# Patient Record
Sex: Male | Born: 1950 | Race: White | Hispanic: No | Marital: Married | Smoking: Former smoker
Health system: Southern US, Community
[De-identification: ages and names within clinical notes are randomized; demographics above are authoritative.]

## PROBLEM LIST (undated history)

## (undated) DIAGNOSIS — K219 Gastro-esophageal reflux disease without esophagitis: Secondary | ICD-10-CM

## (undated) DIAGNOSIS — M5126 Other intervertebral disc displacement, lumbar region: Secondary | ICD-10-CM

## (undated) DIAGNOSIS — G4730 Sleep apnea, unspecified: Secondary | ICD-10-CM

## (undated) DIAGNOSIS — I10 Essential (primary) hypertension: Secondary | ICD-10-CM

## (undated) DIAGNOSIS — Z8719 Personal history of other diseases of the digestive system: Secondary | ICD-10-CM

## (undated) DIAGNOSIS — M549 Dorsalgia, unspecified: Secondary | ICD-10-CM

## (undated) DIAGNOSIS — M159 Polyosteoarthritis, unspecified: Secondary | ICD-10-CM

## (undated) DIAGNOSIS — E785 Hyperlipidemia, unspecified: Secondary | ICD-10-CM

## (undated) DIAGNOSIS — C801 Malignant (primary) neoplasm, unspecified: Secondary | ICD-10-CM

## (undated) HISTORY — DX: Other intervertebral disc displacement, lumbar region: M51.26

## (undated) HISTORY — PX: PROSTATE BIOPSY: SHX241

## (undated) HISTORY — PX: APPENDECTOMY: SHX54

## (undated) HISTORY — PX: REPLACEMENT TOTAL KNEE: SUR1224

## (undated) HISTORY — DX: Polyosteoarthritis, unspecified: M15.9

## (undated) HISTORY — DX: Hyperlipidemia, unspecified: E78.5

## (undated) HISTORY — DX: Essential (primary) hypertension: I10

---

## 2000-09-25 HISTORY — PX: REPLACEMENT TOTAL KNEE: SUR1224

## 2002-06-12 ENCOUNTER — Encounter: Payer: Self-pay | Admitting: Orthopedic Surgery

## 2002-06-16 ENCOUNTER — Inpatient Hospital Stay (HOSPITAL_COMMUNITY): Admission: RE | Admit: 2002-06-16 | Discharge: 2002-06-18 | Payer: Self-pay | Admitting: Orthopedic Surgery

## 2003-06-29 ENCOUNTER — Inpatient Hospital Stay (HOSPITAL_COMMUNITY): Admission: EM | Admit: 2003-06-29 | Discharge: 2003-06-30 | Payer: Self-pay | Admitting: Emergency Medicine

## 2003-06-29 ENCOUNTER — Encounter: Payer: Self-pay | Admitting: *Deleted

## 2003-06-30 ENCOUNTER — Encounter: Payer: Self-pay | Admitting: *Deleted

## 2003-08-17 ENCOUNTER — Encounter (INDEPENDENT_AMBULATORY_CARE_PROVIDER_SITE_OTHER): Payer: Self-pay | Admitting: *Deleted

## 2003-08-17 ENCOUNTER — Ambulatory Visit (HOSPITAL_COMMUNITY): Admission: RE | Admit: 2003-08-17 | Discharge: 2003-08-17 | Payer: Self-pay | Admitting: *Deleted

## 2008-09-23 ENCOUNTER — Inpatient Hospital Stay (HOSPITAL_COMMUNITY): Admission: RE | Admit: 2008-09-23 | Discharge: 2008-09-25 | Payer: Self-pay | Admitting: Orthopedic Surgery

## 2009-08-09 ENCOUNTER — Ambulatory Visit (HOSPITAL_COMMUNITY): Admission: RE | Admit: 2009-08-09 | Discharge: 2009-08-09 | Payer: Self-pay | Admitting: Orthopedic Surgery

## 2009-08-12 ENCOUNTER — Ambulatory Visit: Admission: RE | Admit: 2009-08-12 | Discharge: 2009-08-12 | Payer: Self-pay | Admitting: Orthopedic Surgery

## 2009-08-30 ENCOUNTER — Inpatient Hospital Stay (HOSPITAL_COMMUNITY): Admission: RE | Admit: 2009-08-30 | Discharge: 2009-09-01 | Payer: Self-pay | Admitting: Orthopedic Surgery

## 2009-08-30 ENCOUNTER — Encounter (INDEPENDENT_AMBULATORY_CARE_PROVIDER_SITE_OTHER): Payer: Self-pay | Admitting: Orthopedic Surgery

## 2009-11-11 IMAGING — CR DG KNEE 1-2V PORT*R*
2 series · 2 of 2 positions shown · non-contrast
Comparison: None

CLINICAL DATA: Postop right knee replacement.

PORTABLE RIGHT KNEE - 1-2 VIEW

[view not recorded (1 of 2)]
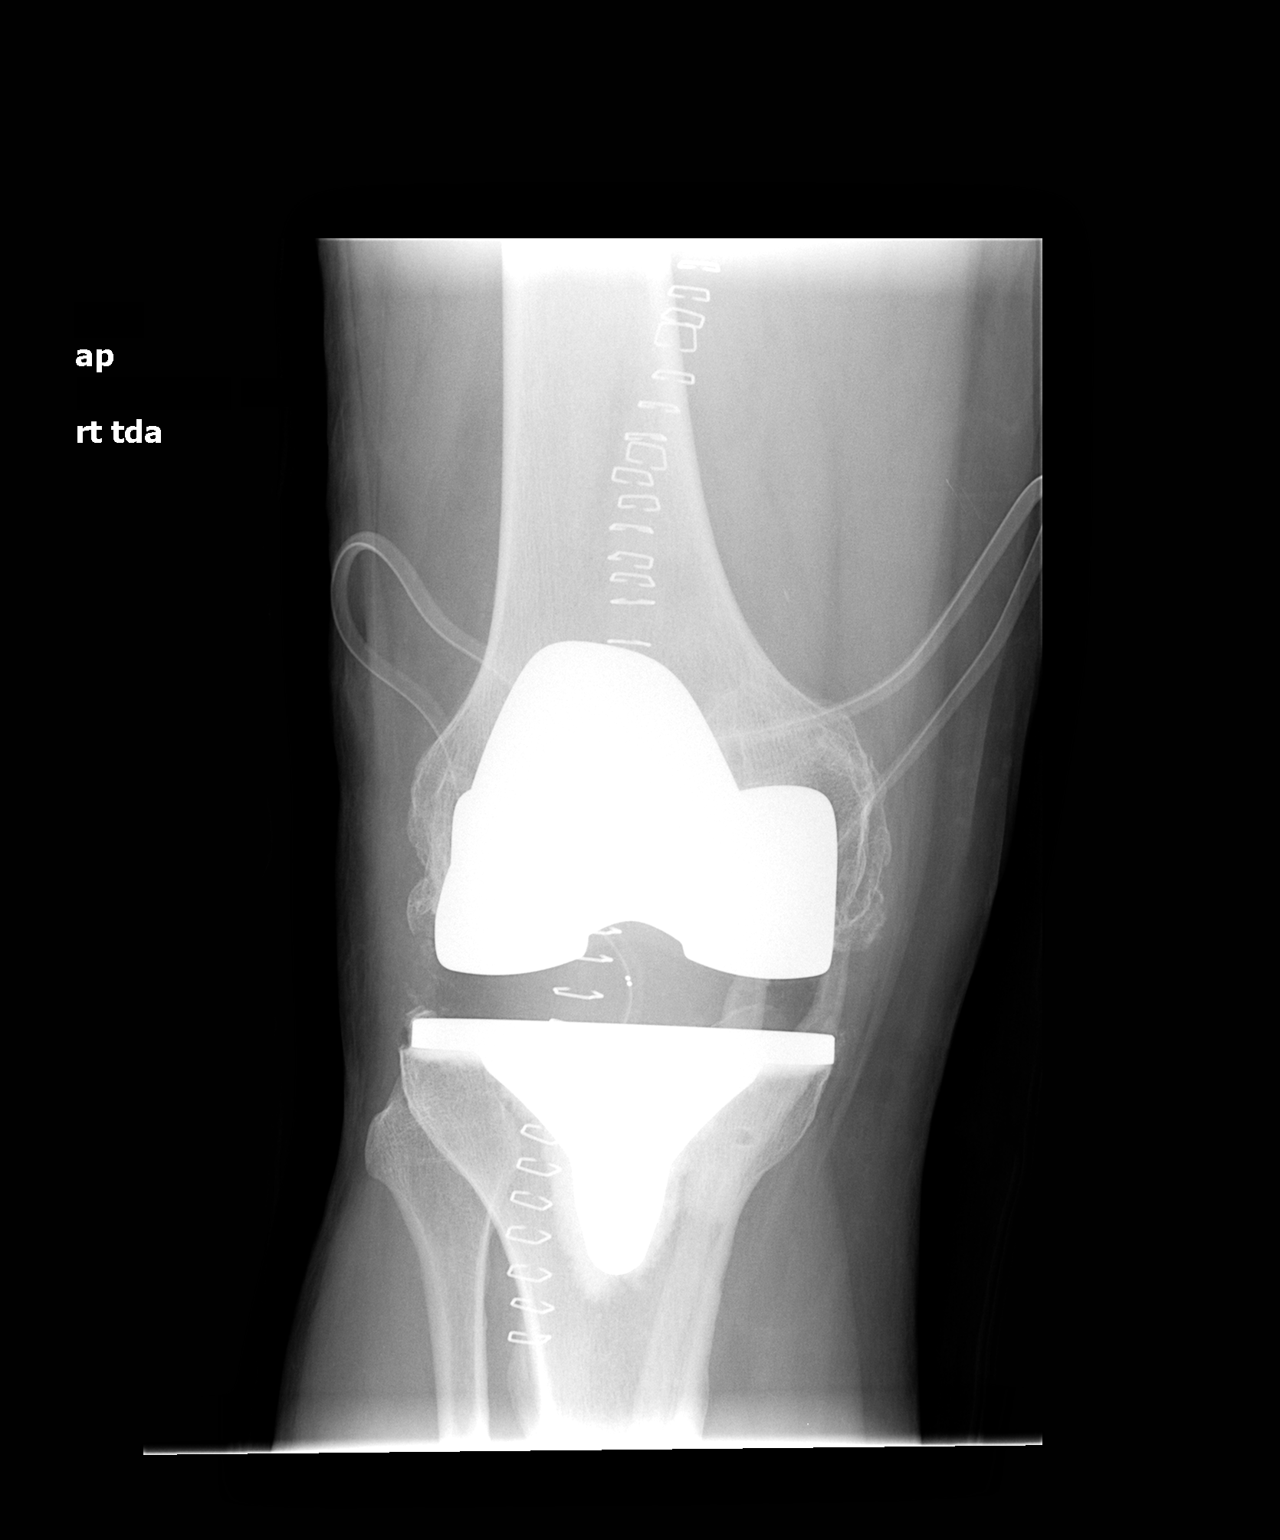

[view not recorded (2 of 2)]
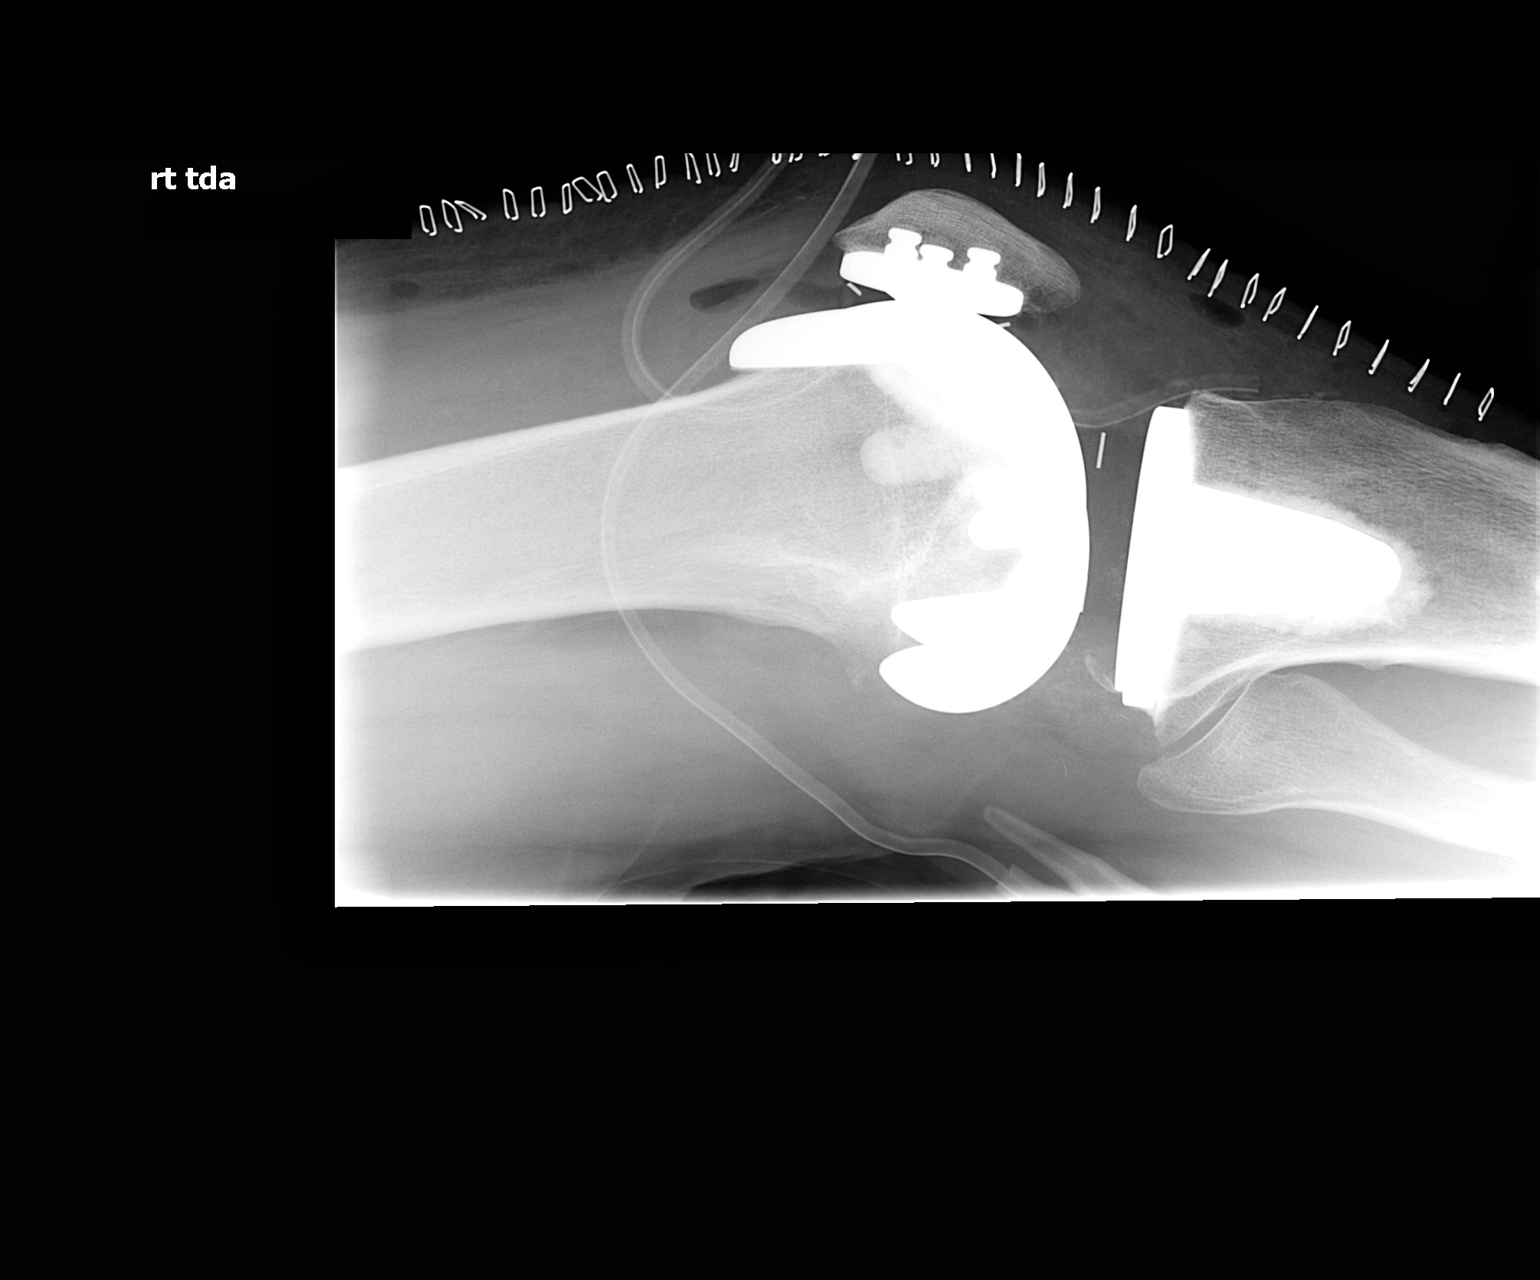

[2 of 2 positions shown; findings below may reference images not displayed]

FINDINGS: The patient is status post right total knee replacement.
No hardware or bony complicating feature.  Soft tissue drain in
place.  There is intrarticular gas noted.
IMPRESSION: Right knee replacement without complicating feature.

## 2009-11-11 IMAGING — CR DG CHEST 2V
2 series · 2 of 2 positions shown · non-contrast
Comparison: None

CLINICAL DATA: Osteoarthritis right knee, preop replacement.

CHEST - 2 VIEW

[view not recorded (1 of 2)]
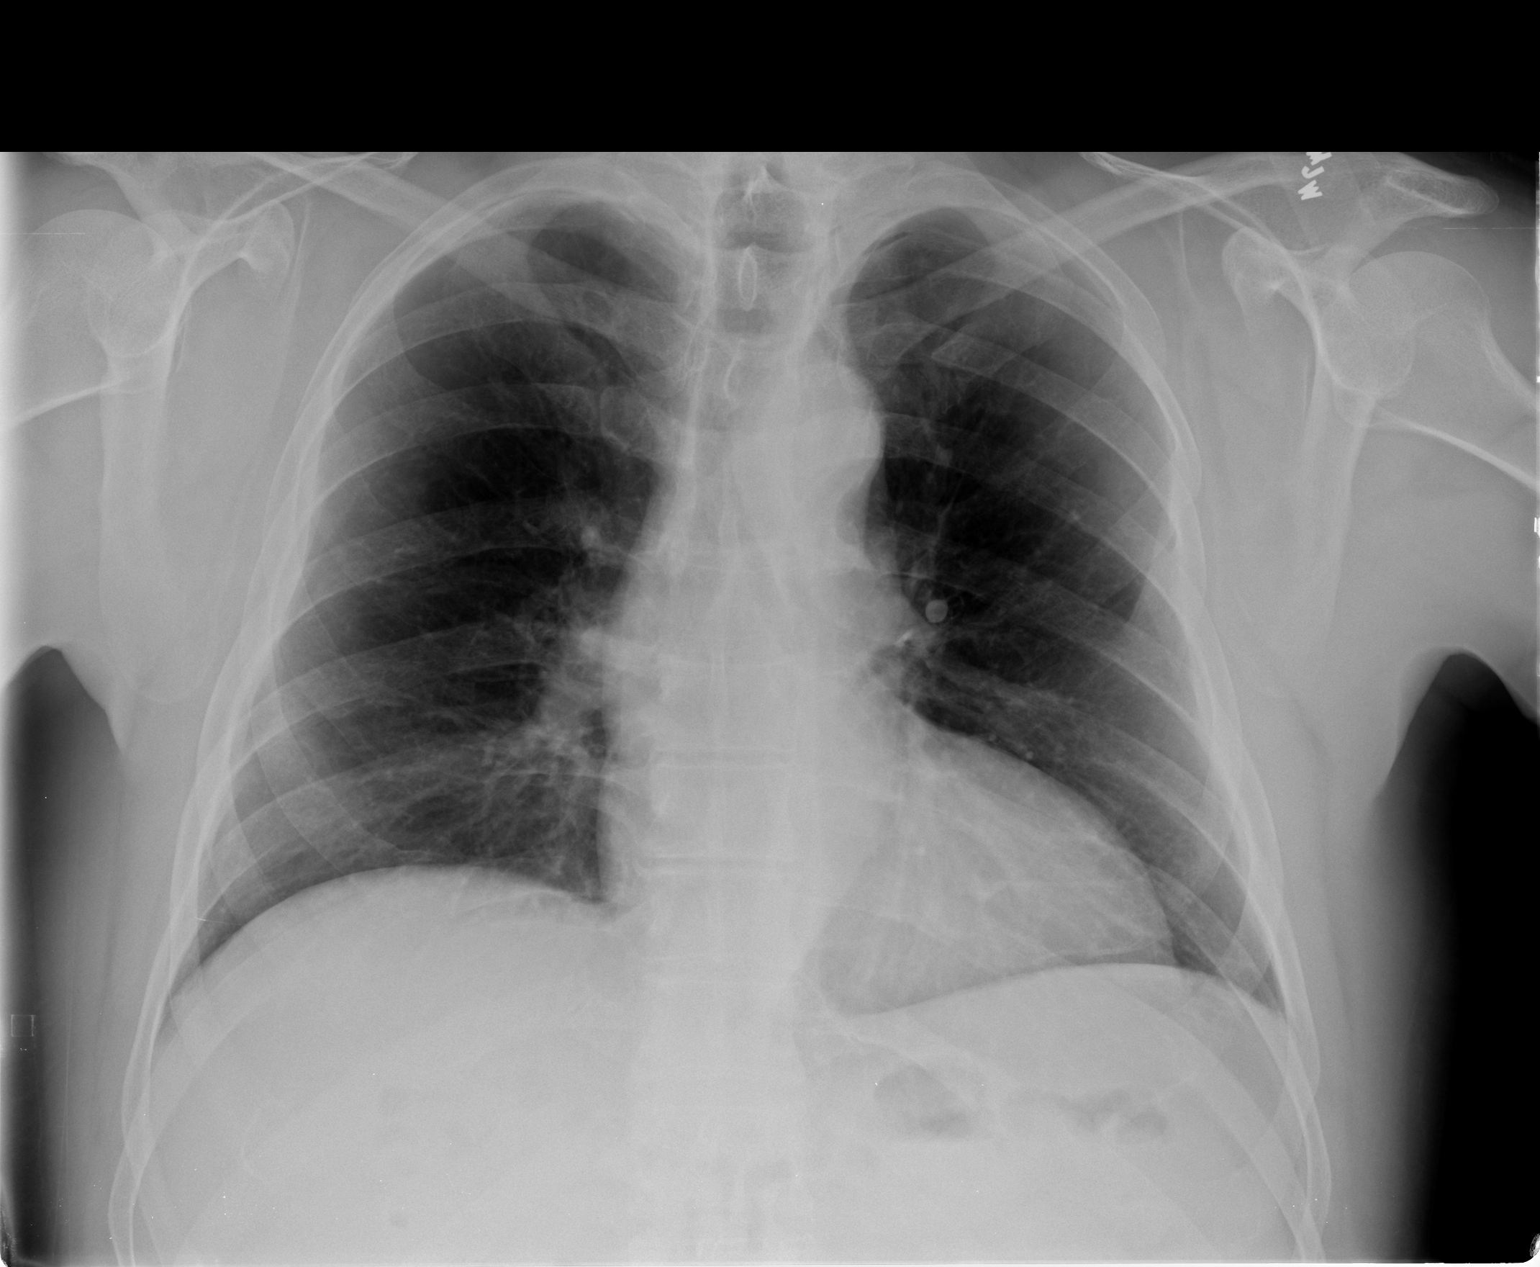

[view not recorded (2 of 2)]
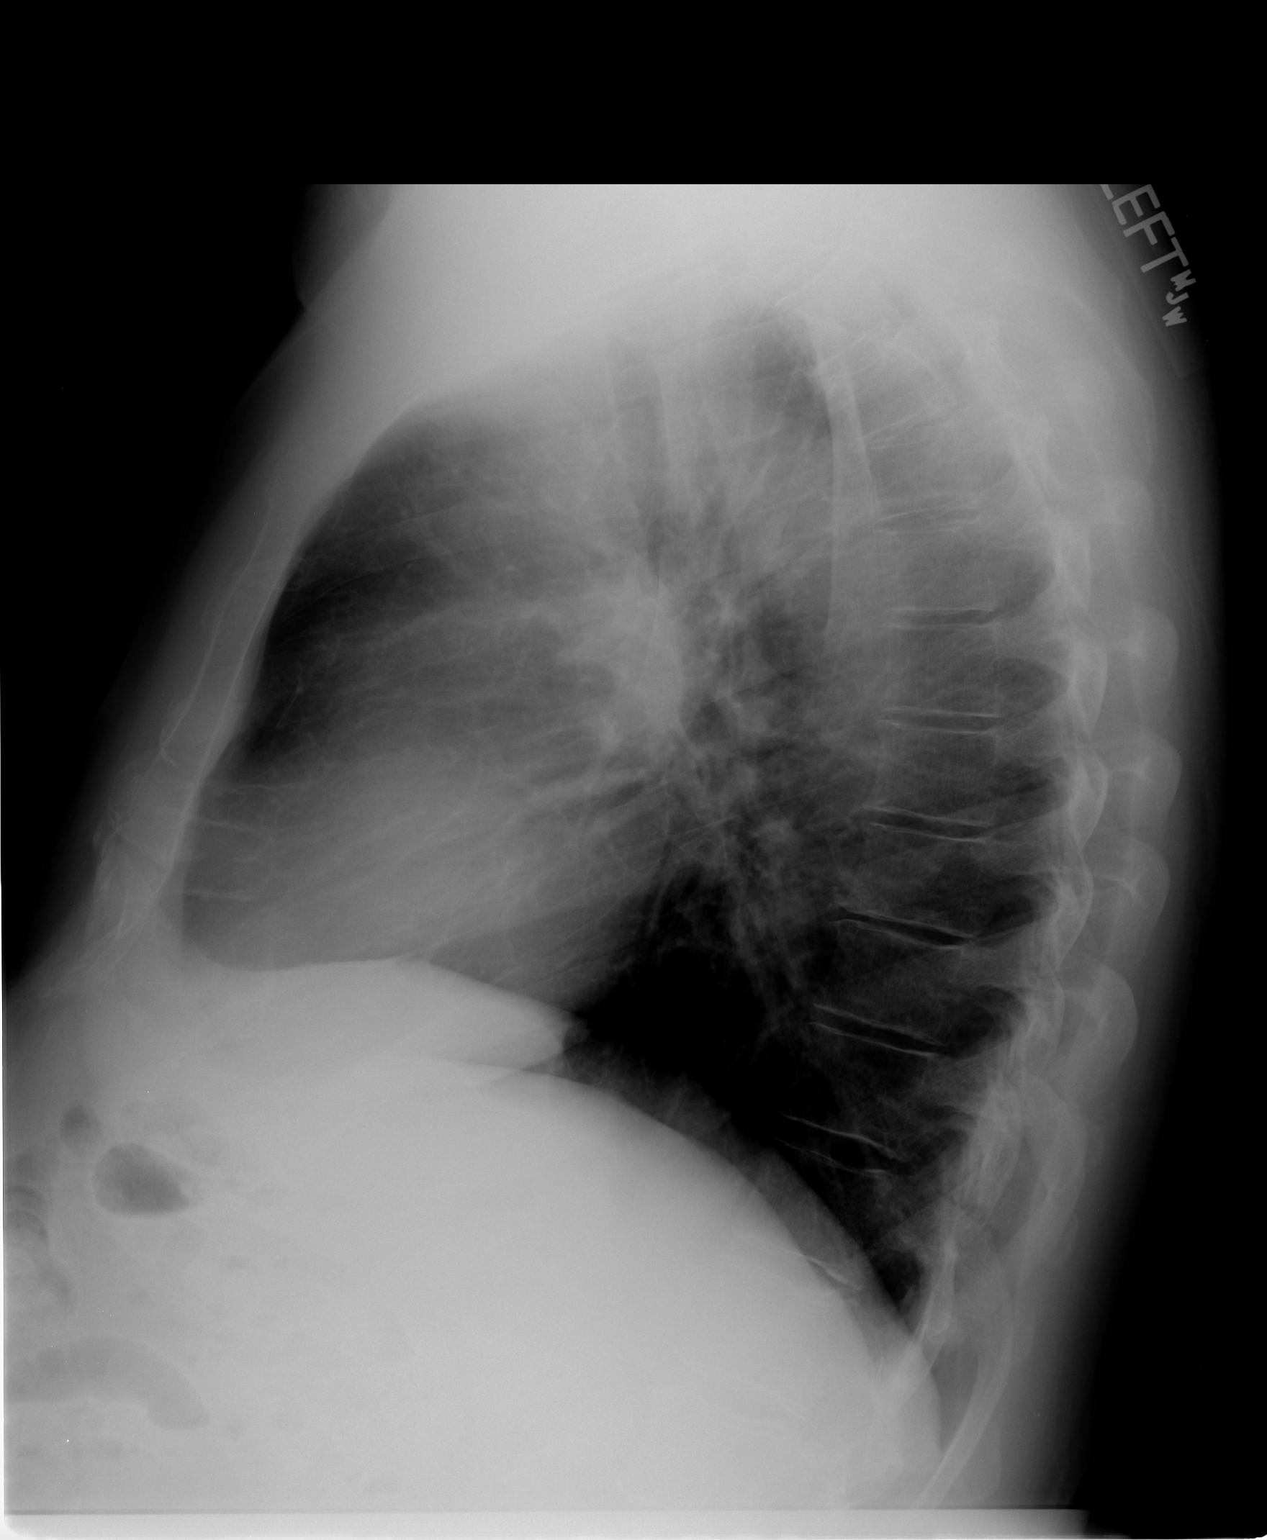

[2 of 2 positions shown; findings below may reference images not displayed]

FINDINGS: Heart and mediastinal contours are within normal limits.
No focal opacities or effusions.  No acute bony abnormality.
IMPRESSION: No active disease.

## 2010-09-25 HISTORY — PX: BASAL CELL CARCINOMA EXCISION: SHX1214

## 2010-12-27 LAB — COMPREHENSIVE METABOLIC PANEL
ALT: 44 U/L (ref 0–53)
AST: 30 U/L (ref 0–37)
Albumin: 4.3 g/dL (ref 3.5–5.2)
CO2: 29 mEq/L (ref 19–32)
Calcium: 9.6 mg/dL (ref 8.4–10.5)
GFR calc Af Amer: >60 mL/min (ref >60)
GFR calc non Af Amer: >60 mL/min (ref >60)
Sodium: 136 mEq/L (ref 135–145)
Total Protein: 7.3 g/dL (ref 6.0–8.3)

## 2010-12-27 LAB — CBC
Hemoglobin: 11.8 g/dL — ABNORMAL LOW (ref 13.0–17.0)
MCHC: 35.1 g/dL (ref 30.0–36.0)
MCHC: 35.8 g/dL (ref 30.0–36.0)
MCV: 87.1 fL (ref 78.0–100.0)
MCV: 88.3 fL (ref 78.0–100.0)
Platelets: 178 10*3/uL (ref 150–400)
Platelets: 217 10*3/uL (ref 150–400)
RBC: 3.78 MIL/uL — ABNORMAL LOW (ref 4.22–5.81)
RBC: 3.91 MIL/uL — ABNORMAL LOW (ref 4.22–5.81)
RBC: 5.41 MIL/uL (ref 4.22–5.81)
WBC: 8 10*3/uL (ref 4.0–10.5)
WBC: 8.3 10*3/uL (ref 4.0–10.5)

## 2010-12-27 LAB — URINE CULTURE: Culture: NO GROWTH

## 2010-12-27 LAB — BODY FLUID CULTURE: Culture: NO GROWTH

## 2010-12-27 LAB — URINALYSIS, ROUTINE W REFLEX MICROSCOPIC
Glucose, UA: NEGATIVE mg/dL
Hgb urine dipstick: NEGATIVE
Specific Gravity, Urine: 1.016 (ref 1.005–1.030)

## 2010-12-27 LAB — TISSUE CULTURE: Culture: NO GROWTH

## 2010-12-27 LAB — ANAEROBIC CULTURE

## 2010-12-27 LAB — DIFFERENTIAL
Eosinophils Absolute: 0.2 10*3/uL (ref 0.0–0.7)
Eosinophils Relative: 2 % (ref 0–5)
Lymphs Abs: 1.5 10*3/uL (ref 0.7–4.0)
Monocytes Absolute: 0.6 10*3/uL (ref 0.1–1.0)
Monocytes Relative: 8 % (ref 3–12)

## 2010-12-27 LAB — CROSSMATCH

## 2010-12-27 LAB — BASIC METABOLIC PANEL
BUN: 11 mg/dL (ref 6–23)
CO2: 30 mEq/L (ref 19–32)
Calcium: 8.2 mg/dL — ABNORMAL LOW (ref 8.4–10.5)
Chloride: 98 mEq/L (ref 96–112)
Chloride: 99 mEq/L (ref 96–112)
Creatinine, Ser: 1.01 mg/dL (ref 0.4–1.5)
Creatinine, Ser: 1.19 mg/dL (ref 0.4–1.5)
GFR calc Af Amer: >60 mL/min (ref >60)
GFR calc Af Amer: >60 mL/min (ref >60)
GFR calc non Af Amer: >60 mL/min (ref >60)
Sodium: 136 mEq/L (ref 135–145)

## 2010-12-27 LAB — SYNOVIAL CELL COUNT + DIFF, W/ CRYSTALS
Crystals, Fluid: NONE SEEN
Lymphocytes-Synovial Fld: 26 % — ABNORMAL HIGH (ref 0–20)

## 2010-12-27 LAB — GRAM STAIN

## 2010-12-28 LAB — URINALYSIS, ROUTINE W REFLEX MICROSCOPIC
Ketones, ur: NEGATIVE mg/dL
Nitrite: NEGATIVE
Protein, ur: NEGATIVE mg/dL
Urobilinogen, UA: 0.2 mg/dL (ref 0.0–1.0)

## 2010-12-28 LAB — PROTIME-INR
INR: 0.99 (ref 0.00–1.49)
Prothrombin Time: 13 seconds (ref 11.6–15.2)

## 2010-12-28 LAB — COMPREHENSIVE METABOLIC PANEL
BUN: 9 mg/dL (ref 6–23)
CO2: 27 mEq/L (ref 19–32)
Calcium: 9.6 mg/dL (ref 8.4–10.5)
Chloride: 102 mEq/L (ref 96–112)
Creatinine, Ser: 1 mg/dL (ref 0.4–1.5)
GFR calc non Af Amer: >60 mL/min (ref >60)
Total Bilirubin: 0.8 mg/dL (ref 0.3–1.2)

## 2010-12-28 LAB — DIFFERENTIAL
Basophils Absolute: 0 10*3/uL (ref 0.0–0.1)
Lymphocytes Relative: 21 % (ref 12–46)
Lymphs Abs: 1.1 10*3/uL (ref 0.7–4.0)
Neutro Abs: 3.7 10*3/uL (ref 1.7–7.7)

## 2010-12-28 LAB — CBC
HCT: 46 % (ref 39.0–52.0)
MCHC: 35.5 g/dL (ref 30.0–36.0)
MCV: 87.5 fL (ref 78.0–100.0)
Platelets: 178 10*3/uL (ref 150–400)
RBC: 5.26 MIL/uL (ref 4.22–5.81)
WBC: 5.5 10*3/uL (ref 4.0–10.5)

## 2010-12-28 LAB — URINE CULTURE: Culture: NO GROWTH

## 2010-12-28 LAB — APTT: aPTT: 27 seconds (ref 24–37)

## 2011-01-09 LAB — CBC
Platelets: 171 10*3/uL (ref 150–400)
WBC: 10.4 10*3/uL (ref 4.0–10.5)

## 2011-01-09 LAB — BASIC METABOLIC PANEL
BUN: 17 mg/dL (ref 6–23)
Calcium: 8.2 mg/dL — ABNORMAL LOW (ref 8.4–10.5)
Creatinine, Ser: 1.06 mg/dL (ref 0.4–1.5)
GFR calc non Af Amer: >60 mL/min (ref >60)

## 2011-02-07 NOTE — Op Note (Signed)
NAME:  ROBIE, MCNIEL NO.:  [id number]   MEDICAL RECORD NO.:  [id number]          PATIENT TYPE:  INP   LOCATION:  5007                         FACILITY:  [facility]   PHYSICIAN:  Dyke Brackett, M.D.    DATE OF BIRTH:  Jun 26, 1951   DATE OF PROCEDURE:  09/23/2008  DATE OF DISCHARGE:                               OPERATIVE REPORT   INDICATIONS:  This is a 60 year old with known severe osteoarthritis of  the right knee, not responded to conservative treatment thought to be  amenable to total knee replacement.   PREOPERATIVE DIAGNOSIS:  Osteoarthritis, right knee.   POSTOPERATIVE DIAGNOSIS:  Osteoarthritis, right knee.   OPERATION:  Right total knee replacement, (LCS cemented standard plus  femur patella, size-4 tibia, and 12.5-mm bearing).   SURGEON:  Dyke Brackett, MD   ASSISTANT:  Felicie Morn, PA-C   TOURNIQUET TIME:  One hour 20 minutes.   PROCEDURE:  Sterile prep and drape, exsanguination of the leg, placed in  350, straight skin incision, medial approach of the knee made.  Stripping of the medial side with varus deformity noted and flexion  contracture 5 mm below the most __________ compartment.  The cut was  made directly into the appropriate amount of valgus followed by an  anterior-posterior femoral cut to have the flexion gap measured at 12.5  and a 4-degree distal valgus cut.  Finishing guide was used with  excessive bone removed from the posterior aspect of the knee completely  released with PCL, ACL as well as excision and resection of both  menisci.  Attention was next directed at the tibia, key hole was cut up  to size 4 tibia, was sized followed by trial tibia and femur with the  12.5-mm bearing and cutting leaving 14 mm of native patella for 3 peg  patella trial.  All trials were placed.  Reduction was carried out at  the tendon and 12.5-mm bearing, the 12.5 provided the most stability  relative to still obtaining full extension with no tendency to  bearing  spin-out.  Trials removed.  Final components were inserted that the  cement was placed with 2 bags of cement each with 1 g of vancomycin,  this was allowed to harden.  Excess cement was removed.  A trial bearing  was next placed, after that trial reduction was again carried out with  the trial bearing followed by removal of the trial bearing.  The  tourniquet was then released.  Small bleeders were coagulated.  There  was no excessive  bleeding from the posterior aspect of the knee or any posterior cement.  Closure was was effected with #1 Ethibond and 2-0 Vicryl skin clips.  Marcaine was not used, I believe due to the fact that the patient had a  femoral nerve block, taken to recovery room in stable condition.       Dyke Brackett, M.D.  Electronically Signed     WDC/MEDQ  D:  09/23/2008  T:  09/24/2008  Job:  [id number]
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
## 2011-06-29 LAB — TYPE AND SCREEN
ABO/RH(D): A POS
Antibody Screen: NEGATIVE

## 2011-06-29 LAB — COMPREHENSIVE METABOLIC PANEL
ALT: 31 U/L (ref 0–53)
Albumin: 4.2 g/dL (ref 3.5–5.2)
Alkaline Phosphatase: 69 U/L (ref 39–117)
BUN: 11 mg/dL (ref 6–23)
Chloride: 100 mEq/L (ref 96–112)
Glucose, Bld: 106 mg/dL — ABNORMAL HIGH (ref 70–99)
Potassium: 3.8 mEq/L (ref 3.5–5.1)
Sodium: 138 mEq/L (ref 135–145)
Total Bilirubin: 0.6 mg/dL (ref 0.3–1.2)

## 2011-06-29 LAB — APTT: aPTT: 26 seconds (ref 24–37)

## 2011-06-29 LAB — CBC
HCT: 48.4 % (ref 39.0–52.0)
Hemoglobin: 16.2 g/dL (ref 13.0–17.0)
MCHC: 35.2 g/dL (ref 30.0–36.0)
MCV: 85 fL (ref 78.0–100.0)
RBC: 4.86 MIL/uL (ref 4.22–5.81)
RDW: 12.6 % (ref 11.5–15.5)
WBC: 6.8 10*3/uL (ref 4.0–10.5)

## 2011-06-29 LAB — URINE CULTURE
Colony Count: NO GROWTH
Culture: NO GROWTH

## 2011-06-29 LAB — BASIC METABOLIC PANEL
BUN: 23 mg/dL (ref 6–23)
CO2: 27 mEq/L (ref 19–32)
Calcium: 8.4 mg/dL (ref 8.4–10.5)
Chloride: 99 mEq/L (ref 96–112)
Creatinine, Ser: 1.63 mg/dL — ABNORMAL HIGH (ref 0.4–1.5)
Glucose, Bld: 144 mg/dL — ABNORMAL HIGH (ref 70–99)

## 2011-06-29 LAB — URINALYSIS, ROUTINE W REFLEX MICROSCOPIC
Bilirubin Urine: NEGATIVE
Glucose, UA: NEGATIVE mg/dL
Hgb urine dipstick: NEGATIVE
Ketones, ur: NEGATIVE mg/dL
Nitrite: NEGATIVE
Specific Gravity, Urine: 1.016 (ref 1.005–1.030)
pH: 6.5 (ref 5.0–8.0)

## 2011-06-29 LAB — DIFFERENTIAL
Basophils Absolute: 0 10*3/uL (ref 0.0–0.1)
Basophils Relative: 1 % (ref 0–1)
Eosinophils Absolute: 0.3 10*3/uL (ref 0.0–0.7)
Monocytes Absolute: 0.5 10*3/uL (ref 0.1–1.0)
Neutro Abs: 4.4 10*3/uL (ref 1.7–7.7)
Neutrophils Relative %: 65 % (ref 43–77)

## 2011-07-26 ENCOUNTER — Ambulatory Visit (INDEPENDENT_AMBULATORY_CARE_PROVIDER_SITE_OTHER): Payer: PRIVATE HEALTH INSURANCE | Admitting: Cardiovascular Disease

## 2011-07-26 ENCOUNTER — Encounter: Payer: Self-pay | Admitting: Cardiovascular Disease

## 2011-07-26 VITALS — BP 120/80 | HR 61 | Ht 70.0 in | Wt 203.0 lb

## 2011-07-26 DIAGNOSIS — I10 Essential (primary) hypertension: Secondary | ICD-10-CM

## 2011-07-26 DIAGNOSIS — R0602 Shortness of breath: Secondary | ICD-10-CM

## 2011-07-26 DIAGNOSIS — R400 Somnolence: Secondary | ICD-10-CM | POA: Insufficient documentation

## 2011-07-26 DIAGNOSIS — R404 Transient alteration of awareness: Secondary | ICD-10-CM

## 2011-07-26 MED ORDER — FUROSEMIDE 20 MG PO TABS: 20.0000 mg | ORAL_TABLET | Freq: Two times a day (BID) | ORAL | Status: DC | PRN

## 2011-07-26 MED ORDER — HYDROCHLOROTHIAZIDE 25 MG PO TABS: 25.0000 mg | ORAL_TABLET | Freq: Every day | ORAL | Status: DC

## 2011-07-26 MED ORDER — FUROSEMIDE 20 MG PO TABS: 20.0000 mg | ORAL_TABLET | Freq: Two times a day (BID) | ORAL | Status: DC

## 2011-07-26 NOTE — Assessment & Plan Note (Signed)
He has poor sleep hygiene. His wife keeps him awake as she has significant snoring problem and does not use her CPAP. He also snores and the dogs keep him awake. We have suggested that his wife tried to restart using her CPAP and a possibly put the dogs elsewhere for sleeping.

## 2011-07-26 NOTE — Assessment & Plan Note (Signed)
He has numerous reasons for mildly elevated blood pressures. He has had recent weight gain, he could have possible sleep apnea, he uses significant salt on most of his food, eats out at least 3 times per week. I'm concerned about some excess salt intake and fluid retention. He believes that since he stopped HCTZ his weight has significantly increased. This could potentially be secondary to his high fluid intake and salt intake.  We have suggested he restart HCTZ 25 mg daily. We have even suggest he take furosemide 20 mg a day after he goes out to dinner. I've asked him to minimize the salt on his food and use a salt substitute such as Mrs. Dash. Rest and to monitor his blood pressure and heart rate at home over the next several weeks. I suggested he stay on losartan and verapamil for now.

## 2011-07-26 NOTE — Assessment & Plan Note (Signed)
Shortness of breath could be from mild fluid overload from IV fluid intake and salt intake as well as mild weight gain. We will try to improve his symptoms of shortness of breath with gentle diuresis.

## 2011-07-26 NOTE — Patient Instructions (Signed)
You are doing well.  Please start HCTZ one a day Take lasix one or two the day after you eat out No adding extra salt   Please call us if you have new issues that need to be addressed before your next appt.  The office will contact you for a follow up Appt. In 1 months

## 2011-07-26 NOTE — Progress Notes (Signed)
Patient ID: Jerry Welch, male    DOB: Dec 30, 1950, 60 y.o.   MRN: [id number]  HPI Comments: Pleasant 60 year old gentleman, patient of Dr. Burnett Sheng, with history of mild obesity, hypertension, migraines and osteoarthritis who presents for evaluation of his hypertension and shortness of breath.  He reports that he has tried several medications though they seem to work sometimes in his blood pressure is very labile. He is currently on verapamil 240 mg daily with losartan 100 mg. Amlodipine was discontinued and atenolol was discontinued. He also reports not being on HCTZ.  He states that his weight is up recently, he eats out frequently at least 3 times per week. He put salt on most of his food. 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 His wife has significant obstructive sleep apnea and does not use her CPAP. This seems to keep him awake at night. He does have some snoring and it is uncertain how bad this is. He did have phase I surgery for sleep apnea many years ago. The dogs also wake him up at night time and he uses this opportunity to go to the bathroom.  In the past he has tried bystolic 10 mg and 20 mg though he remembers that it did not seem to work for a while  EKG shows normal sinus rhythm with rate 61 beats per minute with poor R-wave progression through the precordial leads, no significant ST or T wave changes   Outpatient Encounter Prescriptions as of 07/26/2011  Medication Sig Dispense Refill  . losartan (COZAAR) 100 MG tablet Take 100 mg by mouth daily.        . naproxen sodium (ANAPROX) 220 MG tablet Take 220 mg by mouth 2 (two) times daily with a meal.        . omeprazole (PRILOSEC) 20 MG capsule Take 20 mg by mouth daily.        . verapamil (COVERA HS) 240 MG (CO) 24 hr tablet Take 240 mg by mouth at bedtime.         Review of Systems  Constitutional: Positive for fatigue.  HENT:  Negative.   Eyes: Negative.   Respiratory: Positive for shortness of breath.   Cardiovascular: Negative.   Gastrointestinal: Negative.   Musculoskeletal: Negative.   Skin: Negative.   Neurological: Negative.   Hematological: Negative.   Psychiatric/Behavioral: Positive for sleep disturbance.  All other systems reviewed and are negative.    BP 120/80  Pulse 61  Ht 5\' 10"  (1.778 m)  Wt 203 lb (92.08 kg)  BMI 29.13 kg/m2   Physical Exam  Nursing note and vitals reviewed. Constitutional: He is oriented to person, place, and time. He appears well-developed and well-nourished.  HENT:  Head: Normocephalic.  Nose: Nose normal.  Mouth/Throat: Oropharynx is clear and moist.  Eyes: Conjunctivae are normal. Pupils are equal, round, and reactive to light.  Neck: Normal range of motion. Neck supple. No JVD present.  Cardiovascular: Normal rate, regular rhythm, S1 normal, S2 normal, normal heart sounds and intact distal pulses.  Exam reveals no gallop and no friction rub.   No murmur heard. Pulmonary/Chest: Effort normal and breath sounds normal. No respiratory distress. He has no wheezes. He has no rales. He exhibits no tenderness.  Abdominal: Soft. Bowel sounds are normal. He exhibits no distension. There is no tenderness.  Musculoskeletal: Normal range of motion. He exhibits no edema and no tenderness.  Lymphadenopathy:  He has no cervical adenopathy.  Neurological: He is alert and oriented to person, place, and time. Coordination normal.  Skin: Skin is warm and dry. No rash noted. No erythema.  Psychiatric: He has a normal mood and affect. His behavior is normal. Judgment and thought content normal.           Assessment and Plan

## 2011-08-25 ENCOUNTER — Encounter: Payer: Self-pay | Admitting: Cardiovascular Disease

## 2011-08-25 ENCOUNTER — Ambulatory Visit (INDEPENDENT_AMBULATORY_CARE_PROVIDER_SITE_OTHER): Payer: PRIVATE HEALTH INSURANCE | Admitting: Cardiovascular Disease

## 2011-08-25 DIAGNOSIS — R0602 Shortness of breath: Secondary | ICD-10-CM

## 2011-08-25 DIAGNOSIS — I10 Essential (primary) hypertension: Secondary | ICD-10-CM

## 2011-08-25 DIAGNOSIS — R404 Transient alteration of awareness: Secondary | ICD-10-CM

## 2011-08-25 DIAGNOSIS — R400 Somnolence: Secondary | ICD-10-CM

## 2011-08-25 NOTE — Assessment & Plan Note (Signed)
Blood pressure is significantly improved and he feels well. We have suggested he stay on his current medication regimen. If he does have spikes in his blood pressure, he could take and atenolol tablet or extra HCTZ.

## 2011-08-25 NOTE — Assessment & Plan Note (Signed)
He reports that her energy and less fatigue and improved sleep with his Breathe Right nasal strips.

## 2011-08-25 NOTE — Patient Instructions (Signed)
You are doing well. No medication changes were made CAll the office if you have worsening leg swelling Please call us if you have new issues that need to be addressed before your next appt.

## 2011-08-25 NOTE — Progress Notes (Signed)
Patient ID: Jerry Welch, male    DOB: 1951-04-06, 60 y.o.   MRN: [id number]  HPI Comments: Pleasant 59 year old gentleman, patient of Dr. Burnett Sheng, with history of mild obesity, hypertension, migraines and osteoarthritis who presents for follow up of his hypertension and shortness of breath.  Since we have last seen him, he has been more strict with his salt intake, he has been taking verapamil one half tab in the morning and a full tablet at night, with HCTZ and losartan. In general he has been happy with his blood pressures. For the most part, they are running in the 120-130 range systolic. He does have very rare blood pressure in the 150-160 range. He does have mild lower extremity edema though this is not too bothersome to him and is better than amlodipine.  He did pick up some atenolol from the pharmacy by accident Though is not taking this.   He has been using Breathe RightNasal strips for his snoring and sleeps well. He is now going to work on his weight.    Outpatient Encounter Prescriptions as of 08/25/2011  Medication Sig Dispense Refill  . furosemide (LASIX) 20 MG tablet Take 1 tablet (20 mg total) by mouth 2 (two) times daily as needed.  60 tablet  11  . hydrochlorothiazide (HYDRODIURIL) 25 MG tablet Take 1 tablet (25 mg total) by mouth daily.  90 tablet  4  . losartan (COZAAR) 100 MG tablet Take 100 mg by mouth daily.        . naproxen sodium (ANAPROX) 220 MG tablet Take 220 mg by mouth 2 (two) times daily with a meal.        . omeprazole (PRILOSEC) 20 MG capsule Take 20 mg by mouth daily.        . verapamil (COVERA HS) 240 MG (CO) 24 hr tablet Take one-half tablet by mouth every am & one tablet by mouth every pm.        Review of Systems  HENT: Negative.   Eyes: Negative.   Cardiovascular: Negative.   Gastrointestinal: Negative.   Musculoskeletal: Negative.   Skin: Negative.   Neurological: Negative.   Hematological: Negative.   Psychiatric/Behavioral: Positive  for sleep disturbance.  All other systems reviewed and are negative.    BP 110/90  Pulse 86  Ht 5\' 10"  (1.778 m)  Wt 203 lb (92.08 kg)  BMI 29.13 kg/m2   Physical Exam  Nursing note and vitals reviewed. Constitutional: He is oriented to person, place, and time. He appears well-developed and well-nourished.  HENT:  Head: Normocephalic.  Nose: Nose normal.  Mouth/Throat: Oropharynx is clear and moist.  Eyes: Conjunctivae are normal. Pupils are equal, round, and reactive to light.  Neck: Normal range of motion. Neck supple. No JVD present.  Cardiovascular: Normal rate, regular rhythm, S1 normal, S2 normal, normal heart sounds and intact distal pulses.  Exam reveals no gallop and no friction rub.   No murmur heard. Pulmonary/Chest: Effort normal and breath sounds normal. No respiratory distress. He has no wheezes. He has no rales. He exhibits no tenderness.  Abdominal: Soft. Bowel sounds are normal. He exhibits no distension. There is no tenderness.  Musculoskeletal: Normal range of motion. He exhibits no edema and no tenderness.  Lymphadenopathy:    He has no cervical adenopathy.  Neurological: He is alert and oriented to person, place, and time. Coordination normal.  Skin: Skin is warm and dry. No rash noted. No erythema.  Psychiatric: He has a normal mood and  affect. His behavior is normal. Judgment and thought content normal.           Assessment and Plan

## 2011-08-25 NOTE — Assessment & Plan Note (Signed)
Shortness of breath has improved with better blood pressure control and less salt intake.

## 2012-05-01 ENCOUNTER — Ambulatory Visit (INDEPENDENT_AMBULATORY_CARE_PROVIDER_SITE_OTHER): Payer: PRIVATE HEALTH INSURANCE

## 2012-05-01 ENCOUNTER — Telehealth: Payer: Self-pay

## 2012-05-01 VITALS — BP 130/80 | HR 60 | Ht 70.0 in | Wt 200.0 lb

## 2012-05-01 DIAGNOSIS — I10 Essential (primary) hypertension: Secondary | ICD-10-CM

## 2012-05-01 DIAGNOSIS — R0602 Shortness of breath: Secondary | ICD-10-CM

## 2012-05-01 NOTE — Patient Instructions (Addendum)
F/u with PCP today at 2pm

## 2012-05-01 NOTE — Telephone Encounter (Signed)
Pt's wife called. She says Sunday (8/4), pt's BP "shot up" and pt became very dizzy, nauseous. Wife does not recall the actual BP reading.  She says pt "doubled up" on BP meds , stayed out of work Monday 8/5 d/t feeling poorly and finally went back to work yesterday 8/6. She says BP came back to "normal". She says pt works in [employer] (1 hour away) and he just called wife to say he is trying to drive home from work. Says he has a "splitting headache", blurred vision and BP has "shot up again". She asks what he needs to do.  I advised to have him pull over on side of road and call 911. He may be having a CVA/TIA or other issue that needs immediate attention. I explained I do not feel comfortable telling him to come here, driving, with blurred vision 1hour away.  She verb. Understanding and will call pt to tell him this then will call me back.  Wife called back to say she spoke with pt and he says he is only 20 mins away and does not want to go to ER/call 911. Advised against this but told her I would be glad to check his BP and assess.  She verb. Understanding and will have pt come in ASAP.

## 2012-05-01 NOTE — Progress Notes (Signed)
Pt here with c/o sudden onset of blurred vision and severe h/a. He says BP was elevated this am 140/101. Today in office BP=130/80, HR=60 BPM. He denies numbness, tingling. No slurred speech. He confirms compliance with medications Mentions getting bit by a tick last week, wonders if this is associated.   I paged Dr. Mariah Milling who called back and suggests he go to PCP now. If they cannot see him right away, he should go to urgent care.  I called PCP who says NP can see him today at 2 pm.  His daughter is here with him and will drive him to appointment now. Pt was able to walk out of office without any assist.

## 2012-05-30 ENCOUNTER — Ambulatory Visit: Payer: Self-pay | Admitting: Family Medicine

## 2012-07-30 ENCOUNTER — Other Ambulatory Visit: Payer: Self-pay | Admitting: Gastroenterology

## 2012-07-30 LAB — CLOSTRIDIUM DIFFICILE BY PCR

## 2012-09-05 ENCOUNTER — Other Ambulatory Visit: Payer: Self-pay | Admitting: Cardiovascular Disease

## 2012-09-05 NOTE — Telephone Encounter (Signed)
Lmtcb Pt is due for a 1 yr follow up needs to make appointment. Refilled Furosemide Refill:1.

## 2012-09-13 ENCOUNTER — Ambulatory Visit: Payer: Self-pay | Admitting: Gastroenterology

## 2012-09-30 ENCOUNTER — Encounter: Payer: Self-pay | Admitting: Cardiovascular Disease

## 2012-09-30 ENCOUNTER — Ambulatory Visit (INDEPENDENT_AMBULATORY_CARE_PROVIDER_SITE_OTHER): Payer: 59 | Admitting: Cardiovascular Disease

## 2012-09-30 VITALS — BP 116/82 | HR 69 | Ht 70.0 in | Wt 208.2 lb

## 2012-09-30 DIAGNOSIS — R0602 Shortness of breath: Secondary | ICD-10-CM

## 2012-09-30 DIAGNOSIS — I10 Essential (primary) hypertension: Secondary | ICD-10-CM

## 2012-09-30 NOTE — Assessment & Plan Note (Signed)
Minimal symptoms of shortness of breath. We have encouraged him to start a regular exercise program.

## 2012-09-30 NOTE — Patient Instructions (Addendum)
You are doing well. No medication changes were made.  Please call us if you have new issues that need to be addressed before your next appt.  Your physician wants you to follow-up in: 12 months.  You will receive a reminder letter in the mail two months in advance. If you don't receive a letter, please call our office to schedule the follow-up appointment. 

## 2012-09-30 NOTE — Assessment & Plan Note (Signed)
Blood pressure is well controlled on today's visit. No changes made to the medications. 

## 2012-09-30 NOTE — Progress Notes (Signed)
Patient ID: Jerry Welch, male    DOB: 1951-09-22, 62 y.o.   MRN: [id number]  HPI Comments: Pleasant 62 year old gentleman, patient of Dr. Burnett Sheng, with history of mild obesity, hypertension, migraines and osteoarthritis who presents for follow up of his hypertension,  and shortness of breath.  He reports that he is doing well. He is changing jobs today and after being at the same job for 28 years, he is ready to give up his stressful work experience and change to a less stressful job. He is very excited. He reports his blood pressure has been well controlled on his current medications with no complications. His weight is up slightly. On his new job and when he retires, he would like to workout more in an effort to lose weight. No significant edema.  EKG shows normal sinus rhythm with rate 69 beats per minute with poor R-wave progression through the anterior precordial leads ultrasound September 2013 showing hepatic steatohepatitis,     Recent lab work by Dr. Burnett Sheng August 2013 shows total cholesterol 154, LDL 87, HDL 40    Outpatient Encounter Prescriptions as of 09/30/2012  Medication Sig Dispense Refill  . furosemide (LASIX) 20 MG tablet TAKE ONE TABLET BY MOUTH TWICE DAILY AS NEEDED  60 tablet  1  . losartan (COZAAR) 100 MG tablet Take 100 mg by mouth daily.        . naproxen sodium (ANAPROX) 220 MG tablet Take 220 mg by mouth 2 (two) times daily with a meal.        . omeprazole (PRILOSEC) 20 MG capsule Take 20 mg by mouth daily.        . verapamil (COVERA HS) 240 MG (CO) 24 hr tablet Take 240 mg by mouth 2 (two) times daily.       . [DISCONTINUED] hydrochlorothiazide (HYDRODIURIL) 25 MG tablet Take 1 tablet (25 mg total) by mouth daily.  90 tablet  4    Review of Systems  Constitutional: Negative.   HENT: Negative.   Eyes: Negative.   Respiratory: Negative.   Cardiovascular: Negative.   Gastrointestinal: Negative.   Musculoskeletal: Negative.   Skin: Negative.     Neurological: Negative.   Hematological: Negative.   Psychiatric/Behavioral: Negative.   All other systems reviewed and are negative.    BP 116/82  Pulse 69  Ht 5\' 10"  (1.778 m)  Wt 208 lb 4 oz (94.462 kg)  BMI 29.88 kg/m2  Physical Exam  Nursing note and vitals reviewed. Constitutional: He is oriented to person, place, and time. He appears well-developed and well-nourished.  HENT:  Head: Normocephalic.  Nose: Nose normal.  Mouth/Throat: Oropharynx is clear and moist.  Eyes: Conjunctivae normal are normal. Pupils are equal, round, and reactive to light.  Neck: Normal range of motion. Neck supple. No JVD present.  Cardiovascular: Normal rate, regular rhythm, S1 normal, S2 normal, normal heart sounds and intact distal pulses.  Exam reveals no gallop and no friction rub.   No murmur heard. Pulmonary/Chest: Effort normal and breath sounds normal. No respiratory distress. He has no wheezes. He has no rales. He exhibits no tenderness.  Abdominal: Soft. Bowel sounds are normal. He exhibits no distension. There is no tenderness.  Musculoskeletal: Normal range of motion. He exhibits no edema and no tenderness.  Lymphadenopathy:    He has no cervical adenopathy.  Neurological: He is alert and oriented to person, place, and time. Coordination normal.  Skin: Skin is warm and dry. No rash noted. No erythema.  Psychiatric:  He has a normal mood and affect. His behavior is normal. Judgment and thought content normal.           Assessment and Plan

## 2012-11-06 ENCOUNTER — Other Ambulatory Visit: Payer: Self-pay | Admitting: Cardiovascular Disease

## 2012-11-13 ENCOUNTER — Other Ambulatory Visit: Payer: Self-pay | Admitting: Cardiovascular Disease

## 2012-11-13 NOTE — Telephone Encounter (Signed)
Refilled Lasix sent to [facility] pharmacy.

## 2013-07-23 DIAGNOSIS — R972 Elevated prostate specific antigen [PSA]: Secondary | ICD-10-CM | POA: Insufficient documentation

## 2013-07-23 DIAGNOSIS — R39198 Other difficulties with micturition: Secondary | ICD-10-CM | POA: Insufficient documentation

## 2013-07-23 DIAGNOSIS — R351 Nocturia: Secondary | ICD-10-CM | POA: Insufficient documentation

## 2013-07-23 DIAGNOSIS — N138 Other obstructive and reflux uropathy: Secondary | ICD-10-CM | POA: Insufficient documentation

## 2013-07-23 DIAGNOSIS — R350 Frequency of micturition: Secondary | ICD-10-CM | POA: Insufficient documentation

## 2013-09-15 DIAGNOSIS — N4231 Prostatic intraepithelial neoplasia: Secondary | ICD-10-CM | POA: Insufficient documentation

## 2013-09-25 HISTORY — PX: BACK SURGERY: SHX140

## 2013-09-26 ENCOUNTER — Emergency Department (HOSPITAL_COMMUNITY)
Admission: EM | Admit: 2013-09-26 | Discharge: 2013-09-26 | Disposition: A | Discharge status: Home / Self Care | Payer: BC Managed Care – PPO | Attending: Emergency Medicine | Admitting: Emergency Medicine

## 2013-09-26 ENCOUNTER — Encounter (HOSPITAL_COMMUNITY): Payer: Self-pay | Admitting: Emergency Medicine

## 2013-09-26 DIAGNOSIS — Z87891 Personal history of nicotine dependence: Secondary | ICD-10-CM | POA: Insufficient documentation

## 2013-09-26 DIAGNOSIS — I10 Essential (primary) hypertension: Secondary | ICD-10-CM | POA: Insufficient documentation

## 2013-09-26 DIAGNOSIS — Z791 Long term (current) use of non-steroidal anti-inflammatories (NSAID): Secondary | ICD-10-CM | POA: Insufficient documentation

## 2013-09-26 DIAGNOSIS — M5430 Sciatica, unspecified side: Secondary | ICD-10-CM | POA: Insufficient documentation

## 2013-09-26 DIAGNOSIS — M5432 Sciatica, left side: Secondary | ICD-10-CM

## 2013-09-26 DIAGNOSIS — Z79899 Other long term (current) drug therapy: Secondary | ICD-10-CM | POA: Insufficient documentation

## 2013-09-26 DIAGNOSIS — M159 Polyosteoarthritis, unspecified: Secondary | ICD-10-CM | POA: Insufficient documentation

## 2013-09-26 HISTORY — DX: Dorsalgia, unspecified: M54.9

## 2013-09-26 LAB — CBC WITH DIFFERENTIAL/PLATELET
BASOS PCT: 0 % (ref 0–1)
Basophils Absolute: 0 10*3/uL (ref 0.0–0.1)
EOS ABS: 0 10*3/uL (ref 0.0–0.7)
Eosinophils Relative: 1 % (ref 0–5)
HCT: 39.7 % (ref 39.0–52.0)
Hemoglobin: 14.2 g/dL (ref 13.0–17.0)
Lymphocytes Relative: 11 % — ABNORMAL LOW (ref 12–46)
Lymphs Abs: 0.6 10*3/uL — ABNORMAL LOW (ref 0.7–4.0)
MCH: 29.5 pg (ref 26.0–34.0)
MCHC: 35.8 g/dL (ref 30.0–36.0)
MCV: 82.5 fL (ref 78.0–100.0)
MONO ABS: 0.4 10*3/uL (ref 0.1–1.0)
Monocytes Relative: 7 % (ref 3–12)
NEUTROS ABS: 4.9 10*3/uL (ref 1.7–7.7)
Neutrophils Relative %: 82 % — ABNORMAL HIGH (ref 43–77)
Platelets: 174 10*3/uL (ref 150–400)
RBC: 4.81 MIL/uL (ref 4.22–5.81)
RDW: 12.3 % (ref 11.5–15.5)
WBC: 5.9 10*3/uL (ref 4.0–10.5)

## 2013-09-26 LAB — URINALYSIS, ROUTINE W REFLEX MICROSCOPIC
Bilirubin Urine: NEGATIVE
Glucose, UA: NEGATIVE mg/dL
Hgb urine dipstick: NEGATIVE
Ketones, ur: NEGATIVE mg/dL
LEUKOCYTES UA: NEGATIVE
NITRITE: NEGATIVE
Protein, ur: NEGATIVE mg/dL
Specific Gravity, Urine: 1.022 (ref 1.005–1.030)
Urobilinogen, UA: 0.2 mg/dL (ref 0.0–1.0)
pH: 7.5 (ref 5.0–8.0)

## 2013-09-26 LAB — POCT I-STAT, CHEM 8
BUN: 13 mg/dL (ref 6–23)
CALCIUM ION: 1.11 mmol/L — AB (ref 1.13–1.30)
CHLORIDE: 101 meq/L (ref 96–112)
Creatinine, Ser: 1 mg/dL (ref 0.50–1.35)
Glucose, Bld: 113 mg/dL — ABNORMAL HIGH (ref 70–99)
HEMATOCRIT: 43 % (ref 39.0–52.0)
HEMOGLOBIN: 14.6 g/dL (ref 13.0–17.0)
Potassium: 3.6 mEq/L — ABNORMAL LOW (ref 3.7–5.3)
Sodium: 139 mEq/L (ref 137–147)
TCO2: 24 mmol/L (ref 0–100)

## 2013-09-26 MED ORDER — PREDNISONE 20 MG PO TABS
60.0000 mg | ORAL_TABLET | Freq: Once | ORAL | Status: AC
  Administered 2013-09-26: 60 mg via ORAL
  Filled 2013-09-26: qty 3

## 2013-09-26 MED ORDER — HYDROMORPHONE HCL PF 1 MG/ML IJ SOLN
1.0000 mg | Freq: Once | INTRAMUSCULAR | Status: AC
Start: 2013-09-26 — End: 2013-09-26
  Administered 2013-09-26: 1 mg via INTRAVENOUS
  Filled 2013-09-26: qty 1

## 2013-09-26 MED ORDER — METHYLPREDNISOLONE SODIUM SUCC 125 MG IJ SOLR: 125.0000 mg | Freq: Once | INTRAMUSCULAR | Status: DC

## 2013-09-26 MED ORDER — DIAZEPAM 5 MG/ML IJ SOLN
5.0000 mg | Freq: Once | INTRAMUSCULAR | Status: AC
  Administered 2013-09-26: 5 mg via INTRAVENOUS
  Filled 2013-09-26: qty 2

## 2013-09-26 MED ORDER — HYDROMORPHONE HCL PF 1 MG/ML IJ SOLN
1.0000 mg | Freq: Once | INTRAMUSCULAR | Status: AC
  Administered 2013-09-26: 1 mg via INTRAVENOUS
  Filled 2013-09-26: qty 1

## 2013-09-26 MED ORDER — ONDANSETRON HCL 4 MG/2ML IJ SOLN
4.0000 mg | Freq: Once | INTRAMUSCULAR | Status: AC
Start: 2013-09-26 — End: 2013-09-26
  Administered 2013-09-26: 4 mg via INTRAVENOUS
  Filled 2013-09-26: qty 2

## 2013-09-26 NOTE — ED Notes (Signed)
Vital signs stable. 

## 2013-09-26 NOTE — ED Provider Notes (Signed)
CSN: [id number]     Arrival date & time 09/26/13  0827 History   First MD Initiated Contact with Patient 09/26/13 [phone]     Chief Complaint  Patient presents with  . Back Pain   (Consider location/radiation/quality/duration/timing/severity/associated sxs/prior Treatment) HPI  63 year old male with history of bulging disc presents complaining of low back pain. Patient states for the past 2 weeks he has had intermittent low back pain. Pain initially started when he bend down to pick up an object.  Describe pain as a sharp sensation radiates down to his left leg as far as the ankle. Pain has been waxing waning and he has been seen and managed by his chiropractor. He has had pain like this in the past but never this severe. Last night the pain was progressively worse, keeping him up at night. Pain is now 10 out of 10, unrelieved with Fentanyl 229mcg that was given by EMS prior to arrival.  Patient denies any recent trauma. Denies fever, chills, chest pain, shortness of breath, productive cough, abdominal pain, numbness, weakness, urinary or bowel incontinence, or saddle paresthesia. He recently undergone a prostate biopsy and states that it was normal. He has no other complaints.  Past Medical History  Diagnosis Date  . Hypertension   . Osteoarthrosis involving multiple sites   . Back pain    Past Surgical History  Procedure Laterality Date  . 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 Replacement total knee      right x 2  . Appendectomy    . Basal cell carcinoma excision  2012    right neck    No family history on file. History  Substance Use Topics  . Smoking status: Former Smoker -- 1.00 packs/day    Types: Cigarettes    Quit date: 06/06/1981  . Smokeless tobacco: Not on file  . Alcohol Use: 0.5 oz/week    1 drink(s) per week    Review of Systems  All other systems reviewed and are negative.    Allergies  Lisinopril  Home Medications   Current Outpatient Rx  Name   Route  Sig  Dispense  Refill  . furosemide (LASIX) 20 MG tablet      TAKE ONE TABLET BY MOUTH TWICE DAILY AS NEEDED   60 tablet   6   . furosemide (LASIX) 20 MG tablet      TAKE ONE TABLET BY MOUTH TWICE DAILY AS NEEDED   60 tablet   3   . losartan (COZAAR) 100 MG tablet   Oral   Take 100 mg by mouth daily.           . naproxen sodium (ANAPROX) 220 MG tablet   Oral   Take 220 mg by mouth 2 (two) times daily with a meal.           . omeprazole (PRILOSEC) 20 MG capsule   Oral   Take 20 mg by mouth daily.           . verapamil (COVERA HS) 240 MG (CO) 24 hr tablet   Oral   Take 240 mg by mouth 2 (two) times daily.           BP 149/99  Pulse 78  Temp(Src) 98 F (36.7 C) (Oral)  Resp 20  Ht 5\' 9"  (1.753 m)  Wt 200 lb (90.719 kg)  BMI 29.52 kg/m2  SpO2 97% Physical Exam  Constitutional: He appears well-developed and well-nourished. No distress.  HENT:  Head: Atraumatic.  Eyes: Conjunctivae are normal.  Neck: Normal range of motion. Neck supple.  Cardiovascular: Normal rate and regular rhythm.   Pulmonary/Chest: Effort normal and breath sounds normal.  Abdominal: Soft. There is no tenderness.  Musculoskeletal: He exhibits tenderness (Moderate left paralumbar spinal tenderness to palpation without overlying skin changes. Positive straight leg raise.).  Neurological: He is alert.  Patellar deep tendon reflex intact bilaterally without foot drops, normal Babinski, intact distal pulses. Bilateral lower extremities are without palpable cords, erythema, edema, negative Homans sign    Skin: No rash noted.  Psychiatric: He has a normal mood and affect.    ED Course  Procedures (including critical care time)  10:57 AM Patient here with radicular low back pain suggestive of sciatica. He has no other red flags. He appears to be very uncomfortable. Pain is reducible on exam. I have low suspicion for kidney stones. I do not suspect caudal equina.  Will continue with  pain management.    12:45 PM Pain is improving with pain medication. Patient appears more comfortable. However the family members forward that his pain will likely worsen over the weekend. Family member did try to call neurosurgeon specialist today and was given the option of f/u on Monday.  However, they request to have neurosurgeon to see and consult pt while in ER.  Pt does not have any red flags sxs, but will consult neurosurgeon at the request of pt and family member.  Care discussed with attending.    12:55 PM i have consulted with neurosurgeon Dr. Ronnald Ramp who agrees to see pt in his office at 2:30pm today for further care.  He recommend hold off on prescribing pain meds.  Pt agrees to have close f/u.  Return precaution discussed.    Labs Review Labs Reviewed  CBC WITH DIFFERENTIAL - Abnormal; Notable for the following:    Neutrophils Relative % 82 (*)    Lymphocytes Relative 11 (*)    Lymphs Abs 0.6 (*)    All other components within normal limits  POCT I-STAT, CHEM 8 - Abnormal; Notable for the following:    Potassium 3.6 (*)    Glucose, Bld 113 (*)    Calcium, Ion 1.11 (*)    All other components within normal limits  URINALYSIS, ROUTINE W REFLEX MICROSCOPIC   Imaging Review No results found.  EKG Interpretation   None       MDM   1. Sciatica, left    BP 142/92  Pulse 83  Temp(Src) 98 F (36.7 C) (Oral)  Resp 20  Ht 5\' 9"  (1.753 m)  Wt 200 lb (90.719 kg)  BMI 29.52 kg/m2  SpO2 95%      Domenic Moras, PA-C 09/26/13 1256

## 2013-09-26 NOTE — ED Notes (Signed)
Presents via [facility] with c/o lower back pain, chronic issue but got worse this am.  EMS found patient on the floor, patient states he did not fall but laid down to ease pain.  Patient states he has ruptured disc but is unaware of exactly which vertebrae.  IV started by EMS, given 250 mcg of fentanyl by EMS

## 2013-09-26 NOTE — Discharge Instructions (Signed)
Please follow up with Dr. Ronnald Ramp at 2:30pm today in office for further care.    Sciatica Sciatica is pain, weakness, numbness, or tingling along the path of the sciatic nerve. The nerve starts in the lower back and runs down the back of each leg. The nerve controls the muscles in the lower leg and in the back of the knee, while also providing sensation to the back of the thigh, lower leg, and the sole of your foot. Sciatica is a symptom of another medical condition. For instance, nerve damage or certain conditions, such as a herniated disk or bone spur on the spine, pinch or put pressure on the sciatic nerve. This causes the pain, weakness, or other sensations normally associated with sciatica. Generally, sciatica only affects one side of the body. CAUSES   Herniated or slipped disc.  Degenerative disk disease.  A pain disorder involving the narrow muscle in the buttocks (piriformis syndrome).  Pelvic injury or fracture.  Pregnancy.  Tumor (rare). SYMPTOMS  Symptoms can vary from mild to very severe. The symptoms usually travel from the low back to the buttocks and down the back of the leg. Symptoms can include:  Mild tingling or dull aches in the lower back, leg, or hip.  Numbness in the back of the calf or sole of the foot.  Burning sensations in the lower back, leg, or hip.  Sharp pains in the lower back, leg, or hip.  Leg weakness.  Severe back pain inhibiting movement. These symptoms may get worse with coughing, sneezing, laughing, or prolonged sitting or standing. Also, being overweight may worsen symptoms. DIAGNOSIS  Your caregiver will perform a physical exam to look for common symptoms of sciatica. He or she may ask you to do certain movements or activities that would trigger sciatic nerve pain. Other tests may be performed to find the cause of the sciatica. These may include:  Blood tests.  X-rays.  Imaging tests, such as an MRI or CT scan. TREATMENT  Treatment is  directed at the cause of the sciatic pain. Sometimes, treatment is not necessary and the pain and discomfort goes away on its own. If treatment is needed, your caregiver may suggest:  Over-the-counter medicines to relieve pain.  Prescription medicines, such as anti-inflammatory medicine, muscle relaxants, or narcotics.  Applying heat or ice to the painful area.  Steroid injections to lessen pain, irritation, and inflammation around the nerve.  Reducing activity during periods of pain.  Exercising and stretching to strengthen your abdomen and improve flexibility of your spine. Your caregiver may suggest losing weight if the extra weight makes the back pain worse.  Physical therapy.  Surgery to eliminate what is pressing or pinching the nerve, such as a bone spur or part of a herniated disk. HOME CARE INSTRUCTIONS   Only take over-the-counter or prescription medicines for pain or discomfort as directed by your caregiver.  Apply ice to the affected area for 20 minutes, 3 4 times a day for the first 48 72 hours. Then try heat in the same way.  Exercise, stretch, or perform your usual activities if these do not aggravate your pain.  Attend physical therapy sessions as directed by your caregiver.  Keep all follow-up appointments as directed by your caregiver.  Do not wear high heels or shoes that do not provide proper support.  Check your mattress to see if it is too soft. A firm mattress may lessen your pain and discomfort. SEEK IMMEDIATE MEDICAL CARE IF:   You  lose control of your bowel or bladder (incontinence).  You have increasing weakness in the lower back, pelvis, buttocks, or legs.  You have redness or swelling of your back.  You have a burning sensation when you urinate.  You have pain that gets worse when you lie down or awakens you at night.  Your pain is worse than you have experienced in the past.  Your pain is lasting longer than 4 weeks.  You are suddenly  losing weight without reason. MAKE SURE YOU:  Understand these instructions.  Will watch your condition.  Will get help right away if you are not doing well or get worse. Document Released: 09/05/2001 Document Revised: 03/12/2012 Document Reviewed: 01/21/2012 [facility] Patient Information 2014 [place].

## 2013-09-26 NOTE — ED Notes (Signed)
PA at bedside.

## 2013-09-29 NOTE — ED Provider Notes (Signed)
Medical screening examination/treatment/procedure(s) were performed by non-physician practitioner and as supervising physician I was immediately available for consultation/collaboration.  EKG Interpretation   None         Mervin Kung, MD 09/29/13 [phone]

## 2013-10-06 ENCOUNTER — Encounter: Payer: Self-pay | Admitting: Cardiovascular Disease

## 2013-10-06 ENCOUNTER — Ambulatory Visit (INDEPENDENT_AMBULATORY_CARE_PROVIDER_SITE_OTHER): Payer: BC Managed Care – PPO | Admitting: Cardiovascular Disease

## 2013-10-06 VITALS — BP 108/86 | HR 69 | Ht 68.0 in | Wt 201.0 lb

## 2013-10-06 DIAGNOSIS — I10 Essential (primary) hypertension: Secondary | ICD-10-CM

## 2013-10-06 DIAGNOSIS — Z01810 Encounter for preprocedural cardiovascular examination: Secondary | ICD-10-CM

## 2013-10-06 DIAGNOSIS — R0602 Shortness of breath: Secondary | ICD-10-CM

## 2013-10-06 NOTE — Assessment & Plan Note (Signed)
Minimal symptoms. No known coronary artery disease by history. No further workup needed at this time

## 2013-10-06 NOTE — Assessment & Plan Note (Signed)
Blood pressure is well controlled on today's visit. No changes made to the medications. 

## 2013-10-06 NOTE — Assessment & Plan Note (Signed)
He would be acceptable risk for upcoming back surgery. No further testing needed. He does not have significant prior cardiac history concerning for angina.

## 2013-10-06 NOTE — Patient Instructions (Signed)
You are doing well. No medication changes were made.  Please call us if you have new issues that need to be addressed before your next appt.  Your physician wants you to follow-up in: 12 months.  You will receive a reminder letter in the mail two months in advance. If you don't receive a letter, please call our office to schedule the follow-up appointment. 

## 2013-10-06 NOTE — Progress Notes (Signed)
   Patient ID: Jerry Welch, male    DOB: 05/17/1951, 63 y.o.   MRN: [id number]  HPI Comments: Jerry Welch  is a pleasant 63 year old gentleman, patient of Dr. Kary Kos, with history of mild obesity, hypertension, migraines and osteoarthritis who presents for follow up of his hypertension,  and shortness of breath.   In followup today, he reports that he is doing well apart from his severe back pain. He is scheduled for disc surgery at the L4, L5 level. He reports back trouble started 30 years ago with a bulge disc. Symptoms became worse over the past 2 weeks, with excruciating pain. His aspirin has been held for pending surgery with Dr. Ronnald Ramp. He is not exercising at this time secondary to his severe back pain   EKG shows normal sinus rhythm with rate 69 beats per minute with no significant ST or T wave changes  ultrasound September 2013 showing hepatic steatohepatitis,     Recent lab work by Dr. Kary Kos August 2013 shows total cholesterol 154, LDL 87, HDL 40    Outpatient Encounter Prescriptions as of 10/06/2013  Medication Sig  . aspirin 81 MG tablet Take 81 mg by mouth daily.  Marland Kitchen losartan-hydrochlorothiazide (HYZAAR) 100-25 MG per tablet Take 1 tablet by mouth daily.  . naproxen sodium (ANAPROX) 220 MG tablet Take 220 mg by mouth 2 (two) times daily with a meal.   . omeprazole (PRILOSEC) 20 MG capsule Take 20 mg by mouth daily.    . verapamil (COVERA HS) 240 MG (CO) 24 hr tablet Take 240 mg by mouth 2 (two) times daily.     Review of Systems  Constitutional: Negative.   HENT: Negative.   Eyes: Negative.   Respiratory: Negative.   Cardiovascular: Negative.   Gastrointestinal: Negative.   Musculoskeletal: Positive for back pain.  Skin: Negative.   Neurological: Negative.   Psychiatric/Behavioral: Negative.   All other systems reviewed and are negative.    BP 108/86  Pulse 69  Ht 5\' 8"  (1.727 m)  Wt 201 lb (91.173 kg)  BMI 30.57 kg/m2  Physical Exam  Nursing note and  vitals reviewed. Constitutional: He is oriented to person, place, and time. He appears well-developed and well-nourished.  HENT:  Head: Normocephalic.  Nose: Nose normal.  Mouth/Throat: Oropharynx is clear and moist.  Eyes: Conjunctivae are normal. Pupils are equal, round, and reactive to light.  Neck: Normal range of motion. Neck supple. No JVD present.  Cardiovascular: Normal rate, regular rhythm, S1 normal, S2 normal, normal heart sounds and intact distal pulses.  Exam reveals no gallop and no friction rub.   No murmur heard. Pulmonary/Chest: Effort normal and breath sounds normal. No respiratory distress. He has no wheezes. He has no rales. He exhibits no tenderness.  Abdominal: Soft. Bowel sounds are normal. He exhibits no distension. There is no tenderness.  Musculoskeletal: Normal range of motion. He exhibits no edema and no tenderness.  Lymphadenopathy:    He has no cervical adenopathy.  Neurological: He is alert and oriented to person, place, and time. Coordination normal.  Skin: Skin is warm and dry. No rash noted. No erythema.  Psychiatric: He has a normal mood and affect. His behavior is normal. Judgment and thought content normal.      Assessment and Plan

## 2014-05-20 ENCOUNTER — Ambulatory Visit (INDEPENDENT_AMBULATORY_CARE_PROVIDER_SITE_OTHER): Payer: BC Managed Care – PPO

## 2014-05-20 ENCOUNTER — Ambulatory Visit (INDEPENDENT_AMBULATORY_CARE_PROVIDER_SITE_OTHER): Payer: BC Managed Care – PPO | Admitting: Podiatry

## 2014-05-20 ENCOUNTER — Encounter: Payer: Self-pay | Admitting: Podiatry

## 2014-05-20 VITALS — BP 130/75 | HR 61 | Resp 16 | Ht 70.0 in | Wt 200.0 lb

## 2014-05-20 DIAGNOSIS — M779 Enthesopathy, unspecified: Secondary | ICD-10-CM

## 2014-05-20 DIAGNOSIS — M778 Other enthesopathies, not elsewhere classified: Secondary | ICD-10-CM

## 2014-05-20 DIAGNOSIS — M7750 Other enthesopathy of unspecified foot: Secondary | ICD-10-CM

## 2014-05-20 MED ORDER — METHYLPREDNISOLONE (PAK) 4 MG PO TABS: ORAL_TABLET | ORAL | Status: DC

## 2014-05-20 NOTE — Progress Notes (Signed)
   Subjective:    Patient ID: Jerry Welch, male    DOB: 02/05/1951, 63 y.o.   MRN: [id number]  HPI Comments: i have pain in my left foot on the lateral side and on top. i have had this for 2 months. Its gotten better. Sunday was real bad with pain and swelling. It hurts to walk and stand. i have done nothing for my foot.  Foot Pain      Review of Systems  Hematological:       Swollen lymph nodes  All other systems reviewed and are negative.      Objective:   Physical Exam: I have reviewed his past medical history medications allergies surgeries social history and review of systems. Pulses are strongly palpable bilateral. Neurologic sensorium is intact per Semmes-Weinstein monofilament. Deep tendon reflexes are intact bilateral muscle strength is 5 over 5 dorsiflexors plantar flexors inverters everters all intrinsic musculature is intact. Orthopedic evaluation demonstrates pain on palpation to the fourth fifth met cuboid articulation area of the left foot. There is mild overlying edema pitting in nature with mild erythema just in this area. It is mildly warm to touch. Radiographic evaluation does not demonstrate any type of osseous abnormalities in this area other than some mild osteoarthritis of the aforementioned joints. Cutaneous evaluation does demonstrate the edema and mild erythema to this area. He states that this was severely painful and she could not even touch.        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  Plan: Started him a Medrol Dosepak and injected the area today with Kenalog and local anesthetic. We also sent him for blood work particularly an arthritic panel.

## 2014-05-21 LAB — SEDIMENTATION RATE: Sed Rate: 2 mm/hr (ref 0–30)

## 2014-05-22 LAB — C-REACTIVE PROTEIN: CRP: 0.6 mg/L (ref 0.0–4.9)

## 2014-05-22 LAB — ANA: Anti Nuclear Antibody(ANA): NEGATIVE

## 2014-05-22 LAB — URIC ACID: Uric Acid: 5.1 mg/dL (ref 3.7–8.6)

## 2014-05-22 LAB — RHEUMATOID FACTOR: Rhuematoid fact SerPl-aCnc: <7 IU/mL (ref 0.0–13.9)

## 2014-05-26 ENCOUNTER — Telehealth: Payer: Self-pay | Admitting: *Deleted

## 2014-05-26 NOTE — Telephone Encounter (Signed)
Message copied by Dierdre Searles on Tue May 26, 2014 11:29 AM ------      Message from: Jerry Welch      Created: Fri May 22, 2014  1:18 PM       Inform him that his blood work looked good. Gout is still possibility and make sure I have a copy of this with his snap shot on his next visit. ------

## 2014-05-26 NOTE — Telephone Encounter (Signed)
Spoke to patient , informed patient of blood work results

## 2014-06-03 ENCOUNTER — Ambulatory Visit: Payer: BC Managed Care – PPO | Admitting: Podiatry

## 2014-09-28 ENCOUNTER — Encounter: Payer: Self-pay | Admitting: Cardiovascular Disease

## 2014-12-02 ENCOUNTER — Encounter: Payer: Self-pay | Admitting: Cardiovascular Disease

## 2014-12-02 ENCOUNTER — Ambulatory Visit (INDEPENDENT_AMBULATORY_CARE_PROVIDER_SITE_OTHER): Payer: BLUE CROSS/BLUE SHIELD | Admitting: Cardiovascular Disease

## 2014-12-02 VITALS — BP 124/84 | HR 66 | Ht 68.0 in | Wt 210.5 lb

## 2014-12-02 DIAGNOSIS — I10 Essential (primary) hypertension: Secondary | ICD-10-CM

## 2014-12-02 DIAGNOSIS — R7309 Other abnormal glucose: Secondary | ICD-10-CM | POA: Insufficient documentation

## 2014-12-02 DIAGNOSIS — M7989 Other specified soft tissue disorders: Secondary | ICD-10-CM

## 2014-12-02 DIAGNOSIS — E669 Obesity, unspecified: Secondary | ICD-10-CM

## 2014-12-02 DIAGNOSIS — R748 Abnormal levels of other serum enzymes: Secondary | ICD-10-CM

## 2014-12-02 DIAGNOSIS — R7989 Other specified abnormal findings of blood chemistry: Secondary | ICD-10-CM | POA: Insufficient documentation

## 2014-12-02 NOTE — Assessment & Plan Note (Signed)
Weight up 10 pounds from last year. Recommended aggressive weight loss

## 2014-12-02 NOTE — Patient Instructions (Signed)
You are doing well. No medication changes were made.  Watch the carbos  Please call us if you have new issues that need to be addressed before your next appt.  Your physician wants you to follow-up in: 12 months.  You will receive a reminder letter in the mail two months in advance. If you don't receive a letter, please call our office to schedule the follow-up appointment.

## 2014-12-02 NOTE — Assessment & Plan Note (Signed)
Elevated creatinine 1.5 on last year's blood work. Possibly from overdiuresis as he is on HCTZ. We have recommended that he drink more prior to his lab work and if creatinine continues to run high, would likely change his medication to losartan without HCTZ

## 2014-12-02 NOTE — Assessment & Plan Note (Addendum)
Elevated glucose level on labs in 2015 to primary care. Recommended aggressive weight loss. Also needs follow-up with primary care for routine blood work. We did offer to do blood work through our office if he would like. Needs to be fasting

## 2014-12-02 NOTE — Assessment & Plan Note (Signed)
Blood pressure is well controlled on today's visit. No changes made to the medications. 

## 2014-12-02 NOTE — Assessment & Plan Note (Signed)
He has chronic lower extremity swelling for which she takes HCTZ/losartan. His leg swelling is likely venous insufficiency, exacerbated by verapamil. If symptoms get worse, would decrease the dose of his calcium channel blocker, use an alternate medication for blood pressure such as clonidine, hydralazine or Cardura

## 2014-12-02 NOTE — Progress Notes (Signed)
Patient ID: Jerry Welch, male    DOB: 1951/05/29, 64 y.o.   MRN: [id number]  HPI Comments: Mr. Jerry Welch  is a pleasant 64 year old gentleman, patient of Dr. Kary Welch, with history of mild obesity, hypertension, migraines and osteoarthritis who presents for follow up of his hypertension,  and shortness of breath.  Reports that his weight is up proximately 9 or 10 pounds from last year. Also over the course of the past year, prostate/PSA was elevated at he had biopsies. Review of previous lab work her primary care shows elevated creatinine 1.5, elevated glucose levels He does report occasional lower extremity edema No shortness of breath or chest pain with exertion  EKG on today's visit shows normal sinus rhythm with rate 66 bpm, no significant ST or T-wave changes  Other past medical history History of severe back pain.  trouble started 30 years ago with a bulge disc.   ultrasound September 2013 showing hepatic steatohepatitis,     Previous lab work by Dr. Kary Welch August 2013 shows total cholesterol 154, LDL 87, HDL 40    Allergies  Allergen Reactions  . Amlodipine Other (See Comments)    LE EDEMA  . Lisinopril     Cough-onset     Outpatient Encounter Prescriptions as of 12/02/2014  Medication Sig  . losartan-hydrochlorothiazide (HYZAAR) 100-25 MG per tablet Take 1 tablet by mouth daily.  . naproxen sodium (ANAPROX) 220 MG tablet Take 220 mg by mouth 2 (two) times daily with a meal.   . omeprazole (PRILOSEC) 20 MG capsule Take 20 mg by mouth daily.    . verapamil (COVERA HS) 240 MG (CO) 24 hr tablet Take 240 mg by mouth 2 (two) times daily.   . [DISCONTINUED] methylPREDNIsolone (MEDROL DOSPACK) 4 MG tablet follow package directions (Patient not taking: Reported on 12/02/2014)    Past Medical History  Diagnosis Date  . Hypertension   . Osteoarthrosis involving multiple sites   . Back pain   . Ruptured lumbar disc     L4 & L5    Past Surgical History  Procedure  Laterality Date  . 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 Replacement total knee      right x 2  . Appendectomy    . Basal cell carcinoma excision  2012    right neck   . Prostate biopsy    . Back surgery  09/2013    L4-L5     Social History  reports that he quit smoking about 33 years ago. His smoking use included Cigarettes. He smoked 1.00 pack per day. He does not have any smokeless tobacco history on file. He reports that he drinks about 0.5 oz of alcohol per week. He reports that he does not use illicit drugs.  Family History Family history is unknown by patient.   Review of Systems  Constitutional: Negative.   Respiratory: Negative.   Cardiovascular: Negative.   Gastrointestinal: Negative.   Musculoskeletal: Positive for back pain.  Skin: Negative.   Neurological: Negative.   Hematological: Negative.   Psychiatric/Behavioral: Negative.   All other systems reviewed and are negative.   BP 124/84 mmHg  Pulse 66  Ht 5\' 8"  (1.727 m)  Wt 210 lb 8 oz (95.482 kg)  BMI 32.01 kg/m2  Physical Exam  Constitutional: He is oriented to person, place, and time. He appears well-developed and well-nourished.  HENT:  Head: Normocephalic.  Nose: Nose normal.  Mouth/Throat: Oropharynx is clear and moist.  Eyes:  Conjunctivae are normal. Pupils are equal, round, and reactive to light.  Neck: Normal range of motion. Neck supple. No JVD present.  Cardiovascular: Normal rate, regular rhythm, S1 normal, S2 normal, normal heart sounds and intact distal pulses.  Exam reveals no gallop and no friction rub.   No murmur heard. Pulmonary/Chest: Effort normal and breath sounds normal. No respiratory distress. He has no wheezes. He has no rales. He exhibits no tenderness.  Abdominal: Soft. Bowel sounds are normal. He exhibits no distension. There is no tenderness.  Musculoskeletal: Normal range of motion. He exhibits no edema or tenderness.  Lymphadenopathy:    He has no cervical  adenopathy.  Neurological: He is alert and oriented to person, place, and time. Coordination normal.  Skin: Skin is warm and dry. No rash noted. No erythema.  Psychiatric: He has a normal mood and affect. His behavior is normal. Judgment and thought content normal.      Assessment and Plan   Nursing note and vitals reviewed.

## 2015-04-30 ENCOUNTER — Encounter: Payer: Self-pay | Admitting: Urology

## 2015-04-30 ENCOUNTER — Ambulatory Visit (INDEPENDENT_AMBULATORY_CARE_PROVIDER_SITE_OTHER): Payer: 59 | Admitting: Urology

## 2015-04-30 VITALS — BP 147/73 | HR 83 | Ht 70.0 in | Wt 212.0 lb

## 2015-04-30 DIAGNOSIS — R972 Elevated prostate specific antigen [PSA]: Secondary | ICD-10-CM

## 2015-04-30 DIAGNOSIS — N528 Other male erectile dysfunction: Secondary | ICD-10-CM | POA: Diagnosis not present

## 2015-04-30 DIAGNOSIS — M1990 Unspecified osteoarthritis, unspecified site: Secondary | ICD-10-CM | POA: Insufficient documentation

## 2015-04-30 DIAGNOSIS — R350 Frequency of micturition: Secondary | ICD-10-CM

## 2015-04-30 DIAGNOSIS — I10 Essential (primary) hypertension: Secondary | ICD-10-CM | POA: Insufficient documentation

## 2015-04-30 DIAGNOSIS — E785 Hyperlipidemia, unspecified: Secondary | ICD-10-CM

## 2015-04-30 HISTORY — DX: Hyperlipidemia, unspecified: E78.5

## 2015-04-30 LAB — MICROSCOPIC EXAMINATION
Bacteria, UA: NONE SEEN
Epithelial Cells (non renal): NONE SEEN /hpf (ref 0–10)
RBC MICROSCOPIC, UA: NONE SEEN /HPF (ref 0–?)
RENAL EPITHEL UA: NONE SEEN /HPF

## 2015-04-30 LAB — URINALYSIS, COMPLETE
Bilirubin, UA: NEGATIVE
Glucose, UA: NEGATIVE
KETONES UA: NEGATIVE
Leukocytes, UA: NEGATIVE
Nitrite, UA: NEGATIVE
PROTEIN UA: NEGATIVE
RBC, UA: NEGATIVE
SPEC GRAV UA: 1.015 (ref 1.005–1.030)
UUROB: 0.2 mg/dL (ref 0.2–1.0)
pH, UA: 6 (ref 5.0–7.5)

## 2015-04-30 LAB — BLADDER SCAN AMB NON-IMAGING

## 2015-04-30 NOTE — Progress Notes (Signed)
04/30/2015 10:33 AM   Jerry Welch 1950/12/17 [id number]  Referring provider: [name], MD [address] [place], [place] [address]  Chief Complaint  Patient presents with  . Elevated PSA    44month    HPI: 64 year-old male with a history of elevated PSA and is status post prostate biopsy on 09/08/2013 at which time his PSA was 4.8.revealing several foci of high-grade and with a focus at the right base. His TRUS volume at the time was 57 cc.  His PSA subsequently rose to 7.07 on 05/2014 and he went repeat prostate biopsy on 09/2014 showed chronic inflammation but no evidence of malignancy, repeat TRUS 24 cc.  He returns today for 6 month f/u PSA/ DRE.  He denies a family history of prostate cancer.   He does have severe baseline voiding symptoms, primarily urinary frequency and urgency q1 hour He denies episodes of urge incontinence. He also complains of nocturia 2-3. He denies any significant obstructive voiding symptoms including incomplete bladder emptying difficulty starting his stream,or straining to void. He denies any urinary tract infections or gross hematuria. He reports that he has tried Flomax in the past without any benefit. He is not currently taking any medication.  He does drink a good amount water and other beverages throughout the day including occasional soda/  Coffee.  He does have OSA (s/p sleep study) s/p uvulectomy but is now started snoring again.    He does also have baseline COPD, creatinine 1.5 on 05/2014.  He does also report baseline erectile dysfunction but is not particularly bothered by this at this time.    PMH: Past Medical History  Diagnosis Date  . Hypertension   . Osteoarthrosis involving multiple sites   . Back pain   . Ruptured lumbar disc     L4 & L5  . HLD (hyperlipidemia) 04/30/2015    Surgical History: Past Surgical History  Procedure Laterality Date  . 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 Replacement  total knee      right x 2  . Appendectomy    . Basal cell carcinoma excision  2012    right neck   . Prostate biopsy    . Back surgery  09/2013    L4-L5     Home Medications:    Medication List       This list is accurate as of: 04/30/15 11:59 PM.  Always use your most recent med list.               losartan-hydrochlorothiazide 100-25 MG per tablet  Commonly known as:  HYZAAR  Take 1 tablet by mouth daily.     naproxen sodium 220 MG tablet  Commonly known as:  ANAPROX  Take 220 mg by mouth 2 (two) times daily with a meal.     omeprazole 20 MG capsule  Commonly known as:  PRILOSEC  Take 20 mg by mouth daily.     verapamil 240 MG (CO) 24 hr tablet  Commonly known as:  COVERA HS  Take 240 mg by mouth 2 (two) times daily.        Allergies:  Allergies  Allergen Reactions  . Amlodipine Other (See Comments)    LE EDEMA  . Lisinopril     Cough-onset     Family History: Family History  Problem Relation Age of Onset  . Bladder Cancer Neg Hx   . Prostate cancer Neg Hx   . Kidney  cancer Neg Hx   . Heart failure Mother   . Heart attack Maternal Grandfather     Social History:  reports that he quit smoking about 33 years ago. His smoking use included Cigarettes. He smoked 1.00 pack per day. He does not have any smokeless tobacco history on file. He reports that he drinks about 0.5 oz of alcohol per week. He reports that he does not use illicit drugs.  ROS: UROLOGY Frequent Urination?: Yes Hard to postpone urination?: No Burning/pain with urination?: No Get up at night to urinate?: No Leakage of urine?: No Urine stream starts and stops?: No Trouble starting stream?: No Do you have to strain to urinate?: No Blood in urine?: No Urinary tract infection?: No Sexually transmitted disease?: No Injury to kidneys or bladder?: No Painful intercourse?: No Weak stream?: No Erection problems?: No Penile pain?: No  Gastrointestinal Nausea?: No Vomiting?:  No Indigestion/heartburn?: No Diarrhea?: No Constipation?: No  Constitutional Fever: No Night sweats?: No Weight loss?: No Fatigue?: No  Skin Skin rash/lesions?: No Itching?: No  Eyes Blurred vision?: No Double vision?: No  Ears/Nose/Throat Sore throat?: No Sinus problems?: No  Hematologic/Lymphatic Swollen glands?: No Easy bruising?: No  Cardiovascular Leg swelling?: No Chest pain?: No  Respiratory Cough?: No Shortness of breath?: No  Endocrine Excessive thirst?: No  Musculoskeletal Back pain?: No Joint pain?: No  Neurological Headaches?: No Dizziness?: No  Psychologic Depression?: No Anxiety?: No  Physical Exam: BP 147/73 mmHg  Pulse 83  Ht 5\' 10"  (1.778 m)  Wt 212 lb (96.163 kg)  BMI 30.42 kg/m2  Constitutional:  Alert and oriented, No acute distress. HEENT: [place] AT, moist mucus membranes.  Trachea midline, no masses. Cardiovascular: No clubbing, cyanosis, or edema. Respiratory: Normal respiratory effort, no increased work of breathing. GI: Abdomen is soft, nontender, nondistended, no abdominal masses GU: No CVA tenderness.  Rectal exam today with normal sphincter tone, 30+ cc prostate, no nodules, nontender. Skin: No rashes, bruises or suspicious lesions. Neurologic: Grossly intact, no focal deficits, moving all 4 extremities. Psychiatric: Normal mood and affect.  Laboratory Data: Lab Results  Component Value Date   WBC 5.9 09/26/2013   HGB 14.6 09/26/2013   HCT 43.0 09/26/2013   MCV 82.5 09/26/2013   PLT 174 09/26/2013    Lab Results  Component Value Date   CREATININE 1.00 09/26/2013    Lab Results  Component Value Date   PSA 6.9* 04/30/2015    Urinalysis Results for orders placed or performed in visit on 04/30/15  Microscopic Examination  Result Value Ref Range   WBC, UA 0-5 0 -  5 /hpf   RBC, UA None seen 0 -  2 /hpf   Epithelial Cells (non renal) None seen 0 - 10 /hpf   Renal Epithel, UA None seen None seen /hpf    Bacteria, UA None seen None seen/Few  PSA  Result Value Ref Range   Prostate Specific Ag, Serum 6.9 (H) 0.0 - 4.0 ng/mL  Urinalysis, Complete  Result Value Ref Range   Specific Gravity, UA 1.015 1.005 - 1.030   pH, UA 6.0 5.0 - 7.5   Color, UA Yellow Yellow   Appearance Ur Clear Clear   Leukocytes, UA Negative Negative   Protein, UA Negative Negative/Trace   Glucose, UA Negative Negative   Ketones, UA Negative Negative   RBC, UA Negative Negative   Bilirubin, UA Negative Negative   Urobilinogen, Ur 0.2 0.2 - 1.0 mg/dL   Nitrite, UA Negative Negative   Microscopic  Examination See below:   BLADDER SCAN AMB NON-IMAGING  Result Value Ref Range   Scan Result 71ml     Pertinent Imaging: n/a  Assessment & Plan:  64 year old male with history of elevated PSA status post negative biopsy 2. Rectal exam today benign, PSA remained stably elevated. We'll continue to follow closely. Primary complaints today including daytime frequency and nocturia. Postvoid residual today minimal therefore no concern for overflow.    1. Elevated PSA Repeat PSA/ DRE in 6 months - PSA - Urinalysis, Complete  2. Urinary frequency Behavioral modification today at great length.  I do suspect that part of his urinary symptoms may be related to poorly controlled or incompletely treated sleep apnea. I have advised the patient to discuss this further with his PCP as he may need another sleep study and possibly CPAP. We discussed the pathophysiology how sleep apnea is related to urinary symptoms.  Patient is interested in medical management of his frequency. Samples of Vesicare 5 mg daily x 2 weeks given.  Discussed, side effects of anticholinergic medications including dry eyes, dry mouth, and constipation. He will let us know if he sees any effect with this medication or if perhaps we need to switch to another agent. - BLADDER SCAN AMB NON-IMAGING  3. Other male erectile dysfunction No intervention desired at  this time, we will continue to follow.   Return in about 6 months (around 10/31/2015) for PSA, DRE, IPSS, PVR.  Hollice Espy, MD  [facility] [address], [place],  [address] [phone]

## 2015-05-01 LAB — PSA: Prostate Specific Ag, Serum: 6.9 ng/mL — ABNORMAL HIGH (ref 0.0–4.0)

## 2015-05-02 ENCOUNTER — Encounter: Payer: Self-pay | Admitting: Urology

## 2015-05-02 MED ORDER — SOLIFENACIN SUCCINATE 10 MG PO TABS: 5.0000 mg | ORAL_TABLET | Freq: Every day | ORAL | Status: DC

## 2015-05-07 ENCOUNTER — Encounter: Payer: Self-pay | Admitting: Urology

## 2015-11-05 ENCOUNTER — Ambulatory Visit: Payer: 59 | Admitting: Urology

## 2015-11-18 ENCOUNTER — Encounter: Payer: Self-pay | Admitting: Urology

## 2015-11-18 ENCOUNTER — Ambulatory Visit (INDEPENDENT_AMBULATORY_CARE_PROVIDER_SITE_OTHER): Payer: 59 | Admitting: Urology

## 2015-11-18 VITALS — BP 137/86 | HR 69 | Ht 68.0 in | Wt 215.8 lb

## 2015-11-18 DIAGNOSIS — N528 Other male erectile dysfunction: Secondary | ICD-10-CM | POA: Diagnosis not present

## 2015-11-18 DIAGNOSIS — R972 Elevated prostate specific antigen [PSA]: Secondary | ICD-10-CM

## 2015-11-18 DIAGNOSIS — R350 Frequency of micturition: Secondary | ICD-10-CM | POA: Diagnosis not present

## 2015-11-18 LAB — BLADDER SCAN AMB NON-IMAGING

## 2015-11-18 NOTE — Progress Notes (Signed)
4:37 PM   11/18/2015  KELLY MARGULIS 06-11-1951 [id number]  Referring provider: [name], MD [address] [facility] [place], [place] [address]  Chief Complaint  Patient presents with  . Elevated PSA    83month  . Urinary Frequency    HPI: 65 year-old male with multiple GU issuse  History of elevated PSA No family history of prostate cancer Status post prostate biopsy on 09/08/2013 at which time his PSA was 4.8. revealing several foci of high-grade PIN and with a focus at the right base. His TRUS volume at the time was 57 cc.   His PSA subsequently rose to 7.07 on 05/2014 and he went repeat prostate biopsy on 09/2014 showed chronic inflammation but no evidence of malignancy, repeat TRUS 24 cc. Most recent PSA 6.9 on 10/2015, DRE benign Returns today for PSA/ DRE.  BPH with LUTS Continues to have frequency and nocturia x 3.  He denies episodes of urge incontinence.  He reports that he has tried Flomax in the past without any benefit He does drink a good amount water and other beverages throughout the day including occasional soda/  Coffee. He does also have baseline CKD, creatinine 1.5 on 05/2014. Last visit recommended repeat sleep study- pursued and now on CPAP x 4 months which has helped fatigue and snoring but not big difference in urinary symptoms Given samples of Vesicare 10 mg last visit and did not notice any effect Overall does not want to keep trying medication  ED He does also report baseline erectile dysfunction but is not particularly bothered by this at this time.  PVR 31 cc      IPSS      11/18/15 1000       International Prostate Symptom Score   How often have you had the sensation of not emptying your bladder? Less than half the time     How often have you had to urinate less than every two hours? More than half the time     How often have you found you stopped and started again several times when you urinated? Less than 1 in 5  times     How often have you found it difficult to postpone urination? About half the time     How often have you had a weak urinary stream? Less than half the time     How often have you had to strain to start urination? Less than 1 in 5 times     How many times did you typically get up at night to urinate? 3 Times     Total IPSS Score 16     Quality of Life due to urinary symptoms   If you were to spend the rest of your life with your urinary condition just the way it is now how would you feel about that? Mixed        Score:  1-7 Mild 8-19 Moderate 20-35 Severe     PMH: Past Medical History  Diagnosis Date  . Hypertension   . Osteoarthrosis involving multiple sites   . Back pain   . Ruptured lumbar disc     L4 & L5  . HLD (hyperlipidemia) 04/30/2015    Surgical History: Past Surgical History  Procedure Laterality Date  . 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 Replacement total knee      right x 2  . Appendectomy    . Basal cell carcinoma  excision  2012    right neck   . Prostate biopsy    . Back surgery  09/2013    L4-L5     Home Medications:    Medication List       This list is accurate as of: 11/18/15  4:37 PM.  Always use your most recent med list.               losartan-hydrochlorothiazide 100-25 MG tablet  Commonly known as:  HYZAAR  Take 1 tablet by mouth daily.     naproxen sodium 220 MG tablet  Commonly known as:  ANAPROX  Take 220 mg by mouth 2 (two) times daily with a meal.     omeprazole 20 MG capsule  Commonly known as:  PRILOSEC  Take 20 mg by mouth daily.     solifenacin 10 MG tablet  Commonly known as:  VESICARE  Take 0.5 tablets (5 mg total) by mouth daily.     verapamil 240 MG (CO) 24 hr tablet  Commonly known as:  COVERA HS  Take 240 mg by mouth 2 (two) times daily.        Allergies:  Allergies  Allergen Reactions  . Amlodipine Other (See Comments)    LE EDEMA  . Lisinopril     Cough-onset     Family  History: Family History  Problem Relation Age of Onset  . Bladder Cancer Neg Hx   . Prostate cancer Neg Hx   . Kidney cancer Neg Hx   . Heart failure Mother   . Heart attack Maternal Grandfather     Social History:  reports that he quit smoking about 34 years ago. His smoking use included Cigarettes. He smoked 1.00 pack per day. He does not have any smokeless tobacco history on file. He reports that he drinks about 0.5 oz of alcohol per week. He reports that he does not use illicit drugs.  ROS: UROLOGY Frequent Urination?: No Hard to postpone urination?: No Burning/pain with urination?: No Get up at night to urinate?: No Leakage of urine?: No Urine stream starts and stops?: No Trouble starting stream?: No Do you have to strain to urinate?: No Blood in urine?: No Urinary tract infection?: No Sexually transmitted disease?: No Injury to kidneys or bladder?: No Painful intercourse?: No Weak stream?: No Erection problems?: No Penile pain?: No  Gastrointestinal Nausea?: No Vomiting?: No Indigestion/heartburn?: No Diarrhea?: No Constipation?: No  Constitutional Fever: No Night sweats?: No Weight loss?: No Fatigue?: No  Skin Skin rash/lesions?: No Itching?: No  Eyes Blurred vision?: No Double vision?: No  Ears/Nose/Throat Sore throat?: No Sinus problems?: No  Hematologic/Lymphatic Swollen glands?: No Easy bruising?: No  Cardiovascular Leg swelling?: No Chest pain?: No  Respiratory Cough?: No  Endocrine Excessive thirst?: No  Musculoskeletal Back pain?: No Joint pain?: No  Neurological Headaches?: No Dizziness?: No  Psychologic Depression?: No Anxiety?: No  Physical Exam: BP 137/86 mmHg  Pulse 69  Ht 5\' 8"  (1.727 m)  Wt 215 lb 12.8 oz (97.886 kg)  BMI 32.82 kg/m2  Constitutional:  Alert and oriented, No acute distress. HEENT: [place] AT, moist mucus membranes.  Trachea midline, no masses. Cardiovascular: No clubbing, cyanosis, or  edema. Respiratory: Normal respiratory effort, no increased work of breathing. GI: Abdomen is soft, nontender, nondistended, no abdominal masses GU: No CVA tenderness.  Rectal exam today with normal sphincter tone, 30+ cc prostate, no nodules, nontender. Skin: No rashes, bruises or suspicious lesions. Neurologic: Grossly intact, no focal deficits, moving  all 4 extremities. Psychiatric: Normal mood and affect.  Laboratory Data: Cr 1.1  04/2015  Urinalysis Results for orders placed or performed in visit on 11/18/15  BLADDER SCAN AMB NON-IMAGING  Result Value Ref Range   Scan Result 77ml     Pertinent Imaging: n/a  Assessment & Plan:  65 year old male with history of elevated PSA status post negative biopsy 2. Rectal exam today benign, PSA remained stably elevated. We'll continue to follow closely.   1. Elevated PSA DRE today benign  PSA ordered, will continue PSA to follow on 6 month basis if remains stably elevated (will mychart message results) If PSA continues to rise, consider endorectal MRI  2. Urinary frequency Discussed various options today including trial of additional anti-cholinergic versus beta 3 agonist. He is not particularly interested in pursuing any further pharmacotherapy at this time with minimal bother.  3. Other male erectile dysfunction No intervention desired at this time, we will continue to follow.   Return in about 6 months (around 05/17/2016) for PSA/ DRE.  Hollice Espy, MD  [facility] [address], [place], [place] [address] [phone]

## 2015-11-19 LAB — URINALYSIS, COMPLETE
Bilirubin, UA: NEGATIVE
Glucose, UA: NEGATIVE
KETONES UA: NEGATIVE
LEUKOCYTES UA: NEGATIVE
NITRITE UA: NEGATIVE
Protein, UA: NEGATIVE
RBC, UA: NEGATIVE
Specific Gravity, UA: 1.025 (ref 1.005–1.030)
Urobilinogen, Ur: 0.2 mg/dL (ref 0.2–1.0)
pH, UA: 6 (ref 5.0–7.5)

## 2015-11-19 LAB — MICROSCOPIC EXAMINATION: Bacteria, UA: NONE SEEN

## 2015-11-19 LAB — PSA: Prostate Specific Ag, Serum: 8 ng/mL — ABNORMAL HIGH (ref 0.0–4.0)

## 2015-11-25 ENCOUNTER — Telehealth: Payer: Self-pay | Admitting: Urology

## 2015-11-25 DIAGNOSIS — R972 Elevated prostate specific antigen [PSA]: Secondary | ICD-10-CM

## 2015-11-25 NOTE — Telephone Encounter (Signed)
i will take care of getting this approved and make sure they schd it at [place]

## 2015-11-25 NOTE — Telephone Encounter (Signed)
Patient called this morning regarding his rising PSA results (6.9 result 6 months ago) and 8.0 now.  He is interested to know what your recommendation for next steps.  He does not want to wait until his appointment in August.  Please advise.

## 2015-11-25 NOTE — Telephone Encounter (Signed)
Discussed patient's most recent PSA result now 8.0 with the patient. It was previously as high as 9 with evidence of chronic inflammation on biopsy. Discussed that although this is elevated, it still within the same range of elevated PSA. He is quite anxious and would prefer to pursue further workup which I feel that is reasonable given his absolute PSA number and his PSA density.  Discussed endorectal MRI today at length. He is willing to pursue this. Plan to arrange for the study and have him follow-up to discuss results.    Hollice Espy, MD

## 2015-11-30 ENCOUNTER — Telehealth: Payer: Self-pay | Admitting: Urology

## 2015-11-30 NOTE — Telephone Encounter (Signed)
Follow up in the office please.  Hollice Espy, MD

## 2015-11-30 NOTE — Telephone Encounter (Signed)
I got patient's MRI approved and he is calling to get it schd today. Do you want him to come in for the results or are you going to call him?   Thanks,  Sharyn Lull

## 2015-12-03 ENCOUNTER — Ambulatory Visit (INDEPENDENT_AMBULATORY_CARE_PROVIDER_SITE_OTHER): Payer: 59 | Admitting: Cardiovascular Disease

## 2015-12-03 ENCOUNTER — Encounter: Payer: Self-pay | Admitting: Cardiovascular Disease

## 2015-12-03 VITALS — BP 122/80 | HR 58 | Ht 68.0 in | Wt 215.1 lb

## 2015-12-03 DIAGNOSIS — R39198 Other difficulties with micturition: Secondary | ICD-10-CM | POA: Diagnosis not present

## 2015-12-03 DIAGNOSIS — I10 Essential (primary) hypertension: Secondary | ICD-10-CM | POA: Diagnosis not present

## 2015-12-03 DIAGNOSIS — M7989 Other specified soft tissue disorders: Secondary | ICD-10-CM

## 2015-12-03 DIAGNOSIS — R600 Localized edema: Secondary | ICD-10-CM | POA: Diagnosis not present

## 2015-12-03 MED ORDER — DOXAZOSIN MESYLATE 8 MG PO TABS: 8.0000 mg | ORAL_TABLET | Freq: Every day | ORAL | Status: DC

## 2015-12-03 NOTE — Assessment & Plan Note (Signed)
I suspect leg swelling is exacerbated by his calcium channel blocker He had similar symptoms on amlodipine We'll try to wean the verapamil

## 2015-12-03 NOTE — Assessment & Plan Note (Signed)
Recommended he start Cardura/doxazosin for blood pressure This may also help his urination and prostate issues

## 2015-12-03 NOTE — Patient Instructions (Addendum)
You are doing well.  Please stop one of the verapamil Monitor your blood pressure If it runs high (>145 on the top), then start 1/2 of the doxazosin Ok to take a full pill if needed for blood pressure  Call the office with your blood pressure numbers  Please call us if you have new issues that need to be addressed before your next appt.  Your physician wants you to follow-up in: 6 months.  You will receive a reminder letter in the mail two months in advance. If you don't receive a letter, please call our office to schedule the follow-up appointment.

## 2015-12-03 NOTE — Assessment & Plan Note (Signed)
Verapamil likely contributing to his lower extremity swelling Recommended we slowly wean off the verapamil with close monitoring of his blood pressure Suggested he go down to one of the verapamil 240 mg pills Also suggested if blood pressure runs high, he start Cardura 4 mg with slow titration up to 8 mg daily for blood pressure support

## 2015-12-03 NOTE — Progress Notes (Signed)
Patient ID: Jerry Welch, male    DOB: Feb 27, 1951, 65 y.o.   MRN: [id number]  HPI Comments: Jerry Welch  is a pleasant 65 year old gentleman, patient of Dr. Kary Kos, with history of mild obesity, hypertension, migraines and osteoarthritis who presents for follow up of his hypertension,  and shortness of breath.  In follow-up today, he reports that he is bothered by lower extremity swelling Worse on the left leg than the right, this is been a chronic issue On his last clinic visit in 2016, he reported occasional lower extremity edema  He denies any chest pain or shortness of breath on exertion Lab work reviewed, previous creatinine 1.5 now is normal, down to 1 Total cholesterol reviewed, 147, LDL 77, hemoglobin A1c 5.7  PSA is elevated, Reports he has MRI scheduled of his prostate  EKG on today's visit shows normal sinus rhythm with rate 58 bpm, no significant ST or T-wave changes  Other past medical history History of severe back pain.  trouble started 30 years ago with a bulge disc.   ultrasound September 2013 showing hepatic steatohepatitis,     Previous lab work by Dr. Kary Kos August 2013 shows total cholesterol 154, LDL 87, HDL 40    Allergies  Allergen Reactions  . Amlodipine Other (See Comments)    LE EDEMA  . Lisinopril     Cough-onset     Outpatient Encounter Prescriptions as of 12/03/2015  Medication Sig  . losartan-hydrochlorothiazide (HYZAAR) 100-25 MG per tablet Take 1 tablet by mouth daily.  . naproxen sodium (ANAPROX) 220 MG tablet Take 220 mg by mouth 2 (two) times daily with a meal.   . omeprazole (PRILOSEC) 20 MG capsule Take 20 mg by mouth daily.    . verapamil (COVERA HS) 240 MG (CO) 24 hr tablet Take 240 mg by mouth 2 (two) times daily.   Marland Kitchen doxazosin (CARDURA) 8 MG tablet Take 1 tablet (8 mg total) by mouth daily.  . [DISCONTINUED] solifenacin (VESICARE) 10 MG tablet Take 0.5 tablets (5 mg total) by mouth daily.   No facility-administered  encounter medications on file as of 12/03/2015.    Past Medical History  Diagnosis Date  . Hypertension   . Osteoarthrosis involving multiple sites   . Back pain   . Ruptured lumbar disc     L4 & L5  . HLD (hyperlipidemia) 04/30/2015    Past Surgical History  Procedure Laterality Date  . 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 Replacement total knee      right x 2  . Appendectomy    . Basal cell carcinoma excision  2012    right neck   . Prostate biopsy    . Back surgery  09/2013    L4-L5     Social History  reports that he quit smoking about 34 years ago. His smoking use included Cigarettes. He smoked 1.00 pack per day. He does not have any smokeless tobacco history on file. He reports that he drinks about 0.5 oz of alcohol per week. He reports that he does not use illicit drugs.  Family History family history includes Heart attack in his maternal grandfather; Heart failure in his mother. There is no history of Bladder Cancer, Prostate cancer, or Kidney cancer.   Review of Systems  Constitutional: Negative.   Respiratory: Negative.   Cardiovascular: Negative.   Gastrointestinal: Negative.   Musculoskeletal: Positive for back pain.  Skin: Negative.   Neurological: Negative.   Hematological:  Negative.   Psychiatric/Behavioral: Negative.   All other systems reviewed and are negative.   BP 122/80 mmHg  Pulse 58  Ht 5\' 8"  (1.727 m)  Wt 215 lb 1.9 oz (97.578 kg)  BMI 32.72 kg/m2  Physical Exam  Constitutional: He is oriented to person, place, and time. He appears well-developed and well-nourished.  HENT:  Head: Normocephalic.  Nose: Nose normal.  Mouth/Throat: Oropharynx is clear and moist.  Eyes: Conjunctivae are normal. Pupils are equal, round, and reactive to light.  Neck: Normal range of motion. Neck supple. No JVD present.  Cardiovascular: Normal rate, regular rhythm, S1 normal, S2 normal, normal heart sounds and intact distal pulses.  Exam reveals no  gallop and no friction rub.   No murmur heard. Nonpitting edema of the lower extremities to below the knees, worse on the left than the right  Pulmonary/Chest: Effort normal and breath sounds normal. No respiratory distress. He has no wheezes. He has no rales. He exhibits no tenderness.  Abdominal: Soft. Bowel sounds are normal. He exhibits no distension. There is no tenderness.  Musculoskeletal: Normal range of motion. He exhibits no edema or tenderness.  Lymphadenopathy:    He has no cervical adenopathy.  Neurological: He is alert and oriented to person, place, and time. Coordination normal.  Skin: Skin is warm and dry. No rash noted. No erythema.  Psychiatric: He has a normal mood and affect. His behavior is normal. Judgment and thought content normal.      Assessment and Plan   Nursing note and vitals reviewed.

## 2015-12-07 ENCOUNTER — Ambulatory Visit (HOSPITAL_COMMUNITY)
Admission: RE | Admit: 2015-12-07 | Discharge: 2015-12-07 | Disposition: A | Discharge status: Home / Self Care | Payer: 59 | Source: Ambulatory Visit | Attending: Urology | Admitting: Urology

## 2015-12-07 DIAGNOSIS — R972 Elevated prostate specific antigen [PSA]: Secondary | ICD-10-CM | POA: Insufficient documentation

## 2015-12-07 DIAGNOSIS — R935 Abnormal findings on diagnostic imaging of other abdominal regions, including retroperitoneum: Secondary | ICD-10-CM | POA: Diagnosis not present

## 2015-12-07 LAB — POCT I-STAT CREATININE: CREATININE: 1.1 mg/dL (ref 0.61–1.24)

## 2015-12-07 MED ORDER — GADOBENATE DIMEGLUMINE 529 MG/ML IV SOLN
20.0000 mL | Freq: Once | INTRAVENOUS | Status: AC | PRN
  Administered 2015-12-07: 19 mL via INTRAVENOUS

## 2015-12-10 ENCOUNTER — Encounter: Payer: Self-pay | Admitting: Urology

## 2015-12-10 ENCOUNTER — Ambulatory Visit (INDEPENDENT_AMBULATORY_CARE_PROVIDER_SITE_OTHER): Payer: 59 | Admitting: Urology

## 2015-12-10 VITALS — BP 146/86 | HR 84 | Ht 68.0 in | Wt 219.0 lb

## 2015-12-10 DIAGNOSIS — R972 Elevated prostate specific antigen [PSA]: Secondary | ICD-10-CM

## 2015-12-10 DIAGNOSIS — R350 Frequency of micturition: Secondary | ICD-10-CM | POA: Diagnosis not present

## 2015-12-10 DIAGNOSIS — N528 Other male erectile dysfunction: Secondary | ICD-10-CM

## 2015-12-10 NOTE — Progress Notes (Signed)
2:50 PM   12/10/2015  ABRAHAN PAGETT 03-02-1951 [id number]  Referring provider: [name], MD [address] [facility] [place], [place] [address]  Chief Complaint  Patient presents with  . Results    HPI: 65 year-old male with multiple GU issuse  History of elevated PSA No family history of prostate cancer Status post prostate biopsy on 09/08/2013 at which time his PSA was 4.8. revealing several foci of high-grade PIN and with a focus at the right base. His TRUS volume at the time was 57 cc.   His PSA subsequently rose to 7.07 on 05/2014 and he went repeat prostate biopsy on 09/2014 showed chronic inflammation but no evidence of malignancy, repeat TRUS 24 cc. Most recent PSA 8.0 on 10/2015, DRE benign which was up from 6.9 on 04/2015.  Returns today for endorectal MRI results.  This showed areas vagueT2 hypodensities at the right medial apex which could represent prostatitis previous infection versus neoplasm but no obvious area.  Images from the scan reviewed with the patient and also discussed with Dr. Joellyn Quails from radiology.   BPH with LUTS Continues to have frequency and nocturia x 3.  He denies episodes of urge incontinence.  He reports that he has tried Flomax in the past without any benefit He does drink a good amount water and other beverages throughout the day including occasional soda/  Coffee. He does also have baseline CKD, creatinine 1.5 on 05/2014. Last visit recommended repeat sleep study- pursued and now on CPAP x 4 months which has helped fatigue and snoring but not big difference in urinary symptoms No benefit from Vesicare 10 mg samples Overall does not want to keep trying medication  ED He does also report baseline erectile dysfunction but is not particularly bothered by this at this time.  PVR 31 cc      IPSS      11/18/15 1000       International Prostate Symptom Score   How often have you had the sensation of not emptying your  bladder? Less than half the time     How often have you had to urinate less than every two hours? More than half the time     How often have you found you stopped and started again several times when you urinated? Less than 1 in 5 times     How often have you found it difficult to postpone urination? About half the time     How often have you had a weak urinary stream? Less than half the time     How often have you had to strain to start urination? Less than 1 in 5 times     How many times did you typically get up at night to urinate? 3 Times     Total IPSS Score 16     Quality of Life due to urinary symptoms   If you were to spend the rest of your life with your urinary condition just the way it is now how would you feel about that? Mixed        Score:  1-7 Mild 8-19 Moderate 20-35 Severe     PMH: Past Medical History  Diagnosis Date  . Hypertension   . Osteoarthrosis involving multiple sites   . Back pain   . Ruptured lumbar disc     L4 & L5  . HLD (hyperlipidemia) 04/30/2015    Surgical History: Past Surgical History  Procedure Laterality Date  .  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 Replacement total knee      right x 2  . Appendectomy    . Basal cell carcinoma excision  2012    right neck   . Prostate biopsy    . Back surgery  09/2013    L4-L5     Home Medications:    Medication List       This list is accurate as of: 12/10/15  2:50 PM.  Always use your most recent med list.               doxazosin 8 MG tablet  Commonly known as:  CARDURA  Take 1 tablet (8 mg total) by mouth daily.     losartan-hydrochlorothiazide 100-25 MG tablet  Commonly known as:  HYZAAR  Take 1 tablet by mouth daily.     naproxen sodium 220 MG tablet  Commonly known as:  ANAPROX  Take 220 mg by mouth 2 (two) times daily with a meal.     omeprazole 20 MG capsule  Commonly known as:  PRILOSEC  Take 20 mg by mouth daily.     verapamil 240 MG (CO) 24 hr tablet    Commonly known as:  COVERA HS  Take 240 mg by mouth 2 (two) times daily.        Allergies:  Allergies  Allergen Reactions  . Amlodipine Other (See Comments)    LE EDEMA  . Lisinopril     Cough-onset     Family History: Family History  Problem Relation Age of Onset  . Bladder Cancer Neg Hx   . Prostate cancer Neg Hx   . Kidney cancer Neg Hx   . Heart failure Mother   . Heart attack Maternal Grandfather     Social History:  reports that he quit smoking about 34 years ago. His smoking use included Cigarettes. He smoked 1.00 pack per day. He does not have any smokeless tobacco history on file. He reports that he drinks about 0.5 oz of alcohol per week. He reports that he does not use illicit drugs.  ROS: UROLOGY Frequent Urination?: Yes Hard to postpone urination?: No Burning/pain with urination?: No Get up at night to urinate?: Yes Leakage of urine?: No Urine stream starts and stops?: No Trouble starting stream?: No Do you have to strain to urinate?: No Blood in urine?: No Urinary tract infection?: No Sexually transmitted disease?: No Injury to kidneys or bladder?: No Painful intercourse?: No Weak stream?: No Erection problems?: No Penile pain?: No  Gastrointestinal Nausea?: No Vomiting?: No Indigestion/heartburn?: No Diarrhea?: No Constipation?: No  Constitutional Fever: No Night sweats?: No Weight loss?: No Fatigue?: No  Skin Skin rash/lesions?: No Itching?: No  Eyes Blurred vision?: No Double vision?: No  Ears/Nose/Throat Sore throat?: No Sinus problems?: No  Hematologic/Lymphatic Swollen glands?: No Easy bruising?: No  Cardiovascular Leg swelling?: No Chest pain?: No  Respiratory Cough?: No Shortness of breath?: No  Endocrine Excessive thirst?: No  Musculoskeletal Back pain?: No Joint pain?: No  Neurological Headaches?: No Dizziness?: No  Psychologic Depression?: No Anxiety?: No  Physical Exam: BP 146/86 mmHg   Pulse 84  Ht 5\' 8"  (1.727 m)  Wt 219 lb (99.338 kg)  BMI 33.31 kg/m2  Constitutional:  Alert and oriented, No acute distress. Wife present today.  HEENT: [place] AT, moist mucus membranes.  Trachea midline, no masses. Cardiovascular: No clubbing, cyanosis, or edema. Respiratory: Normal respiratory effort, no increased work of breathing.  Skin: No rashes, bruises or suspicious lesions. Neurologic: Grossly intact, no focal deficits, moving all 4 extremities. Psychiatric: Normal mood and affect.  Laboratory Data: Cr 1.1  04/2015  Component     Latest Ref Rng 04/30/2015 11/18/2015  PSA     0.0 - 4.0 ng/mL 6.9 (H) 8.0 (H)    Urinalysis Results for orders placed or performed during the hospital encounter of 12/07/15  I-STAT creatinine  Result Value Ref Range   Creatinine, Ser 1.10 0.61 - 1.24 mg/dL    Pertinent Imaging: Study Result     CLINICAL DATA: Elevated PSA with prior negative biopsy.  EXAM: MR PROSTATE WITHOUT AND WITH CONTRAST  TECHNIQUE: Multiplanar multisequence MRI images were obtained of the pelvis centered about the prostate. Pre and post contrast images were obtained.  CONTRAST: 47mL MULTIHANCE GADOBENATE DIMEGLUMINE 529 MG/ML IV SOLN  COMPARISON: Biopsy results 10/23/2014.  FINDINGS: Prostate: Demonstrates relatively mild central gland enlargement and heterogeneity indicative of benign prostatic hyperplasia.  Medial right apex area of relatively may T2 hypo intensity measures maximally 8 mm on image 21/series 6, image 7/series 8. This corresponds to subtle restricted diffusion, including on image 15/series 1100 and mild post-contrast enhancement, including on image 54/series 12003.  A separate area of left anterior apical T2 hypo intensity measures on the order of 6 mm on image 21/series 6 and image 16/series 5. Corresponds to probable restricted diffusion on image 15/series 1100. Early post-contrast enhancement on image 56/series 12003. This is  adjacent to the central gland, which demonstrates multiple nodules of similar morphology.  Transcapsular spread: Absent  Seminal vesicle involvement: Absent  Neurovascular bundle involvement: Absent  Pelvic adenopathy: Absent  Bone metastasis: Absent  Other findings: Small bilateral hydroceles, likely physiologic. Normal urinary bladder and pelvic bowel loops.  IMPRESSION: 1. An area of vague T2 hypo intensity within the medial right apex, corresponding to mildly restricted diffusion and early post-contrast enhancement. Although this could represent an area of prior infection (given biopsy results and relatively vague T2 hypo intensity) neoplasia cannot be excluded and attention on subsequent imaging and/or biopsy recommended. 2. An anterior left apical lesion could represent an exophytic central gland benign prostatic hyperplasia nodule. A peripheral zone neoplastic nodule is felt less likely but cannot be excluded. This area could also be sampled on follow-up.   Electronically Signed  By: Abigail Miyamoto M.D.  On: 12/08/2015 09:13   MRI was personally reviewed along with Dr. Jobe Igo.    Assessment & Plan:  65 year old male with history of elevated PSA status post negative biopsy 2. Now s/p endorectal MRI without any significant obvious tumors but several subtle findings.  1. Elevated PSA  No obvious tumor on endorectal MRI but several borderline suspicious areas. Discussed alternatives including proceeding the saturation biopsy focusing on those areas, MRI fusion biopsy, and continued surveillance with closely following PSA/DRE. At this time, we will continue to follow him closely and consider MRI fusion biopsy in the future should his PSA continued to rise.  Patient and his wife are agreeable with this plan.  2. Urinary frequency Not interested in pharmacotherapy.  3. Other male erectile dysfunction No intervention desired at this time, we will continue to  follow.   Return in about 4 months (around 04/10/2016) for PSA/ DRE.  Hollice Espy, MD  [facility] [address], [place], [place] [address] [phone]

## 2015-12-29 ENCOUNTER — Encounter: Payer: Self-pay | Admitting: Podiatry

## 2015-12-29 ENCOUNTER — Ambulatory Visit (INDEPENDENT_AMBULATORY_CARE_PROVIDER_SITE_OTHER): Payer: 59 | Admitting: Podiatry

## 2015-12-29 ENCOUNTER — Ambulatory Visit (INDEPENDENT_AMBULATORY_CARE_PROVIDER_SITE_OTHER): Payer: 59

## 2015-12-29 VITALS — BP 146/75 | HR 69 | Resp 16

## 2015-12-29 DIAGNOSIS — M722 Plantar fascial fibromatosis: Secondary | ICD-10-CM

## 2015-12-29 MED ORDER — METHYLPREDNISOLONE 4 MG PO TBPK: ORAL_TABLET | ORAL | Status: DC

## 2015-12-29 MED ORDER — MELOXICAM 15 MG PO TABS: 15.0000 mg | ORAL_TABLET | Freq: Every day | ORAL | Status: DC

## 2015-12-29 NOTE — Progress Notes (Signed)
He presents today with chief complaint of pain to the plantar aspect of his right heel times approximately one month. He denies any trauma. States that mornings are the worst and anytime after he's been sitting for while gets back up to walk is very painful. He's tried nothing for this.  Objective: Vital signs are stable he is alert and oriented 3 pulses are palpable. No calf pain is noted. He has pain on direct palpation medial calcaneal tubercle of the right heel and radiographs demonstrate soft tissue increase in density at the plantar fascial calcaneal insertion site. This is indicative of plantar fasciitis.  Assessment: Plantar fasciitis right foot.  Plan: I injected the right heel today with Kenalog and local anesthetic dispensed a plantar fascial brace dispensed descriptions for Medrol Dosepak to be followed by meloxicam. Follow-up with him in 1 month we discussed the etiology pathology conservative versus surgical therapies. We also discussed appropriate shoe gear stretching exercises ice therapy and shoe gear modifications.

## 2015-12-29 NOTE — Patient Instructions (Signed)

## 2016-01-31 ENCOUNTER — Encounter: Payer: Self-pay | Admitting: Podiatry

## 2016-01-31 ENCOUNTER — Ambulatory Visit (INDEPENDENT_AMBULATORY_CARE_PROVIDER_SITE_OTHER): Payer: 59 | Admitting: Podiatry

## 2016-01-31 DIAGNOSIS — M722 Plantar fascial fibromatosis: Secondary | ICD-10-CM | POA: Diagnosis not present

## 2016-01-31 MED ORDER — DICLOFENAC SODIUM 75 MG PO TBEC: 75.0000 mg | DELAYED_RELEASE_TABLET | Freq: Two times a day (BID) | ORAL | Status: DC

## 2016-02-02 NOTE — Progress Notes (Signed)
He presents today one month status post being diagnosed with plantar fasciitis. He states that the medications the injections and the splint has not worked very well at all for him. He still has pain to the right heel.  Objective: Vital signs are stable alert and 3 pain on palpation medially continue tubercle of the right heel.  Assessment: Pain and limb secondary to plantar fasciitis right.  Plan: I injected his right heel today with Kenalog local anesthetic and dexamethasone. I discontinued his meloxicam started him on diclofenac. Once again discussed appropriate shoe your with him.

## 2016-02-24 ENCOUNTER — Other Ambulatory Visit: Payer: Self-pay | Admitting: Urology

## 2016-02-24 DIAGNOSIS — C61 Malignant neoplasm of prostate: Secondary | ICD-10-CM

## 2016-02-28 ENCOUNTER — Ambulatory Visit (INDEPENDENT_AMBULATORY_CARE_PROVIDER_SITE_OTHER): Payer: BLUE CROSS/BLUE SHIELD | Admitting: Podiatry

## 2016-02-28 ENCOUNTER — Encounter: Payer: Self-pay | Admitting: Podiatry

## 2016-02-28 DIAGNOSIS — M722 Plantar fascial fibromatosis: Secondary | ICD-10-CM | POA: Diagnosis not present

## 2016-02-29 NOTE — Progress Notes (Signed)
He presents today for follow-up of pain to his right heel. He states that is approximately 50% better than it was last visit. He continues to take his anti-inflammatories regularly.  Objective: Vital signs are stable he is alert and oriented 3. Pulses are palpable. He still retains some pain on palpation medial calcaneal tubercle of the right heel. No calf pain is noted.  Assessment: Plantar fasciitis approximately 50% resolved.  Plan: Discussed etiology pathology conservative versus surgical therapies. I injected his right heel once again today he will continue other conservative therapies follow up with me in 6 weeks.

## 2016-03-01 ENCOUNTER — Ambulatory Visit
Admission: RE | Admit: 2016-03-01 | Discharge: 2016-03-01 | Disposition: A | Discharge status: Home / Self Care | Payer: 59 | Source: Ambulatory Visit | Attending: Urology | Admitting: Urology

## 2016-03-01 DIAGNOSIS — C61 Malignant neoplasm of prostate: Secondary | ICD-10-CM

## 2016-03-02 ENCOUNTER — Telehealth: Payer: Self-pay

## 2016-03-02 NOTE — Telephone Encounter (Signed)
Patient notified/SW 

## 2016-03-02 NOTE — Telephone Encounter (Signed)
-----   Message from Hollice Espy, MD sent at 03/01/2016  7:42 PM EDT ----- This patient had a repeat prostate ultrasound for study purposes only. Please let him know that per the radiology report, there was really no change from her previous discussion. Just an FYI.

## 2016-04-10 ENCOUNTER — Ambulatory Visit (INDEPENDENT_AMBULATORY_CARE_PROVIDER_SITE_OTHER): Payer: BLUE CROSS/BLUE SHIELD | Admitting: Podiatry

## 2016-04-10 ENCOUNTER — Encounter: Payer: Self-pay | Admitting: Podiatry

## 2016-04-10 DIAGNOSIS — M779 Enthesopathy, unspecified: Secondary | ICD-10-CM

## 2016-04-10 DIAGNOSIS — M722 Plantar fascial fibromatosis: Secondary | ICD-10-CM | POA: Diagnosis not present

## 2016-04-10 DIAGNOSIS — M7751 Other enthesopathy of right foot: Secondary | ICD-10-CM

## 2016-04-10 DIAGNOSIS — M778 Other enthesopathies, not elsewhere classified: Secondary | ICD-10-CM

## 2016-04-10 NOTE — Progress Notes (Signed)
He presents today state that his heel is a whole lot better and feels almost normal. However he states that his toe as he points to the first metatarsophalangeal joint has been painful particularly in the mornings until he starts moving and gets it loosened up. He denies any trauma to the foot. He has some tenderness along the course of the posterior tibial tendon.  Objective: Pulses are palpable right foot. Tenderness on palpation first metatarsophalangeal joint and in range of motion of the first metatarsophalangeal joint. Very little reproducible pain on palpation of the medial calcaneal tubercle.  Assessment: Capsulitis first metatarsophalangeal joint right foot with residual plantar fasciitis.  Plan: I injected the first metatarsophalangeal joint today with dexamethasone and local anesthetic after a sterile Betadine skin prep. Follow up with him in 6 weeks if necessary. Discussed the possible need for an MRI.

## 2016-04-11 ENCOUNTER — Other Ambulatory Visit: Payer: 59

## 2016-04-11 ENCOUNTER — Other Ambulatory Visit: Payer: BLUE CROSS/BLUE SHIELD

## 2016-04-11 DIAGNOSIS — R972 Elevated prostate specific antigen [PSA]: Secondary | ICD-10-CM

## 2016-04-12 LAB — PSA: Prostate Specific Ag, Serum: 8.3 ng/mL — ABNORMAL HIGH (ref 0.0–4.0)

## 2016-04-14 ENCOUNTER — Encounter: Payer: Self-pay | Admitting: Urology

## 2016-04-14 ENCOUNTER — Ambulatory Visit (INDEPENDENT_AMBULATORY_CARE_PROVIDER_SITE_OTHER): Payer: BLUE CROSS/BLUE SHIELD | Admitting: Urology

## 2016-04-14 VITALS — BP 133/79 | HR 69 | Ht 68.0 in | Wt 210.3 lb

## 2016-04-14 DIAGNOSIS — R350 Frequency of micturition: Secondary | ICD-10-CM | POA: Diagnosis not present

## 2016-04-14 DIAGNOSIS — R972 Elevated prostate specific antigen [PSA]: Secondary | ICD-10-CM | POA: Diagnosis not present

## 2016-04-14 NOTE — Progress Notes (Signed)
3:16 PM   04/14/2016  Jerry Welch [birth year] [id number]  Referring provider: [name], MD [address] [facility] [place], [place] [address]  Chief Complaint  Patient presents with  . Follow-up    PSA 8.29    HPI: 65 year-old male with multiple GU issuse  History of elevated PSA No family history of prostate cancer Status post prostate biopsy on 09/08/2013 at which time his PSA was 4.8. revealing several foci of high-grade PIN and with a focus at the right base. His TRUS volume at the time was 57 cc.   His PSA subsequently rose to 7.07 on 05/2014 and he went repeat prostate biopsy on 09/2014 showed chronic inflammation but no evidence of malignancy, repeat TRUS 24 cc. Endorectal MRI 02/2016 with equivical, no evidence of high grade diease.    Most recent PSA 8.3 on 03/2016, DRE benign which was up from 6.9 on 04/2015 but relatively stable from 11/18/15.   BPH with LUTS Continues to have frequency and nocturia x 1-2 down from 3x.  He denies episodes of urge incontinence.  He reports that he has tried Flomax in the past without any benefit He does drink a good amount water and other beverages throughout the day including occasional soda/  Coffee. He does also have baseline CKD,  Cr 1.1  Now using CPAP with improved night time symptoms No benefit from Vesicare 10 mg samples Overall does not want to keep trying medication  ED He does also report baseline erectile dysfunction but is not particularly bothered by this at this time.      PMH: Past Medical History  Diagnosis Date  . Hypertension   . Osteoarthrosis involving multiple sites   . Back pain   . Ruptured lumbar disc     L4 & L5  . HLD (hyperlipidemia) 04/30/2015    Surgical History: Past Surgical History  Procedure Laterality Date  . 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 Replacement total knee      right x 2  . Appendectomy    . Basal cell carcinoma excision  2012    right  neck   . Prostate biopsy    . Back surgery  09/2013    L4-L5     Home Medications:    Medication List       This list is accurate as of: 04/14/16  3:16 PM.  Always use your most recent med list.               cyanocobalamin 1000 MCG/ML injection  Commonly known as:  (VITAMIN B-12)  Inject into the muscle.     diclofenac 75 MG EC tablet  Commonly known as:  VOLTAREN  Take 1 tablet (75 mg total) by mouth 2 (two) times daily.     losartan-hydrochlorothiazide 100-25 MG tablet  Commonly known as:  HYZAAR  Take 1 tablet by mouth daily.     naproxen sodium 220 MG tablet  Commonly known as:  ANAPROX  Take 220 mg by mouth 2 (two) times daily with a meal.     omeprazole 20 MG capsule  Commonly known as:  PRILOSEC  Take 20 mg by mouth daily.     potassium chloride 10 MEQ tablet  Commonly known as:  K-DUR     verapamil 240 MG (CO) 24 hr tablet  Commonly known as:  COVERA HS  Take 240 mg by mouth 2 (two) times daily.  Allergies:  Allergies  Allergen Reactions  . Amlodipine Other (See Comments)    LE EDEMA  . Lisinopril     Cough-onset     Family History: Family History  Problem Relation Age of Onset  . Bladder Cancer Neg Hx   . Prostate cancer Neg Hx   . Kidney cancer Neg Hx   . Heart failure Mother   . Heart attack Maternal Grandfather     Social History:  reports that he quit smoking about 34 years ago. His smoking use included Cigarettes. He smoked 1.00 pack per day. He does not have any smokeless tobacco history on file. He reports that he drinks about 0.5 oz of alcohol per week. He reports that he does not use illicit drugs.  ROS: UROLOGY Frequent Urination?: Yes Hard to postpone urination?: Yes Burning/pain with urination?: No Get up at night to urinate?: Yes Leakage of urine?: No Urine stream starts and stops?: Yes Trouble starting stream?: Yes Do you have to strain to urinate?: No Blood in urine?: No Urinary tract infection?: No Sexually  transmitted disease?: No Injury to kidneys or bladder?: No Painful intercourse?: No Weak stream?: No Erection problems?: No Penile pain?: No  Gastrointestinal Nausea?: No Vomiting?: No Indigestion/heartburn?: No Diarrhea?: No Constipation?: No  Constitutional Fever: No Night sweats?: No Weight loss?: No Fatigue?: No  Skin Skin rash/lesions?: No Itching?: No  Eyes Blurred vision?: No Double vision?: No  Ears/Nose/Throat Sore throat?: No Sinus problems?: No  Hematologic/Lymphatic Swollen glands?: No Easy bruising?: No  Cardiovascular Leg swelling?: Yes Chest pain?: No  Respiratory Cough?: No Shortness of breath?: No  Endocrine Excessive thirst?: No  Musculoskeletal Back pain?: No Joint pain?: No  Neurological Headaches?: No Dizziness?: No  Psychologic Depression?: No Anxiety?: No  Physical Exam: BP 133/79 mmHg  Pulse 69  Ht 5\' 8"  (1.727 m)  Wt 210 lb 4.8 oz (95.391 kg)  BMI 31.98 kg/m2  Constitutional:  Alert and oriented, No acute distress. Wife present today.  HEENT: [place] AT, moist mucus membranes.  Trachea midline, no masses. Cardiovascular: No clubbing, cyanosis, or edema. Respiratory: Normal respiratory effort, no increased work of breathing. Skin: No rashes, bruises or suspicious lesions. Rectal exam: normal sphincter tone, 40 cc prostate, no masses or nodules, nontender Neurologic: Grossly intact, no focal deficits, moving all 4 extremities. Psychiatric: Normal mood and affect.  Laboratory Data: Cr 1.1  On 11/2015  Component     Latest Ref Rng 04/30/2015 11/18/2015 04/11/2016  PSA     0.0 - 4.0 ng/mL 6.9 (H) 8.0 (H) 8.3 (H)    Urinalysis Results for orders placed or performed in visit on 04/11/16  PSA  Result Value Ref Range   Prostate Specific Ag, Serum 8.3 (H) 0.0 - 4.0 ng/mL     Assessment & Plan:  65 year old male with history of elevated PSA status post negative biopsy 2. And s/p endorectal MRI without any significant  obvious tumors but several subtle findings.   1. Elevated PSA PSA relatively stable and DRE unchanged continue to follow in 6 months with PSA/ DRE  2. Urinary frequency Not interested in pharmacotherapy.  3. Other male erectile dysfunction No intervention desired at this time, we will continue to follow.   Return in about 6 months (around 10/15/2016) for PSA/ DRE.  Hollice Espy, MD  [facility] [address] [place], [place] [address] [phone]

## 2016-05-15 ENCOUNTER — Ambulatory Visit: Payer: BLUE CROSS/BLUE SHIELD | Admitting: Podiatry

## 2016-05-19 ENCOUNTER — Ambulatory Visit: Payer: 59 | Admitting: Urology

## 2016-05-23 ENCOUNTER — Ambulatory Visit: Payer: 59 | Admitting: Urology

## 2016-09-20 ENCOUNTER — Emergency Department: Payer: BLUE CROSS/BLUE SHIELD

## 2016-09-20 ENCOUNTER — Emergency Department
Admission: EM | Admit: 2016-09-20 | Discharge: 2016-09-20 | Disposition: A | Discharge status: Home / Self Care | Payer: BLUE CROSS/BLUE SHIELD | Attending: Emergency Medicine | Admitting: Emergency Medicine

## 2016-09-20 ENCOUNTER — Encounter: Payer: Self-pay | Admitting: Emergency Medicine

## 2016-09-20 ENCOUNTER — Telehealth: Payer: Self-pay | Admitting: Cardiovascular Disease

## 2016-09-20 DIAGNOSIS — I10 Essential (primary) hypertension: Secondary | ICD-10-CM | POA: Insufficient documentation

## 2016-09-20 DIAGNOSIS — Z79899 Other long term (current) drug therapy: Secondary | ICD-10-CM | POA: Insufficient documentation

## 2016-09-20 DIAGNOSIS — R42 Dizziness and giddiness: Secondary | ICD-10-CM | POA: Diagnosis not present

## 2016-09-20 DIAGNOSIS — Z87891 Personal history of nicotine dependence: Secondary | ICD-10-CM | POA: Insufficient documentation

## 2016-09-20 DIAGNOSIS — Z791 Long term (current) use of non-steroidal anti-inflammatories (NSAID): Secondary | ICD-10-CM | POA: Insufficient documentation

## 2016-09-20 LAB — CBC
HEMATOCRIT: 48.3 % (ref 40.0–52.0)
HEMOGLOBIN: 16.7 g/dL (ref 13.0–18.0)
MCH: 29.6 pg (ref 26.0–34.0)
MCHC: 34.5 g/dL (ref 32.0–36.0)
MCV: 85.6 fL (ref 80.0–100.0)
Platelets: 192 10*3/uL (ref 150–440)
RBC: 5.64 MIL/uL (ref 4.40–5.90)
RDW: 13.5 % (ref 11.5–14.5)
WBC: 6.5 10*3/uL (ref 3.8–10.6)

## 2016-09-20 LAB — COMPREHENSIVE METABOLIC PANEL
ALBUMIN: 4.3 g/dL (ref 3.5–5.0)
ALK PHOS: 71 U/L (ref 38–126)
ALT: 23 U/L (ref 17–63)
ANION GAP: 10 (ref 5–15)
AST: 32 U/L (ref 15–41)
BILIRUBIN TOTAL: 1.2 mg/dL (ref 0.3–1.2)
BUN: 9 mg/dL (ref 6–20)
CALCIUM: 9.2 mg/dL (ref 8.9–10.3)
CO2: 25 mmol/L (ref 22–32)
Chloride: 103 mmol/L (ref 101–111)
Creatinine, Ser: 1.16 mg/dL (ref 0.61–1.24)
Glucose, Bld: 138 mg/dL — ABNORMAL HIGH (ref 65–99)
POTASSIUM: 3.9 mmol/L (ref 3.5–5.1)
Sodium: 138 mmol/L (ref 135–145)
TOTAL PROTEIN: 7.5 g/dL (ref 6.5–8.1)

## 2016-09-20 LAB — TROPONIN I

## 2016-09-20 MED ORDER — HYDRALAZINE HCL 20 MG/ML IJ SOLN
5.0000 mg | Freq: Once | INTRAMUSCULAR | Status: AC
  Administered 2016-09-20: 5 mg via INTRAVENOUS
  Filled 2016-09-20: qty 1

## 2016-09-20 MED ORDER — LORAZEPAM 2 MG/ML IJ SOLN
0.5000 mg | Freq: Once | INTRAMUSCULAR | Status: AC
  Administered 2016-09-20: 0.5 mg via INTRAVENOUS
  Filled 2016-09-20: qty 1

## 2016-09-20 MED ORDER — MECLIZINE HCL 25 MG PO TABS
25.0000 mg | ORAL_TABLET | Freq: Once | ORAL | Status: AC
  Administered 2016-09-20: 25 mg via ORAL
  Filled 2016-09-20: qty 1

## 2016-09-20 MED ORDER — MECLIZINE HCL 25 MG PO TABS: 25.0000 mg | ORAL_TABLET | Freq: Three times a day (TID) | ORAL | 0 refills | Status: DC | PRN

## 2016-09-20 MED ORDER — SODIUM CHLORIDE 0.9 % IV SOLN
1000.0000 mL | Freq: Once | INTRAVENOUS | Status: AC
  Administered 2016-09-20: 1000 mL via INTRAVENOUS

## 2016-09-20 MED ORDER — IBUPROFEN 400 MG PO TABS
400.0000 mg | ORAL_TABLET | Freq: Once | ORAL | Status: AC
  Administered 2016-09-20: 400 mg via ORAL
  Filled 2016-09-20: qty 1

## 2016-09-20 NOTE — ED Triage Notes (Signed)
Pt to ed with c/o dizziness that started yesterday afternoon,  Pt also reports headache with the dizziness.  Pt states he took his BP and it was high so last night he took double the dose of all blood pressure meds.  Pt states headache and dizziness continues today.

## 2016-09-20 NOTE — ED Notes (Signed)
Patient transported to CT 

## 2016-09-20 NOTE — ED Provider Notes (Signed)
,   [facility] Emergency Department Provider Note   ____________________________________________    I have reviewed the triage vital signs and the nursing notes.   HISTORY  Chief Complaint Dizziness     HPI Jerry Welch is a 65 y.o. male who presents with complaints of dizziness. Patient reports a sensation of room spinning that started yesterday at approximately 2 PM. He was in the middle of cleaning a guest bedroom. He attempted to rest but reports the symptoms do not get better. He was concerned that it was related to his blood pressure so he checked his blood pressure numerous times and double up on his blood pressure medications. He was able to sleep last night but this morning he woke up and continued to have vertigo symptoms. He also complains of a mild frontal headache as well. This developed while he was checking his blood pressure several times yesterday. No fevers or chills. No tinnitus or ear pain. No history of vertigo in the past. Has a history of high blood pressure. No neuro deficits   Past Medical History:  Diagnosis Date  . Back pain   . HLD (hyperlipidemia) 04/30/2015  . Hypertension   . Osteoarthrosis involving multiple sites   . Ruptured lumbar disc    L4 & L5    Patient Active Problem List   Diagnosis Date Noted  . HLD (hyperlipidemia) 04/30/2015  . BP (high blood pressure) 04/30/2015  . Arthritis, degenerative 04/30/2015  . Elevated glucose 12/02/2014  . Elevated serum creatinine 12/02/2014  . Leg swelling 12/02/2014  . Obesity 12/02/2014  . Preop cardiovascular exam 10/06/2013  . 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 Abnormal prostate specific antigen 07/23/2013  . Benign prostatic hyperplasia with urinary obstruction 07/23/2013  . Excessive urination at night 07/23/2013  . Decreased urine stream 07/23/2013  . FOM (frequency of micturition) 07/23/2013  . Elevated prostate specific antigen  (PSA) 07/23/2013  . Slowing of urinary stream 07/23/2013  . HTN (hypertension) 07/26/2011  . Somnolence 07/26/2011  . SOB (shortness of breath) 07/26/2011    Past Surgical History:  Procedure Laterality Date  . APPENDECTOMY    . BACK SURGERY  09/2013   L4-L5   . BASAL CELL CARCINOMA EXCISION  2012   right neck   . PROSTATE BIOPSY    . 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 REPLACEMENT TOTAL KNEE     right x 2    Prior to Admission medications   Medication Sig Start Date End Date Taking? Authorizing Provider  cyanocobalamin (,VITAMIN B-12,) 1000 MCG/ML injection Inject into the muscle. 03/07/16  Yes Historical Provider, MD  diclofenac (VOLTAREN) 75 MG EC tablet Take 1 tablet (75 mg total) by mouth 2 (two) times daily. 01/31/16  Yes Max T Hyatt, DPM  losartan-hydrochlorothiazide (HYZAAR) 100-25 MG per tablet Take 1 tablet by mouth daily.    Historical Provider, MD  omeprazole (PRILOSEC) 20 MG capsule Take 20 mg by mouth daily.      Historical Provider, MD  potassium chloride (K-DUR) 10 MEQ tablet  04/14/16   Historical Provider, MD  verapamil (COVERA HS) 240 MG (CO) 24 hr tablet Take 240 mg by mouth 2 (two) times daily.     Historical Provider, MD     Allergies Amlodipine and Lisinopril  Family History  Problem Relation Age of Onset  . Heart failure Mother   . Heart attack Maternal Grandfather   . Bladder Cancer Neg Hx   .  Prostate cancer Neg Hx   . Kidney cancer Neg Hx     Social History Social History  Substance Use Topics  . Smoking status: Former Smoker    Packs/day: 1.00    Types: Cigarettes    Quit date: 06/06/1981  . Smokeless tobacco: Never Used  . Alcohol use 0.5 oz/week    1 Standard drinks or equivalent per week    Review of Systems  Constitutional: No fever/chills Eyes: No visual changes.  ENT: No Neck pain Cardiovascular: Denies chest pain. No palpitations Respiratory: Denies shortness of breath. Gastrointestinal: No nausea or  vomiting  Musculoskeletal: Negative for back pain.  Neurological: Negative for focal weakness  10-point ROS otherwise negative.  ____________________________________________   PHYSICAL EXAM:  VITAL SIGNS: ED Triage Vitals [09/20/16 0902]  Enc Vitals Group     BP (!) 175/104     Pulse Rate 67     Resp 20     Temp 97.6 F (36.4 C)     Temp Source Oral     SpO2 95 %     Weight 210 lb (95.3 kg)     Height      Head Circumference      Peak Flow      Pain Score 6     Pain Loc      Pain Edu?      Excl. in [place]?     Constitutional: Alert and oriented. No acute distress. Pleasant and interactive Eyes: Conjunctivae are normal. Horizontal nystagmus Head: Atraumatic. Nose: No congestion/rhinnorhea. Mouth/Throat: Mucous membranes are moist.   Neck:  Painless ROM Cardiovascular: Normal rate, regular rhythm. Grossly normal heart sounds.  Good peripheral circulation. Respiratory: Normal respiratory effort.  No retractions.  Gastrointestinal: Soft and nontender. No distention.   Genitourinary: deferred Musculoskeletal: No lower extremity tenderness nor edema.  Warm and well perfused Neurologic:  Normal speech and language. No gross focal neurologic deficits are appreciated. Cranial nerves II through XII are normal, normal strength in all extremities Skin:  Skin is warm, dry and intact. No rash noted. Psychiatric: Mood and affect are normal. Speech and behavior are normal.  ____________________________________________   LABS (all labs ordered are listed, but only abnormal results are displayed)  Labs Reviewed  CBC  COMPREHENSIVE METABOLIC PANEL  TROPONIN I   ____________________________________________  EKG  ED ECG REPORT I, Lavonia Drafts, the attending physician, personally viewed and interpreted this ECG.  Date: 09/20/2016 EKG Time: 9:07 AM Rate: 69 Rhythm: normal sinus rhythm QRS Axis: normal Intervals: normal ST/T Wave abnormalities: normal Conduction  Disturbances: none Narrative Interpretation: unremarkable  ____________________________________________  RADIOLOGY  CT head unremarkable ____________________________________________   PROCEDURES  Procedure(s) performed: No    Critical Care performed: No ____________________________________________   INITIAL IMPRESSION / ASSESSMENT AND PLAN / ED COURSE  Pertinent labs & imaging results that were available during my care of the patient were reviewed by me and considered in my medical decision making (see chart for details).  Patient well-appearing and in no acute distress. History of present illness is most consistent with BPV. Suspect headache related to stress/anxiety. Neuro exam is intact. We will check labs, CT head given IV fluids and meclizine and small dose of Ativan and re-evaluate  Clinical Course   Patient reported significant improvement with treatment. We discussed admission for further evaluation but the patient opted to try by mouth meclizine at home to see if his symptoms resolve. He knows he can return at any time if any change or worsening of his  symptoms. Given that he feels improved and his workup is benign I feel this is a reasonable plan. ____________________________________________   FINAL CLINICAL IMPRESSION(S) / ED DIAGNOSES  Final diagnoses:  Vertigo      NEW MEDICATIONS STARTED DURING THIS VISIT:  New Prescriptions   No medications on file     Note:  This document was prepared using Dragon voice recognition software and may include unintentional dictation errors.    Lavonia Drafts, MD 09/20/16 [phone]

## 2016-09-20 NOTE — Telephone Encounter (Signed)
Pt c/o BP issue: STAT if pt c/o blurred vision, one-sided weakness or slurred speech  1. What are your last 5 BP readings? 09/19/16 2pm 168/115, 4pm 175/105, 5pm 168/94. 09/20/16 7 am 178/105, 8:25am 165/108 p 73  2. Are you having any other symptoms (ex. Dizziness, headache, blurred vision, passed out)? Severe headache and dizzy.  3. What is your BP issue? Cant get bp down and severe headache.   Doubled up on verapamil (COVERA HS) 240 MG (CO) 24 hr tablet with tylenol

## 2016-09-20 NOTE — Telephone Encounter (Signed)
Spoke w/ pt's wife.  She reports that pt's BP jumped up yesterday afternoon around 1 pm and SBP has remained >100. Pt took 2 verapamil 240 mg last night and number has not decreased. She is not sure why his BP increased so suddenly, he has tried sitting quietly, but HA is not letting him rest. Pt is dizzy, has trouble standing and has never had a HA this bad. Advised her to take pt to the ED for eval, as she is concerned about HA w/ high BPs. Asked her to call back if we can be of further assistance.

## 2016-10-06 ENCOUNTER — Other Ambulatory Visit: Payer: Medicare Other

## 2016-10-06 DIAGNOSIS — R972 Elevated prostate specific antigen [PSA]: Secondary | ICD-10-CM

## 2016-10-07 LAB — PSA: PROSTATE SPECIFIC AG, SERUM: 8.2 ng/mL — AB (ref 0.0–4.0)

## 2016-10-11 ENCOUNTER — Ambulatory Visit: Payer: BLUE CROSS/BLUE SHIELD | Admitting: Urology

## 2016-10-13 ENCOUNTER — Emergency Department
Admission: EM | Admit: 2016-10-13 | Discharge: 2016-10-13 | Disposition: A | Discharge status: Home / Self Care | Payer: BLUE CROSS/BLUE SHIELD | Attending: Emergency Medicine | Admitting: Emergency Medicine

## 2016-10-13 ENCOUNTER — Ambulatory Visit: Payer: BLUE CROSS/BLUE SHIELD | Admitting: Urology

## 2016-10-13 ENCOUNTER — Encounter: Payer: Self-pay | Admitting: Intensive Care

## 2016-10-13 ENCOUNTER — Emergency Department: Payer: BLUE CROSS/BLUE SHIELD

## 2016-10-13 DIAGNOSIS — I10 Essential (primary) hypertension: Secondary | ICD-10-CM | POA: Insufficient documentation

## 2016-10-13 DIAGNOSIS — Z87891 Personal history of nicotine dependence: Secondary | ICD-10-CM | POA: Insufficient documentation

## 2016-10-13 DIAGNOSIS — J209 Acute bronchitis, unspecified: Secondary | ICD-10-CM | POA: Insufficient documentation

## 2016-10-13 DIAGNOSIS — Z79899 Other long term (current) drug therapy: Secondary | ICD-10-CM | POA: Diagnosis not present

## 2016-10-13 DIAGNOSIS — R05 Cough: Secondary | ICD-10-CM | POA: Diagnosis present

## 2016-10-13 MED ORDER — IPRATROPIUM-ALBUTEROL 0.5-2.5 (3) MG/3ML IN SOLN
3.0000 mL | Freq: Once | RESPIRATORY_TRACT | Status: AC
  Administered 2016-10-13: 3 mL via RESPIRATORY_TRACT
  Filled 2016-10-13: qty 3

## 2016-10-13 MED ORDER — PREDNISONE 10 MG PO TABS: ORAL_TABLET | ORAL | 0 refills | Status: DC

## 2016-10-13 MED ORDER — ALBUTEROL SULFATE HFA 108 (90 BASE) MCG/ACT IN AERS: 2.0000 | INHALATION_SPRAY | Freq: Four times a day (QID) | RESPIRATORY_TRACT | 2 refills | Status: DC | PRN

## 2016-10-13 MED ORDER — HYDROCOD POLST-CPM POLST ER 10-8 MG/5ML PO SUER: 5.0000 mL | Freq: Two times a day (BID) | ORAL | 0 refills | Status: DC | PRN

## 2016-10-13 NOTE — ED Provider Notes (Signed)
[facility] Emergency Department Provider Note  ____________________________________________   First MD Initiated Contact with Patient 10/13/16 [phone]     (approximate)  I have reviewed the triage vital signs and the nursing notes.   HISTORY  Chief Complaint Cough    HPI Jerry Welch is a 66 y.o. male is no complaint of cough and shortness of breath for the last 3 days. Patient states that he has had a temperature at home but took Tylenol this morning to relieve his symptoms.Patient states that he has chest discomfort with coughing. He is also heard himself wheezing at times. Patient states he was a smoker but stopped 35 years ago. He states that 2 years ago he had an episode where he had to be on "breathing treatments" and steroids. He is unaware of any history of bronchitis or pneumonia. He has not had any exposure to the fluid. He states his symptoms came on gradually. Over-the-counter medication has not helped with his cough.   Past Medical History:  Diagnosis Date  . Back pain   . HLD (hyperlipidemia) 04/30/2015  . Hypertension   . Osteoarthrosis involving multiple sites   . Ruptured lumbar disc    L4 & L5    Patient Active Problem List   Diagnosis Date Noted  . HLD (hyperlipidemia) 04/30/2015  . BP (high blood pressure) 04/30/2015  . Arthritis, degenerative 04/30/2015  . Elevated glucose 12/02/2014  . Elevated serum creatinine 12/02/2014  . Leg swelling 12/02/2014  . Obesity 12/02/2014  . Preop cardiovascular exam 10/06/2013  . 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 Abnormal prostate specific antigen 07/23/2013  . Benign prostatic hyperplasia with urinary obstruction 07/23/2013  . Excessive urination at night 07/23/2013  . Decreased urine stream 07/23/2013  . FOM (frequency of micturition) 07/23/2013  . Elevated prostate specific antigen (PSA) 07/23/2013  . Slowing of urinary stream 07/23/2013  . HTN  (hypertension) 07/26/2011  . Somnolence 07/26/2011  . SOB (shortness of breath) 07/26/2011    Past Surgical History:  Procedure Laterality Date  . APPENDECTOMY    . BACK SURGERY  09/2013   L4-L5   . BASAL CELL CARCINOMA EXCISION  2012   right neck   . PROSTATE BIOPSY    . 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 REPLACEMENT TOTAL KNEE     right x 2    Prior to Admission medications   Medication Sig Start Date End Date Taking? Authorizing Provider  albuterol (PROVENTIL HFA;VENTOLIN HFA) 108 (90 Base) MCG/ACT inhaler Inhale 2 puffs into the lungs every 6 (six) hours as needed for wheezing or shortness of breath. 10/13/16   Johnn Hai, PA-C  chlorpheniramine-HYDROcodone (TUSSIONEX PENNKINETIC ER) 10-8 MG/5ML SUER Take 5 mLs by mouth every 12 (twelve) hours as needed for cough. 10/13/16   Johnn Hai, PA-C  cyanocobalamin (,VITAMIN B-12,) 1000 MCG/ML injection Inject into the muscle. 03/07/16   Historical Provider, MD  diclofenac (VOLTAREN) 75 MG EC tablet Take 1 tablet (75 mg total) by mouth 2 (two) times daily. 01/31/16   Max T Hyatt, DPM  losartan-hydrochlorothiazide (HYZAAR) 100-25 MG per tablet Take 1 tablet by mouth daily.    Historical Provider, MD  meclizine (ANTIVERT) 25 MG tablet Take 1 tablet (25 mg total) by mouth 3 (three) times daily as needed for dizziness. 09/20/16   Lavonia Drafts, MD  omeprazole (PRILOSEC) 20 MG capsule Take 20 mg by mouth daily.      Historical Provider,  MD  potassium chloride (K-DUR) 10 MEQ tablet  04/14/16   Historical Provider, MD  predniSONE (DELTASONE) 10 MG tablet Take 6 tablets  today, on day 2 take 5 tablets, day 3 take 4 tablets, day 4 take 3 tablets, day 5 take  2 tablets and 1 tablet the last day 10/13/16   Johnn Hai, PA-C  verapamil (COVERA HS) 240 MG (CO) 24 hr tablet Take 240 mg by mouth 2 (two) times daily.     Historical Provider, MD    Allergies Amlodipine and Lisinopril  Family History  Problem Relation Age of Onset    . Heart failure Mother   . Heart attack Maternal Grandfather   . Bladder Cancer Neg Hx   . Prostate cancer Neg Hx   . Kidney cancer Neg Hx     Social History Social History  Substance Use Topics  . Smoking status: Former Smoker    Packs/day: 1.00    Types: Cigarettes    Quit date: 06/06/1981  . Smokeless tobacco: Never Used  . Alcohol use 0.5 oz/week    1 Standard drinks or equivalent per week    Review of Systems Constitutional: Positive fever/chills Eyes: No visual changes. ENT: No sore throat. Cardiovascular: Denies chest pain. Positive chest discomfort with coughing. Respiratory: Denies shortness of breath. Positive productive cough. Gastrointestinal: No abdominal pain.  No nausea, no vomiting.  No diarrhea.   Musculoskeletal: Positive chest wall pain increased with coughing. Skin: Negative for rash. Neurological: Negative for headaches, focal weakness or numbness.  10-point ROS otherwise negative.  ____________________________________________   PHYSICAL EXAM:  VITAL SIGNS: ED Triage Vitals  Enc Vitals Group     BP 10/13/16 0909 (!) 149/86     Pulse Rate 10/13/16 0909 71     Resp 10/13/16 0909 17     Temp 10/13/16 0909 98.3 F (36.8 C)     Temp Source 10/13/16 0909 Oral     SpO2 10/13/16 0909 93 %     Weight 10/13/16 0910 211 lb (95.7 kg)     Height 10/13/16 0910 5\' 8"  (1.727 m)     Head Circumference --      Peak Flow --      Pain Score --      Pain Loc --      Pain Edu? --      Excl. in [place]? --     Constitutional: Alert and oriented. Well appearing and in no acute distress. Eyes: Conjunctivae are normal. PERRL. EOMI. Head: Atraumatic. Nose: Mild to moderate congestion/rhinnorhea.  Bases are clear. TMs are dull bilaterally. Mouth/Throat: Mucous membranes are moist.  Oropharynx non-erythematous. Neck: No stridor.   Hematological/Lymphatic/Immunilogical: No cervical lymphadenopathy. Cardiovascular: Normal rate, regular rhythm. Grossly normal heart  sounds.  Good peripheral circulation. Respiratory: Normal respiratory effort.  No retractions. Lungs bilateral expiratory wheeze is heard. The patient is still able to speak in complete sentences without any difficulty.Coarse cough during exam. Gastrointestinal: Soft and nontender. No distention. Musculoskeletal: Moves upper and lower extremities and a difficulty. Normal gait was noted. Neurologic:  Normal speech and language. No gross focal neurologic deficits are appreciated. No gait instability. Skin:  Skin is warm, dry and intact. No rash noted. Psychiatric: Mood and affect are normal. Speech and behavior are normal.  ____________________________________________   LABS (all labs ordered are listed, but only abnormal results are displayed)  Labs Reviewed - No data to display  RADIOLOGY  Chest x-ray per radiologist is negative for acute disease. I, Lousie Calico L  Madalyn Rob, personally viewed and evaluated these images (plain radiographs) as part of my medical decision making, as well as reviewing the written report by the radiologist. ____________________________________________   PROCEDURES  Procedure(s) performed: None  Procedures  Critical Care performed: No  ____________________________________________   INITIAL IMPRESSION / ASSESSMENT AND PLAN / ED COURSE  Pertinent labs & imaging results that were available during my care of the patient were reviewed by me and considered in my medical decision making (see chart for details).   Patient was given nebulizer treatment while in the emergency room and improved. Patient states that he feels as though he is moving more air with less effort. He is given a prescription for Proventil inhaler 2 puffs 4 times a day, prednisone 60 mg 6 day taper. He is also given a prescription for Tussionex 1 teaspoon every 12 hours as needed for cough. He was made aware of this medication could cause drowsiness increase his risk for falling and that he  should not be driving while taking this. He'll follow up with his primary care doctor at [facility] if any continued problems.    ___________________________________________   FINAL CLINICAL IMPRESSION(S) / ED DIAGNOSES  Final diagnoses:  Acute bronchitis, unspecified organism      NEW MEDICATIONS STARTED DURING THIS VISIT:  Discharge Medication List as of 10/13/2016 10:52 AM    START taking these medications   Details  albuterol (PROVENTIL HFA;VENTOLIN HFA) 108 (90 Base) MCG/ACT inhaler Inhale 2 puffs into the lungs every 6 (six) hours as needed for wheezing or shortness of breath., Starting Fri 10/13/2016, Print    chlorpheniramine-HYDROcodone (TUSSIONEX PENNKINETIC ER) 10-8 MG/5ML SUER Take 5 mLs by mouth every 12 (twelve) hours as needed for cough., Starting Fri 10/13/2016, Print    predniSONE (DELTASONE) 10 MG tablet Take 6 tablets  today, on day 2 take 5 tablets, day 3 take 4 tablets, day 4 take 3 tablets, day 5 take  2 tablets and 1 tablet the last day, Print         Note:  This document was prepared using Dragon voice recognition software and may include unintentional dictation errors.    Johnn Hai, PA-C 10/13/16 [place] Yao, MD 10/16/16 [phone]

## 2016-10-13 NOTE — ED Triage Notes (Addendum)
Patient presents to ER with productive cough X3 days. States brown sputum. Reports having chest pressure when he coughs. Denies N/V/D. Unlabored breathing in triage. A&O x4. NAD noted. Denies fevers

## 2016-10-13 NOTE — ED Provider Notes (Signed)
EKG reviewed and interpreted at 9:05 AM Heart rate 66 PR 158 QRS 95 QTC 440 Normal sinus rhythm, no acute ischemic changes noted, minimal nonspecific abnormality seen   Delman Kitten, MD 10/13/16 1101

## 2016-10-13 NOTE — ED Notes (Signed)
See triage note  Prod cough for the past 3 days  No fever or chills  resp even and non labored

## 2016-10-13 NOTE — Discharge Instructions (Signed)
Follow-up with your primary care doctor if not improving in 4-5 days. Begin taking medication as directed. Prednisone 6 day taper, albuterol inhaler 2 puffs 4 times a day, and Tussionex suspension 1 teaspoon every 12 hours as needed for severe cough. This medication could cause drowsiness so do not drive or operative machinery while taking this medication.

## 2016-10-17 ENCOUNTER — Encounter: Payer: Self-pay | Admitting: Cardiovascular Disease

## 2016-10-17 ENCOUNTER — Ambulatory Visit (INDEPENDENT_AMBULATORY_CARE_PROVIDER_SITE_OTHER): Payer: BLUE CROSS/BLUE SHIELD | Admitting: Cardiovascular Disease

## 2016-10-17 VITALS — BP 130/80 | HR 65 | Resp 18 | Ht 68.0 in | Wt 216.5 lb

## 2016-10-17 DIAGNOSIS — I10 Essential (primary) hypertension: Secondary | ICD-10-CM

## 2016-10-17 DIAGNOSIS — E6609 Other obesity due to excess calories: Secondary | ICD-10-CM

## 2016-10-17 DIAGNOSIS — Z6835 Body mass index (BMI) 35.0-35.9, adult: Secondary | ICD-10-CM

## 2016-10-17 DIAGNOSIS — E782 Mixed hyperlipidemia: Secondary | ICD-10-CM

## 2016-10-17 DIAGNOSIS — M7989 Other specified soft tissue disorders: Secondary | ICD-10-CM | POA: Diagnosis not present

## 2016-10-17 NOTE — Progress Notes (Signed)
Cardiology Office Note  Date:  10/17/2016   ID:  Welch, Jerry [birth year], MRN [id number]  PCP:  [name], MD   Chief Complaint  Patient presents with  . other    HPI:  Jerry Welch  is a pleasant 66 year old gentleman, patient of Dr. Kary Kos, with history of mild obesity, hypertension, migraines and osteoarthritis who presents for follow up of his hypertension,  and shortness of breath. Smoked for 20 yr, until 1982  He is getting over bronchitis, on antibiotics, prednisone taper  He reports havingContinued lower extremity swelling Still on verapamil 240 mg twice a day On his last clinic visit we try to decrease the verapamil, start him on alternative blood pressure pills such as Cardura. He reports that he prefers to stay on verapamil  Swelling Worse on the left leg than the right, this is been a chronic issue  He denies any chest pain or shortness of breath on exertion  Lab work reviewed with him in detail creatinine   1 Total cholesterol reviewed, 147, LDL 77, hemoglobin A1c 5.7  PSA is elevated, Had MRI prostate  Recent EKG performed while he was in the emergency room for bronchitis This showed normal sinus rhythm, no significant ST or T-wave changes  Other past medical history History of severe back pain.  trouble started 30 years ago with a bulge disc.   ultrasound September 2013 showing hepatic steatohepatitis,     Previous lab work by Dr. Kary Kos August 2013 shows total cholesterol 154, LDL 87, HDL 40   PMH:   has a past medical history of Back pain; HLD (hyperlipidemia) (04/30/2015); Hypertension; Osteoarthrosis involving multiple sites; and Ruptured lumbar disc.  PSH:    Past Surgical History:  Procedure Laterality Date  . APPENDECTOMY    . BACK SURGERY  09/2013   L4-L5   . BASAL CELL CARCINOMA EXCISION  2012   right neck   . PROSTATE BIOPSY    . 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 REPLACEMENT TOTAL KNEE     right x 2     Current Outpatient Prescriptions  Medication Sig Dispense Refill  . albuterol (PROVENTIL HFA;VENTOLIN HFA) 108 (90 Base) MCG/ACT inhaler Inhale 2 puffs into the lungs every 6 (six) hours as needed for wheezing or shortness of breath. 1 Inhaler 2  . chlorpheniramine-HYDROcodone (TUSSIONEX PENNKINETIC ER) 10-8 MG/5ML SUER Take 5 mLs by mouth every 12 (twelve) hours as needed for cough. 120 mL 0  . cyanocobalamin (,VITAMIN B-12,) 1000 MCG/ML injection Inject into the muscle.    . losartan-hydrochlorothiazide (HYZAAR) 100-25 MG per tablet Take 1 tablet by mouth daily.    Marland Kitchen omeprazole (PRILOSEC) 20 MG capsule Take 20 mg by mouth daily.      . potassium chloride (K-DUR) 10 MEQ tablet     . verapamil (COVERA HS) 240 MG (CO) 24 hr tablet Take 240 mg by mouth 2 (two) times daily.     . diclofenac (VOLTAREN) 75 MG EC tablet Take 1 tablet (75 mg total) by mouth 2 (two) times daily. (Patient not taking: Reported on 10/17/2016) 60 tablet 1  . meclizine (ANTIVERT) 25 MG tablet Take 1 tablet (25 mg total) by mouth 3 (three) times daily as needed for dizziness. (Patient not taking: Reported on 10/17/2016) 20 tablet 0  . predniSONE (DELTASONE) 10 MG tablet Take 6 tablets  today, on day 2 take 5 tablets, day 3 take 4 tablets, day 4 take 3 tablets, day 5 take  2 tablets and 1 tablet the last day (Patient not taking: Reported on 10/17/2016) 21 tablet 0   No current facility-administered medications for this visit.      Allergies:   Amlodipine and Lisinopril   Social History:  The patient  reports that he quit smoking about 35 years ago. His smoking use included Cigarettes. He smoked 1.00 pack per day. He has never used smokeless tobacco. He reports that he drinks about 0.5 oz of alcohol per week . He reports that he does not use drugs.   Family History:   family history includes Heart attack in his maternal grandfather; Heart failure in his mother.    Review of Systems: Review of Systems  Constitutional:  Negative.   Respiratory: Positive for cough.   Cardiovascular: Negative.   Gastrointestinal: Negative.   Musculoskeletal: Negative.   Neurological: Negative.   Psychiatric/Behavioral: Negative.   All other systems reviewed and are negative.    PHYSICAL EXAM: VS:  BP 130/80 (BP Location: Left Arm, Patient Position: Sitting, Cuff Size: Normal)   Pulse 65   Resp 18   Ht 5\' 8"  (1.727 m)   Wt 216 lb 8 oz (98.2 kg)   BMI 32.92 kg/m  , BMI Body mass index is 32.92 kg/m. GEN: Well nourished, well developed, in no acute distress  HEENT: normal  Neck: no JVD, carotid bruits, or masses Cardiac: RRR; no murmurs, rubs, or gallops, trace pitting bilateral lower extremity edema  Respiratory:  clear to auscultation bilaterally, scant rales right side, normal work of breathing GI: soft, nontender, nondistended, + BS MS: no deformity or atrophy  Skin: warm and dry, no rash Neuro:  Strength and sensation are intact Psych: euthymic mood, full affect    Recent Labs: 09/20/2016: ALT 23; BUN 9; Creatinine, Ser 1.16; Hemoglobin 16.7; Platelets 192; Potassium 3.9; Sodium 138    Lipid Panel No results found for: CHOL, HDL, LDLCALC, TRIG    Wt Readings from Last 3 Encounters:  10/17/16 216 lb 8 oz (98.2 kg)  10/13/16 211 lb (95.7 kg)  09/20/16 210 lb (95.3 kg)       ASSESSMENT AND PLAN:  Essential hypertension Blood pressure is well controlled on today's visit. No changes made to the medications. He prefers to stay on verapamil despite leg swelling  Mixed hyperlipidemia Cholesterol is at goal on the current lipid regimen. No changes to the medications were made.  Leg swelling Previous attempts to wean him off verapamil were unsuccessful He prefers to stay on verapamil high-dose twice a day Recommended compression hose  Class 2 obesity due to excess calories without serious comorbidity with body mass index (BMI) of 35.0 to 35.9 in adult We have encouraged continued exercise,  careful diet management in an effort to lose weight.   Total encounter time more than 15 minutes  Greater than 50% was spent in counseling and coordination of care with the patient   Disposition:   F/U  6 months  No orders of the defined types were placed in this encounter.    Signed, Esmond Plants, M.D., Ph.D. 10/17/2016  [employer], [place]

## 2016-10-17 NOTE — Patient Instructions (Signed)

## 2016-10-24 ENCOUNTER — Encounter: Payer: Self-pay | Admitting: Urology

## 2016-10-24 ENCOUNTER — Ambulatory Visit (INDEPENDENT_AMBULATORY_CARE_PROVIDER_SITE_OTHER): Payer: BLUE CROSS/BLUE SHIELD | Admitting: Urology

## 2016-10-24 VITALS — BP 154/83 | HR 69 | Ht 68.0 in | Wt 217.0 lb

## 2016-10-24 DIAGNOSIS — R350 Frequency of micturition: Secondary | ICD-10-CM | POA: Diagnosis not present

## 2016-10-24 DIAGNOSIS — R972 Elevated prostate specific antigen [PSA]: Secondary | ICD-10-CM

## 2016-10-24 DIAGNOSIS — N528 Other male erectile dysfunction: Secondary | ICD-10-CM | POA: Diagnosis not present

## 2016-10-24 NOTE — Progress Notes (Signed)
5:00 PM   10/24/16  NOE GLOD [birth year] [id number]  Referring provider: [name], MD [address] [facility] [place], [place] [address]  Chief Complaint  Patient presents with  . Erectile Dysfunction    6 months w/PSA    HPI: 66 year-old male with multiple GU issuse  History of elevated PSA No family history of prostate cancer Status post prostate biopsy on 09/08/2013 at which time his PSA was 4.8. revealing several foci of high-grade PIN and with a focus at the right base. His TRUS volume at the time was 57 cc.   His PSA subsequently rose to 7.07 on 05/2014 and he went repeat prostate biopsy on 09/2014 showed chronic inflammation but no evidence of malignancy, repeat TRUS 24 cc. Endorectal MRI 02/2016 with equivical, no evidence of high grade diease.    PSA 8.3 on 03/2016, DRE benign which was up from 6.9 on 04/2015 but relatively stable from 11/18/15. Most recent PSA 8.2 on 10/06/16  BPH with LUTS Nocturia improving x 0-2 with CPAP He does have some mild frequency, urgency No benefit from Vesicare 10 mg samples He denies episodes of urge incontinence.  He reports that he has tried Flomax in the past without any benefit He does drink a good amount water and other beverages throughout the day including occasional soda/  Coffee. He does also have baseline CKD,  Cr 1.16 on 08/2016 Overall does not want to keep trying medication  ED He does also report baseline erectile dysfunction but is not particularly bothered by this at this time.    PMH: Past Medical History:  Diagnosis Date  . Back pain   . HLD (hyperlipidemia) 04/30/2015  . Hypertension   . Osteoarthrosis involving multiple sites   . Ruptured lumbar disc    L4 & L5    Surgical History: Past Surgical History:  Procedure Laterality Date  . APPENDECTOMY    . BACK SURGERY  09/2013   L4-L5   . BASAL CELL CARCINOMA EXCISION  2012   right neck   . PROSTATE BIOPSY    . REPLACEMENT TOTAL  KNEE  2002   left knee   . REPLACEMENT TOTAL KNEE     right x 2    Home Medications:  Allergies as of 10/24/2016      Reactions   Amlodipine Other (See Comments)   LE EDEMA   Lisinopril    Cough-onset      Medication List       Accurate as of 10/24/16  5:00 PM. Always use your most recent med list.          albuterol 108 (90 Base) MCG/ACT inhaler Commonly known as:  PROVENTIL HFA;VENTOLIN HFA Inhale 2 puffs into the lungs every 6 (six) hours as needed for wheezing or shortness of breath.   cyanocobalamin 1000 MCG/ML injection Commonly known as:  (VITAMIN B-12) Inject into the muscle.   diclofenac 75 MG EC tablet Commonly known as:  VOLTAREN Take 1 tablet (75 mg total) by mouth 2 (two) times daily.   losartan-hydrochlorothiazide 100-25 MG tablet Commonly known as:  HYZAAR Take 1 tablet by mouth daily.   meclizine 25 MG tablet Commonly known as:  ANTIVERT Take 1 tablet (25 mg total) by mouth 3 (three) times daily as needed for dizziness.   omeprazole 20 MG capsule Commonly known as:  PRILOSEC Take 20 mg by mouth daily.   potassium chloride 10 MEQ tablet Commonly known as:  K-DUR  verapamil 240 MG (CO) 24 hr tablet Commonly known as:  COVERA HS Take 240 mg by mouth 2 (two) times daily.       Allergies:  Allergies  Allergen Reactions  . Amlodipine Other (See Comments)    LE EDEMA  . Lisinopril     Cough-onset     Family History: Family History  Problem Relation Age of Onset  . Heart failure Mother   . Heart attack Maternal Grandfather   . Bladder Cancer Neg Hx   . Prostate cancer Neg Hx   . Kidney cancer Neg Hx     Social History:  reports that he quit smoking about 35 years ago. His smoking use included Cigarettes. He smoked 1.00 pack per day. He has never used smokeless tobacco. He reports that he drinks about 0.5 oz of alcohol per week . He reports that he does not use drugs.  ROS: 12 point review systems negative other than as per history  of present illness.  Physical Exam: BP (!) 154/83   Pulse 69   Ht 5\' 8"  (1.727 m)   Wt 217 lb (98.4 kg)   BMI 32.99 kg/m   Constitutional:  Alert and oriented, No acute distress. Wife present today.  HEENT: [place] AT, moist mucus membranes.  Trachea midline, no masses. Cardiovascular: No clubbing, cyanosis, or edema. Respiratory: Normal respiratory effort, no increased work of breathing. Skin: No rashes, bruises or suspicious lesions. Rectal exam: Deferred today, stable in 04/11/16 Neurologic: Grossly intact, no focal deficits, moving all 4 extremities. Psychiatric: Normal mood and affect.  Laboratory Data:  Component     Latest Ref Rng 04/30/2015 11/18/2015 04/11/2016  PSA     0.0 - 4.0 ng/mL 6.9 (H) 8.0 (H) 8.3 (H)    Urinalysis Results for orders placed or performed in visit on 10/06/16  PSA  Result Value Ref Range   Prostate Specific Ag, Serum 8.2 (H) 0.0 - 4.0 ng/mL     Assessment & Plan:  66 year old male with history of elevated PSA status post negative biopsy 2. And s/p endorectal MRI without any significant obvious tumors but several subtle findings.   Most recent PSA stable.    1. Elevated PSA PSA stable/ DRE deferred until next visit Options including repeat biopsy, finasteride therapy discussed Recommend continued surveillance  Continue to follow in 6 months with PSA/ DRE  2. Urinary frequency Not interested in pharmacotherapy.  3. Other male erectile dysfunction No intervention desired at this time, we will continue to follow.   Return in about 6 months (around 04/23/2017) for PSA/ DRE.  Hollice Espy, MD  [facility] [address] [place], [place] [address] [phone]

## 2016-11-24 ENCOUNTER — Ambulatory Visit: Payer: Medicare Other | Admitting: Urology

## 2017-04-16 ENCOUNTER — Other Ambulatory Visit: Payer: Medicare Other

## 2017-04-16 DIAGNOSIS — R972 Elevated prostate specific antigen [PSA]: Secondary | ICD-10-CM

## 2017-04-17 LAB — PSA: PROSTATE SPECIFIC AG, SERUM: 9.6 ng/mL — AB (ref 0.0–4.0)

## 2017-04-18 ENCOUNTER — Ambulatory Visit: Payer: Medicare Other | Admitting: Urology

## 2017-04-18 ENCOUNTER — Encounter: Payer: Self-pay | Admitting: Urology

## 2017-04-18 ENCOUNTER — Ambulatory Visit (INDEPENDENT_AMBULATORY_CARE_PROVIDER_SITE_OTHER): Payer: BLUE CROSS/BLUE SHIELD | Admitting: Urology

## 2017-04-18 VITALS — BP 124/73 | HR 72 | Ht 70.0 in | Wt 215.0 lb

## 2017-04-18 DIAGNOSIS — R972 Elevated prostate specific antigen [PSA]: Secondary | ICD-10-CM

## 2017-04-18 DIAGNOSIS — N528 Other male erectile dysfunction: Secondary | ICD-10-CM

## 2017-04-18 DIAGNOSIS — R350 Frequency of micturition: Secondary | ICD-10-CM | POA: Diagnosis not present

## 2017-04-18 NOTE — Progress Notes (Signed)
3:29 PM   04/18/17  Jerolyn Shin March 31, 1951 [id number]  Referring provider: [name], MD [address] [facility] [place], [place] [address]  Chief Complaint  Patient presents with  . Elevated PSA    34month    HPI: 66 year-old male with multiple GU issues  History of elevated PSA No family history of prostate cancer Status post prostate biopsy on 09/08/2013 at which time his PSA was 4.8. revealing several foci of high-grade PIN and with a focus at the right base. His TRUS volume at the time was 57 cc.   His PSA subsequently rose to 7.07 on 05/2014 and he went repeat prostate biopsy on 09/2014 showed chronic inflammation but no evidence of malignancy, repeat TRUS 24 cc. Endorectal MRI 02/2016 was equivical, no evidence of high grade diease.    PSA continues to rise slowly, most recently up to 9.6 from 8.2  BPH with LUTS Nocturia improving x 0-2 with CPAP He does have some mild frequency, urgency No benefit from Vesicare 10 mg samples He denies episodes of urge incontinence.  He reports that he has tried Flomax in the past without any benefit He does drink a good amount water and other beverages throughout the day including occasional soda/  Coffee. He does also have baseline CKD,  Cr 1.16 on 08/2016 Overall does not want to keep trying medication  ED He does also report baseline erectile dysfunction but is not particularly bothered by this at this time.    PMH: Past Medical History:  Diagnosis Date  . Back pain   . HLD (hyperlipidemia) 04/30/2015  . Hypertension   . Osteoarthrosis involving multiple sites   . Ruptured lumbar disc    L4 & L5    Surgical History: Past Surgical History:  Procedure Laterality Date  . APPENDECTOMY    . BACK SURGERY  09/2013   L4-L5   . BASAL CELL CARCINOMA EXCISION  2012   right neck   . PROSTATE BIOPSY    . 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 REPLACEMENT TOTAL KNEE     right x 2    Home  Medications:  Allergies as of 04/18/2017      Reactions   Amlodipine Other (See Comments)   LE EDEMA   Lisinopril    Cough-onset      Medication List       Accurate as of 04/18/17  3:29 PM. Always use your most recent med list.          albuterol 108 (90 Base) MCG/ACT inhaler Commonly known as:  PROVENTIL HFA;VENTOLIN HFA Inhale 2 puffs into the lungs every 6 (six) hours as needed for wheezing or shortness of breath.   cyanocobalamin 1000 MCG/ML injection Commonly known as:  (VITAMIN B-12) Inject into the muscle.   diclofenac sodium 1 % Gel Commonly known as:  VOLTAREN Apply topically.   losartan-hydrochlorothiazide 100-25 MG tablet Commonly known as:  HYZAAR Take 1 tablet by mouth daily.   omeprazole 20 MG capsule Commonly known as:  PRILOSEC Take 20 mg by mouth daily.   potassium chloride 10 MEQ tablet Commonly known as:  K-DUR   verapamil 240 MG (CO) 24 hr tablet Commonly known as:  COVERA HS Take 240 mg by mouth 2 (two) times daily.       Allergies:  Allergies  Allergen Reactions  . Amlodipine Other (See Comments)    LE EDEMA  . Lisinopril  Cough-onset     Family History: Family History  Problem Relation Age of Onset  . Heart failure Mother   . Heart attack Maternal Grandfather   . Bladder Cancer Neg Hx   . Prostate cancer Neg Hx   . Kidney cancer Neg Hx     Social History:  reports that he quit smoking about 35 years ago. His smoking use included Cigarettes. He smoked 1.00 pack per day. He has never used smokeless tobacco. He reports that he drinks about 0.5 oz of alcohol per week . He reports that he does not use drugs.  ROS: 12 point review systems negative other than as per history of present illness.  Physical Exam: BP 124/73   Pulse 72   Ht 5\' 10"  (1.778 m)   Wt 215 lb (97.5 kg)   BMI 30.85 kg/m   Constitutional:  Alert and oriented, No acute distress HEENT: [place] AT, moist mucus membranes.  Trachea midline, no  masses. Cardiovascular: No clubbing, cyanosis, or edema. Respiratory: Normal respiratory effort, no increased work of breathing. Skin: No rashes, bruises or suspicious lesions. Rectal exam: Normal sphincter tone, 40 cc prostate, no masses or nodules, nontender Neurologic: Grossly intact, no focal deficits, moving all 4 extremities. Psychiatric: Normal mood and affect.  Laboratory Data:  Component     Latest Ref Rng & Units 04/30/2015 11/18/2015 04/11/2016 10/06/2016  Prostate Specific Ag, Serum     0.0 - 4.0 ng/mL 6.9 (H) 8.0 (H) 8.3 (H) 8.2 (H)   Component     Latest Ref Rng & Units 04/16/2017  Prostate Specific Ag, Serum     0.0 - 4.0 ng/mL 9.6 (H)    Urinalysis   Assessment & Plan:  66 year-old male with history of elevated PSA status post negative biopsy 2. And s/p endorectal MRI without any significant obvious tumors but several subtle findings.   PSA continues to rise slowly which is concerning.  1. Elevated PSA PSA continues to rise in a stepwise fashion Review of previous MRIs to show lesion which is somewhat equivocal I have recommended repeat biopsy, either saturation in the operating room with additional cores were attempted targeted biopsy using MRI fusion.  Risks and benefits of each were discussed.  He is mostly stress an MRI fusion biopsy. We'll arrange for this to be done and [facility]. All questions answered. We will notify him after the procedure with the results and follow-up plan.  2. Urinary frequency Not interested in pharmacotherapy.  3. Other male erectile dysfunction No intervention desired at this time, we will continue to follow.   Schedule MRI fusion biopsy  Hollice Espy, MD  [facility] [address] [place], [place], [place] [address] [phone]

## 2017-05-01 ENCOUNTER — Telehealth: Payer: Self-pay | Admitting: Urology

## 2017-05-01 NOTE — Telephone Encounter (Signed)
Just spoke with Tammy @ [facility] and she has the patient scheduled for his fusion biopsy on 05-16-17 with Dr. Jeffie Pollock. The patient wanted to push it out because he is leaving for [place] in the morning and wanted to wait until he came back to have it done.  Sharyn Lull

## 2017-05-22 ENCOUNTER — Encounter: Payer: Self-pay | Admitting: Urology

## 2017-05-22 ENCOUNTER — Other Ambulatory Visit: Payer: Self-pay | Admitting: Urology

## 2017-05-22 ENCOUNTER — Ambulatory Visit (INDEPENDENT_AMBULATORY_CARE_PROVIDER_SITE_OTHER): Payer: BLUE CROSS/BLUE SHIELD | Admitting: Urology

## 2017-05-22 ENCOUNTER — Other Ambulatory Visit: Payer: Self-pay | Admitting: Radiology

## 2017-05-22 VITALS — BP 121/74 | HR 83 | Ht 70.0 in | Wt 218.0 lb

## 2017-05-22 DIAGNOSIS — C61 Malignant neoplasm of prostate: Secondary | ICD-10-CM

## 2017-05-22 DIAGNOSIS — R350 Frequency of micturition: Secondary | ICD-10-CM | POA: Diagnosis not present

## 2017-05-22 DIAGNOSIS — N528 Other male erectile dysfunction: Secondary | ICD-10-CM | POA: Diagnosis not present

## 2017-05-22 NOTE — Progress Notes (Signed)
Done

## 2017-05-22 NOTE — Progress Notes (Signed)
1:29 PM   05/22/17  Jerolyn Shin January 12, 1951 [id number]  Referring provider: [name], MD [address] [facility] [place], [place] [address]  Chief Complaint  Patient presents with  . 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   History of elevated PSA Status post prostate biopsy on 09/08/2013 at which time his PSA was 4.8. revealing several foci of high-grade PIN and with a focus at the right base. His TRUS volume at the time was 57 cc.   His PSA subsequently rose to 7.07 on 05/2014 and he went repeat prostate biopsy on 09/2014 showed chronic inflammation but no evidence of malignancy, repeat TRUS 24 cc. Endorectal MRI 02/2016 was equivical, no evidence of high grade diease.    PSA continues to rise slowly, most recently up to 9.6 from 8.2 He is now status post fusion biopsy. This revealed 1 core Gleason 3+4 using the standard template on the left lateral mid prostate, 5%. Additionally, at the MRI lesion #2, all 3 cores were positive for Gleason 3+4 disease ranging from 40-50% (anterior left mid). Previous abdominal surgeries include open appendectomy with subsequent lower abdominal midline exploratory laparoscopy for an abscess 34 years ago. He's also had left inguinal hernia repair at age 38.  BPH with LUTS Nocturia improving x 0-2 with CPAP He does have some mild frequency, urgency He does also have baseline CKD,  Cr 1.16 on 08/2016 Overall does not want to keep trying medication  ED He does also report baseline erectile dysfunction but is not particularly bothered by this at this time.      IPSS    Row Name 05/22/17 1300         International Prostate Symptom Score   How often have you had the sensation of not emptying your bladder? More than half the time     How often have you had to  urinate less than every two hours? Almost always     How often have you found you stopped and started again several times when you urinated? Almost always     How often have you found it difficult to postpone urination? Almost always     How often have you had a weak urinary stream? About half the time     How often have you had to strain to start urination? About half the time     How many times did you typically get up at night to urinate? 3 Times     Total IPSS Score 28       Quality of Life due to urinary symptoms   If you were to spend the rest of your life with your urinary condition just the way it is now how would you feel about that? Mixed        Score:  1-7 Mild 8-19 Moderate 20-35 Severe      SHIM    Row Name 05/22/17 1300         SHIM: Over the last 6 months:   How do you rate your confidence that you could get and keep an erection? Very Low     When you had erections with sexual stimulation, how often were your erections hard enough for penetration (entering your partner)? Sometimes (about half the time)     During sexual intercourse,  how often were you able to maintain your erection after you had penetrated (entered) your partner? Most Times (much more than half the time)     During sexual intercourse, how difficult was it to maintain your erection to completion of intercourse? Difficult     When you attempted sexual intercourse, how often was it satisfactory for you? A Few Times (much less than half the time)       SHIM Total Score   SHIM 13         PMH: Past Medical History:  Diagnosis Date  . Back pain   . HLD (hyperlipidemia) 04/30/2015  . Hypertension   . Osteoarthrosis involving multiple sites   . Ruptured lumbar disc    L4 & L5    Surgical History: Past Surgical History:  Procedure Laterality Date  . APPENDECTOMY    . BACK SURGERY  09/2013   L4-L5   . BASAL CELL CARCINOMA EXCISION  2012   right neck   . PROSTATE BIOPSY    . REPLACEMENT TOTAL  KNEE  2002   left knee   . REPLACEMENT TOTAL KNEE     right x 2    Home Medications:  Allergies as of 05/22/2017      Reactions   Amlodipine Other (See Comments)   LE EDEMA   Lisinopril    Cough-onset      Medication List       Accurate as of 05/22/17  1:29 PM. Always use your most recent med list.          albuterol 108 (90 Base) MCG/ACT inhaler Commonly known as:  PROVENTIL HFA;VENTOLIN HFA Inhale 2 puffs into the lungs every 6 (six) hours as needed for wheezing or shortness of breath.   cyanocobalamin 1000 MCG/ML injection Commonly known as:  (VITAMIN B-12) Inject into the muscle.   diclofenac sodium 1 % Gel Commonly known as:  VOLTAREN Apply topically.   losartan-hydrochlorothiazide 100-25 MG tablet Commonly known as:  HYZAAR Take 1 tablet by mouth daily.   omeprazole 20 MG capsule Commonly known as:  PRILOSEC Take 20 mg by mouth daily.   potassium chloride 10 MEQ tablet Commonly known as:  K-DUR   verapamil 240 MG (CO) 24 hr tablet Commonly known as:  COVERA HS Take 240 mg by mouth 2 (two) times daily.       Allergies:  Allergies  Allergen Reactions  . Amlodipine Other (See Comments)    LE EDEMA  . Lisinopril     Cough-onset     Family History: Family History  Problem Relation Age of Onset  . Heart failure Mother   . Heart attack Maternal Grandfather   . Bladder Cancer Neg Hx   . Prostate cancer Neg Hx   . Kidney cancer Neg Hx     Social History:  reports that he quit smoking about 35 years ago. His smoking use included Cigarettes. He smoked 1.00 pack per day. He has never used smokeless tobacco. He reports that he drinks about 0.5 oz of alcohol per week . He reports that he does not use drugs.  ROS: 12 point review systems negative other Urinary symptoms as above, leg swelling, back and joint pain.  Physical Exam: BP 121/74 (BP Location: Left Arm, Patient Position: Sitting, Cuff Size: Normal)   Pulse 83   Ht 5\' 10"  (1.778 m)   Wt  218 lb (98.9 kg)   BMI 31.28 kg/m   Constitutional:  Alert and oriented, No acute distress HEENT:  [place] AT, moist mucus membranes.  Trachea midline, no masses. Cardiovascular: No clubbing, cyanosis, or edema. Respiratory: Normal respiratory effort, no increased work of breathing. Abdomen: Right lower abdominal open appendectomy incision. Lower abdominal midline incision. Left inguinal incision not appreciated. Skin: No rashes, bruises or suspicious lesions. Neurologic: Grossly intact, no focal deficits, moving all 4 extremities. Psychiatric: Normal mood and affect.  Laboratory Data:  Component     Latest Ref Rng & Units 04/30/2015 11/18/2015 04/11/2016 10/06/2016  Prostate Specific Ag, Serum     0.0 - 4.0 ng/mL 6.9 (H) 8.0 (H) 8.3 (H) 8.2 (H)   Component     Latest Ref Rng & Units 04/16/2017  Prostate Specific Ag, Serum     0.0 - 4.0 ng/mL 9.6 (H)    Urinalysis n/a  Assessment & Plan:  66 year-old male with history of elevated PSA status post negative biopsy 2 s/p endorectal MRI And no fusion biopsy revealing intermediate risk Gleason 3+4 involving 4 of 18 cores.    1. Prostate cancer Newly dx cT1c intermediate risk prostate cancer involving 4/18 cores up to 50% on the left mid anterior and left mid lateral anterior.  The patient was counseled about the natural history of prostate cancer and the standard treatment options that are available for prostate cancer. It was explained to him how his age and life expectancy, clinical stage, Gleason score, and PSA affect his prognosis, the decision to proceed with additional staging studies, as well as how that information influences recommended treatment strategies. We discussed the roles for active surveillance, radiation therapy, surgical therapy, androgen deprivation, as well as ablative therapy options for the treatment of prostate cancer as appropriate to his individual cancer situation. We discussed the risks and benefits of these options with  regard to their impact on cancer control and also in terms of potential adverse events, complications, and impact on quality of life particularly related to urinary, bowel, and sexual function. The patient was encouraged to ask questions throughout the discussion today and all questions were answered to his stated satisfaction. In addition, the patient was providedwith and/or directed to appropriate resources and literature for further education about prostate cancer treatment options.  We discussed surgical therapy for prostate cancer including the different available surgical approaches. We discussed, in detail, the risks and expectations of surgery with regard to cancer control, urinary control, and erectile dysfunction as well as expected post operative cover he processed. Additional risks of surgery including but not limalited to bleeding, infection, hernia formation, nerve damage, steel formation, bowel/rect injury, potentially necessitating colostomy, damage to the urinary tract resulting in urinary leakage, urethral stricture, and cardiopulmonary risk such as myocardial infarction, stroke, death, thromboembolism etc. were explained. The risk of open surgical conversion for robotics/laparoscopic prostatectomy is also discussed.  He was offered referral to radiation oncology but declined. He would like to proceed with robotic radical prostatectomy with bilateral pelvic lymph node dissection. We will stage him with CT abdomen and pelvis given his PSA close to 10.    Referral to PT preop.    2. Urinary frequency Not interested in pharmacotherapy.  3. Other male erectile dysfunction No intervention desired at this time, we will continue to follow. Given his severe ED, will likely not perform nerve sparing at the time of his procedure.   Hollice Espy, MD  [facility] [address] [place], [place] [address] [phone]   I spent 40 min with this  patient of which greater than 50%  was spent in counseling and coordination of care with the patient.  Case was reviewed with Dr. Jeffie Pollock as well as radiation tech from [facility].  Imaging from fusion biopsy was also reviewed today.

## 2017-05-22 NOTE — Progress Notes (Signed)
Can you see if this patient is free to come in today at 1 pm to discuss pathology?  Hollice Espy, MD

## 2017-05-23 ENCOUNTER — Other Ambulatory Visit: Payer: Self-pay | Admitting: Radiology

## 2017-05-23 ENCOUNTER — Telehealth: Payer: Self-pay | Admitting: Radiology

## 2017-05-23 DIAGNOSIS — C61 Malignant neoplasm of prostate: Secondary | ICD-10-CM

## 2017-05-23 NOTE — Telephone Encounter (Signed)
Pt scheduled for robotic radical prostatectomy with bilateral lymph node dissection with Dr Erlene Quan on 07/09/17. Made pt aware of surgery date, pre-admit testing appt, to call Friday prior to surgery for arrival time to SDS, and post op appts. Questions answered. Pt voices understanding.

## 2017-05-24 ENCOUNTER — Other Ambulatory Visit: Payer: Self-pay | Admitting: Radiology

## 2017-05-25 ENCOUNTER — Telehealth: Payer: Self-pay | Admitting: Urology

## 2017-05-25 NOTE — Telephone Encounter (Signed)
LMOM of pt daughter, Jerry Welch, cell phone.

## 2017-05-25 NOTE — Telephone Encounter (Signed)
Pt daughter called office asking what kind of CT has been ordered for her dad also pt is requesting to have CT done at his daughters office at [facility] clinic, Daughter wants orders changed to this location. Please advise. [phone] ext [phone] daughter is on pt DPR.

## 2017-05-25 NOTE — Telephone Encounter (Signed)
Spoke with pt daughter in reference to CT and reasons for waiting 6 weeks before surgery. Jerry Welch voiced understanding of whole conversation. Jerry Welch stated she would talk with pt this weekend and call back next week in reference to CT.

## 2017-05-25 NOTE — Telephone Encounter (Signed)
I prefer this can be done at [facility] since that I'm able to visualize the images myself. The obsolete have to have it done at [facility], please ensure that they bring the disc to the office.  The reason for waiting 6 weeks after the biopsy is to ensure good surgical planes following biopsy. This is a reasonable amount of time to wait following biopsy.  Additionally, I would allow some time for him to be evaluated by physical therapy and start PT. Losing a little belly fat prior to the procedure is a bonus but not the most concerning issue.  Hollice Espy, MD

## 2017-05-25 NOTE — Telephone Encounter (Signed)
Spoke with pt daughter, Jinny Blossom, in reference to having CT performed at [facility] with Jinny Blossom should Dr. Erlene Quan have questions in reference to CT results would not be able to speak with radiologist. Jinny Blossom stated if pt were to have CT performed at her office there would be no cost to pt. Jinny Blossom also stated that pt is under the impression that he needs to loose belly fat in order to have a better success of prostatectomy. With that understanding Jinny Blossom and her family are concerned the cancer may spread and become worse before surgery in October. Reinforced with Jinny Blossom maybe better if her and the family came with pt to a consultation appt with pt. Megan voiced understanding and requested a note be sent to Dr. Erlene Quan first. Please advise.

## 2017-05-29 ENCOUNTER — Telehealth: Payer: Self-pay | Admitting: Urology

## 2017-05-29 NOTE — Telephone Encounter (Signed)
Per patient, he would like to have the CT scan performed at [facility] due to the cost of the exam.  They stated that they will send the report and the disk with the images to the office for our review.    In order for them to be able to perform the CT scan, they will need the order and the pre-cert faxed to [facility] at [phone], Attn: Jinny Blossom.  She can be reached at [phone], Ext [phone].  Also, patient is requesting that we cancel his CT at [facility] on 9/12 if we are okay with him having the CT at [facility].

## 2017-05-30 ENCOUNTER — Telehealth: Payer: Self-pay | Admitting: Urology

## 2017-05-30 ENCOUNTER — Encounter: Payer: Self-pay | Admitting: Physical Therapy

## 2017-05-30 ENCOUNTER — Ambulatory Visit: Payer: BLUE CROSS/BLUE SHIELD | Attending: Urology | Admitting: Physical Therapy

## 2017-05-30 DIAGNOSIS — M6208 Separation of muscle (nontraumatic), other site: Secondary | ICD-10-CM

## 2017-05-30 DIAGNOSIS — R2689 Other abnormalities of gait and mobility: Secondary | ICD-10-CM | POA: Diagnosis present

## 2017-05-30 DIAGNOSIS — R29898 Other symptoms and signs involving the musculoskeletal system: Secondary | ICD-10-CM

## 2017-05-30 NOTE — Telephone Encounter (Signed)
Pt's daughter, Jinny Blossom called and would like to know results of his CT scan.  Please give her a call at [phone] ext [phone]

## 2017-05-30 NOTE — Patient Instructions (Addendum)
  Proper body mechanics with getting out of a chair to decrease strain  on back &pelvic floor   Avoid holding your breath when Getting out of the chair:  Scoot to front part of chair chair Heels behind feet, feet are hip width apart, nose over toes  Inhale like you are smelling roses Exhale to stand    _______   Avoid straining pelvic floor, abdominal muscles , spine  Use log rolling technique instead of getting out of bed with your neck or the sit-up   Log rolling out of .bed  L  arm overhead  Raise hips and scoot hips to R   Drop knees to L,  scooting L shoulder back to get completely on your L side so your shoulders, hips, and knees point to the L    Then breathe as you drop feet off bed and prop onto L elbow and  use both hands to push yourself   __________________________  Sitting posture:  Feet under ankles    _________________________  Open book : To minimize mid back tensions   15 reps each side

## 2017-05-30 NOTE — Telephone Encounter (Signed)
Orders faxed. Will cancel [facility] CT appt.

## 2017-05-30 NOTE — Telephone Encounter (Signed)
Spoke with Jinny Blossom who stated she is wanting to follow up on CT orders for her dad, not receive the results. Orders were faxed.

## 2017-05-31 NOTE — Therapy (Signed)
[place] MAIN [facility] SERVICES [address] [place], [place], [address] Phone: [phone]   Fax:  [phone]  Physical Therapy Evaluation  Patient Details  Name: Jerry Welch MRN: [id number] Date of Birth: 06/26/1951 Referring Provider: Erlene Quan  Encounter Date: 05/30/2017      PT End of Session - 05/30/17 1542    Visit Number 1   Number of Visits 6   Date for PT Re-Evaluation 07/04/17   Authorization Type g -code    PT Start Time 8182   PT Stop Time 9937   PT Time Calculation (min) 61 min      Past Medical History:  Diagnosis Date  . Back pain   . HLD (hyperlipidemia) 04/30/2015  . Hypertension   . Osteoarthrosis involving multiple sites   . Ruptured lumbar disc    L4 & L5    Past Surgical History:  Procedure Laterality Date  . APPENDECTOMY    . BACK SURGERY  09/2013   L4-L5   . BASAL CELL CARCINOMA EXCISION  2012   right neck   . PROSTATE BIOPSY    . 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 REPLACEMENT TOTAL KNEE     right x 2    There were no vitals filed for this visit.       Subjective Assessment - 05/30/17 1316    Subjective Pt reports his prostate surgery is scheduled 10/ 15/18. Pt does occasionally experience leakage with coughing. Pt's job requires standing on concrete, occasional lifting < 30 lbs, bending, climbing ladders. Pt reports lower back pain ( R >L) that is a constant nagging pain 4/10.  No increased pain with activities.  Denied radiating pain. Pt had low back surgery in 2016 at L4-5 but did not undergo PT.  Pt uses an inverter at night to straighten up and relieve the pain. Alleve also relieved the LBP.  Other pelvic floor function: bowel movements occur daily with occasional straining.    Pertinent History Hx of B knee surgery, appendectomy, inguinal hernia and repair at age of 57, a recnt fall onto tailbone without injuries.             [facility] PT Assessment - 05/31/17 1114      Assessment   Medical Diagnosis prostate cancer   Referring Provider Brandon     Precautions   Precautions None     Restrictions   Weight Bearing Restrictions No     Balance Screen   Has the patient fallen in the past 6 months No     Observation/Other Assessments   Observations severe forward head, ankles crossed      Coordination   Gross Motor Movements are Fluid and Coordinated --  diaphramgatic excursion but minimal   Fine Motor Movements are Fluid and Coordinated --  pelvic floor lengthening, but limited contraction     Posture/Postural Control   Posture Comments acromium to auditory mateus: L 17 cm, R 15 cm      AROM   Overall AROM  --  R rotation ~20 %, L 25%.  Cervical ext 30deg,      Palpation   Spinal mobility hypomobility at thoracic spine C7-L2    Palpation comment restrictions suprapubic scar ( appendectomy      Bed Mobility   Bed Mobility --  crunch sit up method             Objective measurements completed on examination: See above findings.  Pelvic Floor Special Questions - 05/31/17 1114    Diastasis Recti 4 fingers with below sternum, above umbilicus,           [facility] Adult PT Treatment/Exercise - 05/31/17 1112      Therapeutic Activites    Therapeutic Activities --  see pt instructions                 PT Education - 05/30/17 1541    Education provided Yes   Education Details POC,anatomy/ physiology, goals, HEP   Person(s) Educated Patient   Methods Explanation;Tactile cues;Demonstration;Verbal cues;Handout   Comprehension Returned demonstration;Verbalized understanding             PT Long Term Goals - 05/30/17 1354      PT LONG TERM GOAL #1   Title Pt will dem o less forward head and demo auditory mateus aligned at shoulder in order to optimize intraabdominal pressure system to minimzie leakage and LBP   Baseline acromium to auditory mateus: L 17 cm, R 15 cm    Time 12   Period Weeks   Status New    Target Date 08/22/17     PT LONG TERM GOAL #2   Title Pt will decrease his ODI score from 14% to < 7% in order to improve ADLs   Time 12   Period Weeks   Status New   Target Date 08/22/17     PT LONG TERM GOAL #3   Title Pt will demo less separation of his abdominal mm ( 4 fingers width to < 2 fingers) in order to resturn motility and postural stability to standing on concrete at work     Time 12   Period Weeks   Status New     PT LONG TERM GOAL #4   Title Pt will demo Grade 3/3/3/3 in order to progress with pelvic floor strengthening to minimize urinary leakage   Time 12   Period Weeks   Status New                Plan - 05/30/17 1328    Clinical Impression Statement  Pt is a 66 yo male who is scheduled for  prostate surgery. He reports urinary incontinence with coughing (SUI)  and CLBP.  Pt's clinical presentations include diastasis recti, poor deep core  strength, limited pelvic floor ROM, increased spinal hypomobility and  thoracic mm tensions, limited diaphragmatic excursion, and severe thoracic  kyphosis/ forward head, poor body mechanics which place strain on his back  and pelvic floor mm.  These deficits indicate a less efficient intraabdominal pressure which impact SUI and CLBP.  Pt is at greater risk for urinary incontinence post surgery. Pt would benefit from skilled PT to address these deficits prior to surgery for improved outcomes. Following Tx today, pt decreased less thoracic mm  tensions and demo'd proper body mechanics.     Clinical Presentation Evolving   Clinical Decision Making Moderate   Rehab Potential Good   PT Frequency 1x / week   PT Duration 6 weeks   PT Treatment/Interventions Neuromuscular re-education;Manual techniques;Functional mobility training;Therapeutic activities;Moist Heat;Therapeutic exercise;Scar mobilization;Manual lymph drainage;Taping;Splinting;Energy conservation;Wheelchair mobility training;Stair training;Balance training;Gait  training   Consulted and Agree with Plan of Care Patient      Patient will benefit from skilled therapeutic intervention in order to improve the following deficits and impairments:  Pain, Postural dysfunction, Increased muscle spasms, Hypermobility, Decreased mobility, Decreased coordination, Improper body mechanics, Decreased safety awareness, Decreased balance, Decreased endurance, Decreased activity tolerance,  Decreased range of motion, Decreased strength, Hypomobility, Difficulty walking, Obesity  Visit Diagnosis: Other symptoms and signs involving the musculoskeletal system  Diastasis recti  Other abnormalities of gait and mobility      G-Codes - 06/08/17 1541    Functional Assessment Tool Used (Outpatient Only) ODI , clinical judgement    Functional Limitation Mobility: Walking and moving around   Mobility: Walking and Moving Around Current Status [phone]) At least 20 percent but less than 40 percent impaired, limited or restricted   Mobility: Walking and Moving Around Goal Status [phone]) At least 1 percent but less than 20 percent impaired, limited or restricted       Problem List Patient Active Problem List   Diagnosis Date Noted  . HLD (hyperlipidemia) 04/30/2015  . BP (high blood pressure) 04/30/2015  . Arthritis, degenerative 04/30/2015  . Elevated glucose 12/02/2014  . Elevated serum creatinine 12/02/2014  . Leg swelling 12/02/2014  . Obesity 12/02/2014  . Preop cardiovascular exam 10/06/2013  . 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 Abnormal prostate specific antigen 07/23/2013  . Benign prostatic hyperplasia with urinary obstruction 07/23/2013  . Excessive urination at night 07/23/2013  . Decreased urine stream 07/23/2013  . FOM (frequency of micturition) 07/23/2013  . Elevated prostate specific antigen (PSA) 07/23/2013  . Slowing of urinary stream 07/23/2013  . HTN (hypertension) 07/26/2011  . Somnolence 07/26/2011  . SOB (shortness of  breath) 07/26/2011    Jerl Mina ,PT, DPT, E-RYT  05/31/2017, 11:19 AM  [place] MAIN [facility] SERVICES [address] [place], [place], [address] Phone: [phone]   Fax:  [phone]  Name: DEMITRUS FRANCISCO MRN: [id number] Date of Birth: 26-Aug-1951

## 2017-06-01 ENCOUNTER — Other Ambulatory Visit: Payer: Self-pay

## 2017-06-05 ENCOUNTER — Ambulatory Visit: Payer: BLUE CROSS/BLUE SHIELD | Admitting: Physical Therapy

## 2017-06-05 DIAGNOSIS — R2689 Other abnormalities of gait and mobility: Secondary | ICD-10-CM

## 2017-06-05 DIAGNOSIS — R29898 Other symptoms and signs involving the musculoskeletal system: Secondary | ICD-10-CM

## 2017-06-05 DIAGNOSIS — M6208 Separation of muscle (nontraumatic), other site: Secondary | ICD-10-CM

## 2017-06-05 NOTE — Patient Instructions (Signed)
Deep core ( handout) level 1 -2    Child pose rocking stretching

## 2017-06-05 NOTE — Therapy (Signed)
[place] MAIN [facility] SERVICES [address] [place], [place], [address] Phone: [phone]   Fax:  [phone]  Physical Therapy Treatment  Patient Details  Name: Jerry Welch MRN: [id number] Date of Birth: 1951/04/04 Referring Provider: Erlene Quan  Encounter Date: 06/05/2017      PT End of Session - 06/05/17 1513    Visit Number 2   Number of Visits 6   Date for PT Re-Evaluation 07/04/17   Authorization Type g -code    PT Start Time 1430   PT Stop Time 1525   PT Time Calculation (min) 55 min   Activity Tolerance Patient tolerated treatment well;No increased pain   Behavior During Therapy WFL for tasks assessed/performed      Past Medical History:  Diagnosis Date  . Back pain   . HLD (hyperlipidemia) 04/30/2015  . Hypertension   . Osteoarthrosis involving multiple sites   . Ruptured lumbar disc    L4 & L5    Past Surgical History:  Procedure Laterality Date  . APPENDECTOMY    . BACK SURGERY  09/2013   L4-L5   . BASAL CELL CARCINOMA EXCISION  2012   right neck   . PROSTATE BIOPSY    . 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 REPLACEMENT TOTAL KNEE     right x 2    There were no vitals filed for this visit.      Subjective Assessment - 06/05/17 1436    Subjective Pt reports his back feels better with the new stretches.             [facility] PT Assessment - 06/05/17 1506      Coordination   Gross Motor Movements are Fluid and Coordinated --  1 cm, lateral diaphramgatic excursion, postTx: 2 cm    Fine Motor Movements are Fluid and Coordinated --  TrA and pelvic floor co activation with exhalation correct      Palpation   Spinal mobility moderately decreased thoracic mm tensions.                   Pelvic Floor Special Questions - 06/05/17 1506    Diastasis Recti 3 fingers width preTx: 3 fingers width with less depth Post Tx           [facility] Adult PT Treatment/Exercise - 06/05/17 1510      Neuro Re-ed    Neuro Re-ed Details  see pt instructions     Manual Therapy   Manual therapy comments fascial releases over ribcage, intercostal mm B , posterior ribcage, to faciliate lateral and depression of ribcage. quadriped:UE ext/ trunk rotation: oblique/ rectus fascial releases                 PT Education - 06/05/17 1512    Education provided Yes   Education Details HEP   Person(s) Educated Patient   Methods Explanation;Demonstration;Tactile cues;Verbal cues;Handout   Comprehension Returned demonstration;Verbalized understanding             PT Long Term Goals - 05/30/17 1354      PT LONG TERM GOAL #1   Title Pt will dem o less forward head and demo auditory mateus aligned at shoulder in order to optimize intraabdominal pressure system to minimzie leakage and LBP   Baseline acromium to auditory mateus: L 17 cm, R 15 cm    Time 12   Period Weeks   Status New   Target Date 08/22/17  PT LONG TERM GOAL #2   Title Pt will decrease his ODI score from 14% to < 7% in order to improve ADLs   Time 12   Period Weeks   Status New   Target Date 08/22/17     PT LONG TERM GOAL #3   Title Pt will demo less separation of his abdominal mm ( 4 fingers width to < 2 fingers) in order to resturn motility and postural stability to standing on concrete at work     Time 12   Period Weeks   Status New     PT LONG TERM GOAL #4   Title Pt will demo Grade 3/3/3/3 in order to progress with pelvic floor strengthening to minimize urinary leakage   Time 12   Period Weeks   Status New               Plan - 06/05/17 1513    Clinical Impression Statement Pt demo'd increased lateral and posterior excursion of ribcage and improved depression of ribcage in addition to less abdominal separation of rectus abdominal mm post manual Tx. Pt was ready to advance to deep core strengthening to minimize diastasis recti and optimize intraabdominal pressure for improved spinal and pelvic  floor function. Pt continues to benefit from skilled PT    Rehab Potential Good   PT Frequency 1x / week   PT Duration 6 weeks   PT Treatment/Interventions Neuromuscular re-education;Manual techniques;Functional mobility training;Therapeutic activities;Moist Heat;Therapeutic exercise;Scar mobilization;Manual lymph drainage;Taping;Splinting;Energy conservation;Wheelchair mobility training;Stair training;Balance training;Gait training   Consulted and Agree with Plan of Care Patient      Patient will benefit from skilled therapeutic intervention in order to improve the following deficits and impairments:  Pain, Postural dysfunction, Increased muscle spasms, Hypermobility, Decreased mobility, Decreased coordination, Improper body mechanics, Decreased safety awareness, Decreased balance, Decreased endurance, Decreased activity tolerance, Decreased range of motion, Decreased strength, Hypomobility, Difficulty walking, Obesity  Visit Diagnosis: Other symptoms and signs involving the musculoskeletal system  Diastasis recti  Other abnormalities of gait and mobility     Problem List Patient Active Problem List   Diagnosis Date Noted  . HLD (hyperlipidemia) 04/30/2015  . BP (high blood pressure) 04/30/2015  . Arthritis, degenerative 04/30/2015  . Elevated glucose 12/02/2014  . Elevated serum creatinine 12/02/2014  . Leg swelling 12/02/2014  . Obesity 12/02/2014  . Preop cardiovascular exam 10/06/2013  . 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 Abnormal prostate specific antigen 07/23/2013  . Benign prostatic hyperplasia with urinary obstruction 07/23/2013  . Excessive urination at night 07/23/2013  . Decreased urine stream 07/23/2013  . FOM (frequency of micturition) 07/23/2013  . Elevated prostate specific antigen (PSA) 07/23/2013  . Slowing of urinary stream 07/23/2013  . HTN (hypertension) 07/26/2011  . Somnolence 07/26/2011  . SOB (shortness of breath)  07/26/2011    Jerl Mina ,PT, DPT, E-RYT  06/05/2017, 3:18 PM  [place] MAIN [facility] SERVICES [address] [place], [place], [address] Phone: [phone]   Fax:  [phone]  Name: Jerry Welch MRN: [id number] Date of Birth: November 30, 1950

## 2017-06-06 ENCOUNTER — Ambulatory Visit: Payer: BLUE CROSS/BLUE SHIELD

## 2017-06-14 ENCOUNTER — Encounter: Payer: Self-pay | Admitting: Urology

## 2017-06-14 ENCOUNTER — Ambulatory Visit (INDEPENDENT_AMBULATORY_CARE_PROVIDER_SITE_OTHER): Payer: BLUE CROSS/BLUE SHIELD | Admitting: Urology

## 2017-06-14 VITALS — BP 154/95 | HR 73 | Ht 70.0 in | Wt 217.0 lb

## 2017-06-14 DIAGNOSIS — R972 Elevated prostate specific antigen [PSA]: Secondary | ICD-10-CM | POA: Diagnosis not present

## 2017-06-14 DIAGNOSIS — C61 Malignant neoplasm of prostate: Secondary | ICD-10-CM

## 2017-06-14 DIAGNOSIS — N528 Other male erectile dysfunction: Secondary | ICD-10-CM | POA: Diagnosis not present

## 2017-06-18 ENCOUNTER — Ambulatory Visit: Payer: BLUE CROSS/BLUE SHIELD | Admitting: Physical Therapy

## 2017-06-18 DIAGNOSIS — R29898 Other symptoms and signs involving the musculoskeletal system: Secondary | ICD-10-CM

## 2017-06-18 DIAGNOSIS — M6208 Separation of muscle (nontraumatic), other site: Secondary | ICD-10-CM

## 2017-06-18 DIAGNOSIS — R2689 Other abnormalities of gait and mobility: Secondary | ICD-10-CM

## 2017-06-18 NOTE — Patient Instructions (Signed)
band under heels while laying on back w/ knees bent  "W" exercise  10 reps x 2 sets   Band is placed under feet, knees bent, feet are hip width apart Hold band with thumbs point out, keep upper arm and elbow touching the bed the whole time  - inhale and then exhale pull bands by bending elbows hands move in a "w"  (feel shoulder blades squeezing)    __________ Instead of level 2, do  Gentle pressure along scar with 3 min of the 6 min of deepc ore level 4 ( heel slides)  Slide heel out and in  on exhale

## 2017-06-18 NOTE — Therapy (Signed)
[place] MA[place] [facility] SERVICES [address] , [place], [address] Phone: [phone]   Fax:  [phone]  Physical Therapy Treatment  Patient Details  Name: Jerry Welch MRN: [id number] Date of Birth: 03-24-1951 Referring Provider: Erlene Quan  Encounter Date: 06/18/2017      PT End of Session - 06/18/17 1638    Visit Number 3   Number of Visits 6   Date for PT Re-Evaluation 07/04/17   Authorization Type g -code    PT Start Time 5329   PT Stop Time 1630   PT Time Calculation (min) 45 min   Activity Tolerance Patient tolerated treatment well;No increased pain   Behavior During Therapy WFL for tasks assessed/performed      Past Medical History:  Diagnosis Date  . Back pain   . HLD (hyperlipidemia) 04/30/2015  . Hypertension   . Osteoarthrosis involving multiple sites   . Ruptured lumbar disc    L4 & L5    Past Surgical History:  Procedure Laterality Date  . APPENDECTOMY    . BACK SURGERY  09/2013   L4-L5   . BASAL CELL CARCINOMA EXCISION  2012   right neck   . PROSTATE BIOPSY    . 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 REPLACEMENT TOTAL KNEE     right x 2    There were no vitals filed for this visit.      Subjective Assessment - 06/18/17 1634    Subjective Pt feels his LBP is better by 60%. Pt contiues to perform his pelvic floor exercises and deep core HEP            [facility] PT Assessment - 06/18/17 1636      Palpation   Palpation comment severe abdominal scar below umbilicus, and R LQ. Post Tx: decreased                   Pelvic Floor Special Questions - 06/18/17 1635    Diastasis Recti less cavitation, 3 fingers with pre head lift, increased diaphragmatic excursion and depression,            [facility] Adult PT Treatment/Exercise - 06/18/17 1637      Neuro Re-ed    Neuro Re-ed Details  see pt instructions     Manual Therapy   Manual therapy comments gentle fascial /scar release  over abdominal scar , added MWM deep core level 4 ( hip flex/ext )                 PT Education - 06/18/17 1638    Education provided Yes   Education Details HEP   Person(s) Educated Patient   Methods Explanation;Demonstration;Tactile cues;Verbal cues;Handout   Comprehension Returned demonstration;Verbalized understanding             PT Long Term Goals - 05/30/17 1354      PT LONG TERM GOAL #1   Title Pt will dem o less forward head and demo auditory mateus aligned at shoulder in order to optimize intraabdominal pressure system to minimzie leakage and LBP   Baseline acromium to auditory mateus: L 17 cm, R 15 cm    Time 12   Period Weeks   Status New   Target Date 08/22/17     PT LONG TERM GOAL #2   Title Pt will decrease his ODI score from 14% to < 7% in order to improve ADLs   Time 12   Period  Weeks   Status New   Target Date 08/22/17     PT LONG TERM GOAL #3   Title Pt will demo less separation of his abdominal mm ( 4 fingers width to < 2 fingers) in order to resturn motility and postural stability to standing on concrete at work     Time 12   Period Weeks   Status New     PT LONG TERM GOAL #4   Title Pt will demo Grade 3/3/3/3 in order to progress with pelvic floor strengthening to minimize urinary leakage   Time 12   Period Weeks   Status New               Plan - 06/18/17 1639    Clinical Impression Statement Pt showed less forward head posture, abdominal separation, and increased diaphragmatic excursion compared to last session. Addressed abdominal scar adhesions today w/ gentle/facsial/ movement with mobilization techniques which elicited increased mobility without redness to the area. Pt reported feeling the area to be looser and sore following Tx.  Advanced pt to thoracolumbar strengthening to further improve forward head posture. Pt continues to benefit from skilled PT      Rehab Potential Good   PT Frequency 1x / week   PT Duration 6 weeks    PT Treatment/Interventions Neuromuscular re-education;Manual techniques;Functional mobility training;Therapeutic activities;Moist Heat;Therapeutic exercise;Scar mobilization;Manual lymph drainage;Taping;Splinting;Energy conservation;Wheelchair mobility training;Stair training;Balance training;Gait training   Consulted and Agree with Plan of Care Patient      Patient will benefit from skilled therapeutic intervention in order to improve the following deficits and impairments:  Pain, Postural dysfunction, Increased muscle spasms, Hypermobility, Decreased mobility, Decreased coordination, Improper body mechanics, Decreased safety awareness, Decreased balance, Decreased endurance, Decreased activity tolerance, Decreased range of motion, Decreased strength, Hypomobility, Difficulty walking, Obesity  Visit Diagnosis: Other symptoms and signs involving the musculoskeletal system  Diastasis recti  Other abnormalities of gait and mobility     Problem List Patient Active Problem List   Diagnosis Date Noted  . HLD (hyperlipidemia) 04/30/2015  . BP (high blood pressure) 04/30/2015  . Arthritis, degenerative 04/30/2015  . Elevated glucose 12/02/2014  . Elevated serum creatinine 12/02/2014  . Leg swelling 12/02/2014  . Obesity 12/02/2014  . Preop cardiovascular exam 10/06/2013  . 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 Abnormal prostate specific antigen 07/23/2013  . Benign prostatic hyperplasia with urinary obstruction 07/23/2013  . Excessive urination at night 07/23/2013  . Decreased urine stream 07/23/2013  . FOM (frequency of micturition) 07/23/2013  . Elevated prostate specific antigen (PSA) 07/23/2013  . Slowing of urinary stream 07/23/2013  . HTN (hypertension) 07/26/2011  . Somnolence 07/26/2011  . SOB (shortness of breath) 07/26/2011    Jerry Welch ,PT, DPT, E-RYT  06/18/2017, 4:59 PM  [place] MAIN [facility]  SERVICES [address] [place], [place], [address] Phone: [phone]   Fax:  [phone]  Name: Jerry Welch MRN: [id number] Date of Birth: Dec 28, 1950

## 2017-06-25 ENCOUNTER — Encounter
Admission: RE | Admit: 2017-06-25 | Discharge: 2017-06-25 | Disposition: A | Discharge status: Home / Self Care | Payer: BLUE CROSS/BLUE SHIELD | Source: Ambulatory Visit | Attending: Urology | Admitting: Urology

## 2017-06-25 DIAGNOSIS — M7989 Other specified soft tissue disorders: Secondary | ICD-10-CM | POA: Diagnosis not present

## 2017-06-25 DIAGNOSIS — E669 Obesity, unspecified: Secondary | ICD-10-CM | POA: Diagnosis not present

## 2017-06-25 DIAGNOSIS — R39198 Other difficulties with micturition: Secondary | ICD-10-CM | POA: Insufficient documentation

## 2017-06-25 DIAGNOSIS — E785 Hyperlipidemia, unspecified: Secondary | ICD-10-CM | POA: Insufficient documentation

## 2017-06-25 DIAGNOSIS — C61 Malignant neoplasm of prostate: Secondary | ICD-10-CM | POA: Diagnosis not present

## 2017-06-25 DIAGNOSIS — Z01810 Encounter for preprocedural cardiovascular examination: Secondary | ICD-10-CM | POA: Insufficient documentation

## 2017-06-25 DIAGNOSIS — R7309 Other abnormal glucose: Secondary | ICD-10-CM | POA: Insufficient documentation

## 2017-06-25 DIAGNOSIS — R001 Bradycardia, unspecified: Secondary | ICD-10-CM | POA: Insufficient documentation

## 2017-06-25 DIAGNOSIS — Z01812 Encounter for preprocedural laboratory examination: Secondary | ICD-10-CM | POA: Insufficient documentation

## 2017-06-25 DIAGNOSIS — I10 Essential (primary) hypertension: Secondary | ICD-10-CM | POA: Diagnosis not present

## 2017-06-25 HISTORY — DX: Malignant (primary) neoplasm, unspecified: C80.1

## 2017-06-25 HISTORY — DX: Sleep apnea, unspecified: G47.30

## 2017-06-25 HISTORY — DX: Personal history of other diseases of the digestive system: Z87.19

## 2017-06-25 HISTORY — DX: Gastro-esophageal reflux disease without esophagitis: K21.9

## 2017-06-25 LAB — TYPE AND SCREEN
ABO/RH(D): A POS
Antibody Screen: NEGATIVE

## 2017-06-25 LAB — CBC
HEMATOCRIT: 42.8 % (ref 40.0–52.0)
Hemoglobin: 15.1 g/dL (ref 13.0–18.0)
MCH: 29.4 pg (ref 26.0–34.0)
MCHC: 35.2 g/dL (ref 32.0–36.0)
MCV: 83.4 fL (ref 80.0–100.0)
PLATELETS: 157 10*3/uL (ref 150–440)
RBC: 5.13 MIL/uL (ref 4.40–5.90)
RDW: 13.3 % (ref 11.5–14.5)
WBC: 4.9 10*3/uL (ref 3.8–10.6)

## 2017-06-25 LAB — URINALYSIS, ROUTINE W REFLEX MICROSCOPIC
BILIRUBIN URINE: NEGATIVE
Glucose, UA: 50 mg/dL — AB
HGB URINE DIPSTICK: NEGATIVE
Ketones, ur: NEGATIVE mg/dL
Leukocytes, UA: NEGATIVE
NITRITE: NEGATIVE
PROTEIN: NEGATIVE mg/dL
SPECIFIC GRAVITY, URINE: 1.016 (ref 1.005–1.030)
pH: 7 (ref 5.0–8.0)

## 2017-06-25 LAB — BASIC METABOLIC PANEL
Anion gap: 9 (ref 5–15)
BUN: 16 mg/dL (ref 6–20)
CHLORIDE: 102 mmol/L (ref 101–111)
CO2: 28 mmol/L (ref 22–32)
CREATININE: 1.16 mg/dL (ref 0.61–1.24)
Calcium: 9 mg/dL (ref 8.9–10.3)
GFR calc Af Amer: >60 mL/min (ref >60)
GFR calc non Af Amer: >60 mL/min (ref >60)
GLUCOSE: 126 mg/dL — AB (ref 65–99)
POTASSIUM: 3.2 mmol/L — AB (ref 3.5–5.1)
Sodium: 139 mmol/L (ref 135–145)

## 2017-06-25 LAB — PROTIME-INR
INR: 0.97
Prothrombin Time: 12.8 seconds (ref 11.4–15.2)

## 2017-06-25 NOTE — Patient Instructions (Signed)
Your procedure is scheduled on: Monday 07/09/17 Report to [facility]. 2ND FLOOR MEDICAL MALL ENTRANCE. To find out your arrival time please call [phone] between 1PM - 3PM on Friday 07/06/17.  Remember: Instructions that are not followed completely may result in serious medical risk, up to and including death, or upon the discretion of your surgeon and anesthesiologist your surgery may need to be rescheduled.    __X__ 1. Do not eat anything after midnight the night before your    procedure.  No gum chewing or hard candies.  You may drink clear   liquids up to 2 hours before you are scheduled to arrive at the   hospital for your procedure. Do not drink clear liquids within 2   hours of scheduled arrival to the hospital as this may lead to your   procedure being delayed or rescheduled.       Clear liquids include:   Water or Apple juice without pulp   Clear carbohydrate beverage such as Clearfast or Gatorade   Black coffee or Clear Tea (no milk, no creamer, do not add anything   to the coffee or tea)    Diabetics should only drink water   __X__ 2. No Alcohol for 24 hours before or after surgery.   ____ 3. Bring all medications with you on the day of surgery if instructed.    __X__ 4. Notify your doctor if there is any change in your medical condition     (cold, fever, infections).             __X___5. No smoking within 24 hours of your surgery.     Do not wear jewelry, make-up, hairpins, clips or nail polish.  Do not wear lotions, powders, or perfumes.   Do not shave 48 hours prior to surgery. Men may shave face and neck.  Do not bring valuables to the hospital.    [facility] is not responsible for any belongings or valuables.               Contacts, dentures or bridgework may not be worn into surgery.  Leave your suitcase in the car. After surgery it may be brought to your room.  For patients admitted to the hospital, discharge time is determined by your                 treatment team.   Patients discharged the day of surgery will not be allowed to drive home.   Please read over the following fact sheets that you were given:   MRSA Information   __X__ Take these medicines the morning of surgery with A SIP OF WATER:    1. OMEPRAZOLE AT BEDTIME AND AGAIN THE MORNING OF SURGERY  2. VERAPAMIL  3.   4.  5.  6.  ____ Fleet Enema (as directed)   __X__ Use CHG Soap/SAGE wipes as directed  __X__ Use inhalers on the day of surgery  ____ Stop metformin 2 days prior to surgery    ____ Take 1/2 of usual insulin dose the night before surgery and none on the morning of surgery.   ____ Stop Coumadin/Plavix/aspirin on   __X__ Stop Anti-inflammatories such as Advil, Aleve, Ibuprofen, Motrin, Naproxen, Naprosyn, Goodies,powder, or aspirin products.  OK to take Tylenol. 07/02/17   __X__ Stop supplements, Vitamin E, Fish Oil until after surgery.    __X__ Bring C-Pap to the hospital.

## 2017-06-26 LAB — URINE CULTURE: CULTURE: NO GROWTH

## 2017-06-28 ENCOUNTER — Ambulatory Visit: Payer: BLUE CROSS/BLUE SHIELD | Attending: Urology | Admitting: Physical Therapy

## 2017-06-28 DIAGNOSIS — R29898 Other symptoms and signs involving the musculoskeletal system: Secondary | ICD-10-CM | POA: Diagnosis not present

## 2017-06-28 DIAGNOSIS — M6208 Separation of muscle (nontraumatic), other site: Secondary | ICD-10-CM | POA: Insufficient documentation

## 2017-06-28 DIAGNOSIS — R2689 Other abnormalities of gait and mobility: Secondary | ICD-10-CM | POA: Insufficient documentation

## 2017-06-28 NOTE — Patient Instructions (Addendum)
PELVIC FLOOR / KEGEL EXERCISES   Pelvic floor/ Kegel exercises are used to strengthen the muscles in the base of your pelvis that are responsible for supporting your pelvic organs and preventing urine/feces leakage. Based on your therapist's recommendations, they can be performed while standing, sitting, or lying down.  Make yourself aware of this muscle group by using these cues:  Imagine you are in a crowded room and you feel the need to pass gas. Your response is to pull up and in at the rectum.  Close the rectum. Pull the muscles up inside your body,feeling your scrotum lifting as well . Feel the pelvic floor muscles lift as if you were walking into a cold lake.  Place your hand on top of your pubic bone. Tighten and draw in the muscles around the anal muscles without squeezing the buttock muscles.  Common Errors:  Breath holding: If you are holding your breath, you may be bearing down against your bladder instead of pulling it up. If you belly bulges up while you are squeezing, you are holding your breath. Be sure to breathe gently in and out while exercising. Counting out loud may help you avoid holding your breath.  Accessory muscle use: You should not see or feel other muscle movement when performing pelvic floor exercises. When done properly, no one can tell that you are performing the exercises. Keep the buttocks, belly and inner thighs relaxed.  Overdoing it: Your muscles can fatigue and stop working for you if you over-exercise. You may actually leak more or feel soreness at the lower abdomen or rectum.  YOUR HOME EXERCISE PROGRAM  LONG HOLDS: Position: on back  Inhale and then exhale. Then squeeze the muscle and count aloud for 3 seconds. Rest with three long breaths. (Be sure to let belly sink in with exhales and not push outward)  Perform 5 repetitions, 3 times/day  SHORT HOLDS: Position: on back, sitting   Inhale and then exhale. Then squeeze the muscle.  (Be sure to  let belly sink in with exhales and not push outward)  Perform 5 repetitions, 5  Times/day  **ALSO SQUEEZE BEFORE YOUR SNEEZE, COUGH, LAUGH to decrease downward pressure   **ALSO EXHALE BEFORE YOU RISE AGAINST GRAVITY (lifting, sit to stand, from squat to stand)   _______  DO NOT PERFORM KEGELS WHEN CATHETER IS IN.  RESUME AFTER IT HAS BEEN REMOVED BUT START 3sec, 3x initially and then progress to 3sec x 5 x after 2 weeks and eventually 5 sec x 5 reps long term

## 2017-06-29 NOTE — Therapy (Signed)
[place] MAIN [facility] SERVICES [address] [place], [place], [address] Phone: [phone]   Fax:  [phone]  Physical Therapy Treatment / Discharge Summary  Patient Details  Name: Jerry Welch MRN: [id number] Date of Birth: 1951/04/11 Referring Provider: Erlene Quan  Encounter Date: 06/28/2017      PT End of Session - 06/28/17 1107    Visit Number 4   Number of Visits 6   Date for PT Re-Evaluation 07/04/17   Authorization Type g -code    PT Start Time 1107   PT Stop Time 1115   PT Time Calculation (min) 8 min   Activity Tolerance Patient tolerated treatment well;No increased pain   Behavior During Therapy WFL for tasks assessed/performed      Past Medical History:  Diagnosis Date  . Back pain   . Cancer [facility])    prostate  . GERD (gastroesophageal reflux disease)   . History of hiatal hernia   . HLD (hyperlipidemia) 04/30/2015  . Hypertension   . Osteoarthrosis involving multiple sites   . Ruptured lumbar disc    L4 & L5  . Sleep apnea     Past Surgical History:  Procedure Laterality Date  . APPENDECTOMY    . BACK SURGERY  09/2013   L4-L5   . BASAL CELL CARCINOMA EXCISION  2012   right neck   . PROSTATE BIOPSY    . 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 REPLACEMENT TOTAL KNEE     right x 2    There were no vitals filed for this visit.      Subjective Assessment - 06/28/17 1123    Subjective Pt reports his stomach bulge is less since he has been doing the exercises.                          [place] Adult PT Treatment/Exercise - 06/28/17 1124      Therapeutic Activites    Therapeutic Activities --  see pt instructions     Neuro Re-ed    Neuro Re-ed Details  --                PT Education - 06/28/17 1125    Education provided Yes   Education Details HEP , d/c    Person(s) Educated Patient   Methods Explanation;Demonstration;Tactile cues;Verbal cues;Handout   Comprehension  Returned demonstration;Verbalized understanding             PT Long Term Goals - 06/28/17 1107      PT LONG TERM GOAL #1   Title Pt will dem o less forward head and demo auditory mateus aligned at shoulder in order to optimize intraabdominal pressure system to minimzie leakage and LBP   Baseline acromium to auditory mateus: L 17 cm, R 15 cm ( 10/4: B 16 cm)    Time 12   Period Weeks   Status Achieved     PT LONG TERM GOAL #2   Title Pt will decrease his ODI score from 14% to < 7% in order to improve ADLs   Time 12   Period Weeks   Status Achieved     PT LONG TERM GOAL #3   Title Pt will demo less separation of his abdominal mm ( 4 fingers width to < 2 fingers) in order to resturn motility and postural stability to standing on concrete at work     Time 12  Period Weeks   Status Achieved     PT LONG TERM GOAL #4   Title Pt will demo Grade 3/3/3/3 in order to progress with pelvic floor strengthening to minimize urinary leakage   Time 12   Period Weeks   Status Achieved               Plan - 06/29/17 1826    Clinical Impression Statement Pt has achieved 100% of his goals. Pt's LBP score decreased and he also demonstrates improved low back pain, less abdominal separation /diastasis recti, decreased lower abdominal scar restrictions, and more upright posture. With improvement of his previous deficits,  pt has a more optimal intraabdominal pressure system to yield more positive outcomes with urinary continence post surgery.   He demo  IND with pelvic floor training program pre and post prostatectomy. Pt voiced understanding to not perform pelvic floor contractions while wearing catheter and how to progress strengthening program gradually post surgery. Pt is ready for d/c at this time. Pt may benefit from further PT if he has any musculoskeletal issues post-surgery.  Thank you for your referral.   Rehab Potential Good   PT Frequency 1x / week   PT Duration 6 weeks   PT  Treatment/Interventions Neuromuscular re-education;Manual techniques;Functional mobility training;Therapeutic activities;Moist Heat;Therapeutic exercise;Scar mobilization;Manual lymph drainage;Taping;Splinting;Energy conservation;Wheelchair mobility training;Stair training;Balance training;Gait training   Consulted and Agree with Plan of Care Patient      Patient will benefit from skilled therapeutic intervention in order to improve the following deficits and impairments:  Pain, Postural dysfunction, Increased muscle spasms, Hypermobility, Decreased mobility, Decreased coordination, Improper body mechanics, Decreased safety awareness, Decreased balance, Decreased endurance, Decreased activity tolerance, Decreased range of motion, Decreased strength, Hypomobility, Difficulty walking, Obesity  Visit Diagnosis: Other symptoms and signs involving the musculoskeletal system  Diastasis recti  Other abnormalities of gait and mobility       G-Codes - 07-12-17 1126    Functional Assessment Tool Used (Outpatient Only) clinical judgement    Functional Limitation Mobility: Walking and moving around   Mobility: Walking and Moving Around Current Status [phone]) At least 1 percent but less than 20 percent impaired, limited or restricted   Mobility: Walking and Moving Around Goal Status [phone]) At least 1 percent but less than 20 percent impaired, limited or restricted   Mobility: Walking and Moving Around Discharge Status [phone]) 0 percent impaired, limited or restricted      Problem List Patient Active Problem List   Diagnosis Date Noted  . HLD (hyperlipidemia) 04/30/2015  . BP (high blood pressure) 04/30/2015  . Arthritis, degenerative 04/30/2015  . Elevated glucose 12/02/2014  . Elevated serum creatinine 12/02/2014  . Leg swelling 12/02/2014  . Obesity 12/02/2014  . Preop cardiovascular exam 10/06/2013  . 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 Abnormal prostate specific  antigen 07/23/2013  . Benign prostatic hyperplasia with urinary obstruction 07/23/2013  . Excessive urination at night 07/23/2013  . Decreased urine stream 07/23/2013  . FOM (frequency of micturition) 07/23/2013  . Elevated prostate specific antigen (PSA) 07/23/2013  . Slowing of urinary stream 07/23/2013  . HTN (hypertension) 07/26/2011  . Somnolence 07/26/2011  . SOB (shortness of breath) 07/26/2011    Jerl Mina ,PT, DPT, E-RYT  06/29/2017, 6:28 PM  [place] MAIN [facility] SERVICES [address] [place], [place], [address] Phone: [phone]   Fax:  [phone]  Name: MATHIEU SCHLOEMER MRN: [id number] Date of Birth: 1951/06/19

## 2017-07-09 ENCOUNTER — Inpatient Hospital Stay: Payer: BLUE CROSS/BLUE SHIELD | Admitting: Anesthesiology

## 2017-07-09 ENCOUNTER — Encounter: Payer: Self-pay | Admitting: *Deleted

## 2017-07-09 ENCOUNTER — Encounter
Admission: RE | Disposition: A | Discharge status: Home / Self Care | Payer: Self-pay | Source: Ambulatory Visit | Attending: Urology

## 2017-07-09 ENCOUNTER — Inpatient Hospital Stay
Admission: RE | Admit: 2017-07-09 | Discharge: 2017-07-10 | DRG: 708 | Disposition: A | Discharge status: Home / Self Care | Payer: BLUE CROSS/BLUE SHIELD | Source: Ambulatory Visit | Attending: Urology | Admitting: Urology

## 2017-07-09 DIAGNOSIS — Z87891 Personal history of nicotine dependence: Secondary | ICD-10-CM

## 2017-07-09 DIAGNOSIS — K219 Gastro-esophageal reflux disease without esophagitis: Secondary | ICD-10-CM | POA: Diagnosis present

## 2017-07-09 DIAGNOSIS — N401 Enlarged prostate with lower urinary tract symptoms: Secondary | ICD-10-CM | POA: Diagnosis present

## 2017-07-09 DIAGNOSIS — G4730 Sleep apnea, unspecified: Secondary | ICD-10-CM | POA: Diagnosis present

## 2017-07-09 DIAGNOSIS — R350 Frequency of micturition: Secondary | ICD-10-CM | POA: Diagnosis present

## 2017-07-09 DIAGNOSIS — Z8249 Family history of ischemic heart disease and other diseases of the circulatory system: Secondary | ICD-10-CM

## 2017-07-09 DIAGNOSIS — Z888 Allergy status to other drugs, medicaments and biological substances status: Secondary | ICD-10-CM | POA: Diagnosis not present

## 2017-07-09 DIAGNOSIS — Z9049 Acquired absence of other specified parts of digestive tract: Secondary | ICD-10-CM

## 2017-07-09 DIAGNOSIS — N529 Male erectile dysfunction, unspecified: Secondary | ICD-10-CM | POA: Diagnosis present

## 2017-07-09 DIAGNOSIS — E669 Obesity, unspecified: Secondary | ICD-10-CM | POA: Diagnosis present

## 2017-07-09 DIAGNOSIS — K660 Peritoneal adhesions (postprocedural) (postinfection): Secondary | ICD-10-CM | POA: Diagnosis present

## 2017-07-09 DIAGNOSIS — Z79899 Other long term (current) drug therapy: Secondary | ICD-10-CM

## 2017-07-09 DIAGNOSIS — N189 Chronic kidney disease, unspecified: Secondary | ICD-10-CM | POA: Diagnosis present

## 2017-07-09 DIAGNOSIS — Z85828 Personal history of other malignant neoplasm of skin: Secondary | ICD-10-CM

## 2017-07-09 DIAGNOSIS — Z96653 Presence of artificial knee joint, bilateral: Secondary | ICD-10-CM | POA: Diagnosis present

## 2017-07-09 DIAGNOSIS — R351 Nocturia: Secondary | ICD-10-CM | POA: Diagnosis present

## 2017-07-09 DIAGNOSIS — C61 Malignant neoplasm of prostate: Secondary | ICD-10-CM | POA: Diagnosis present

## 2017-07-09 DIAGNOSIS — I129 Hypertensive chronic kidney disease with stage 1 through stage 4 chronic kidney disease, or unspecified chronic kidney disease: Secondary | ICD-10-CM | POA: Diagnosis present

## 2017-07-09 DIAGNOSIS — M1990 Unspecified osteoarthritis, unspecified site: Secondary | ICD-10-CM | POA: Diagnosis present

## 2017-07-09 DIAGNOSIS — Z6831 Body mass index (BMI) 31.0-31.9, adult: Secondary | ICD-10-CM

## 2017-07-09 HISTORY — PX: PELVIC LYMPH NODE DISSECTION: SHX6543

## 2017-07-09 HISTORY — PX: ROBOT ASSISTED LAPAROSCOPIC RADICAL PROSTATECTOMY: SHX5141

## 2017-07-09 LAB — POCT I-STAT 4, (NA,K, GLUC, HGB,HCT)
GLUCOSE: 125 mg/dL — AB (ref 65–99)
HEMATOCRIT: 43 % (ref 39.0–52.0)
HEMOGLOBIN: 14.6 g/dL (ref 13.0–17.0)
POTASSIUM: 3.2 mmol/L — AB (ref 3.5–5.1)
SODIUM: 141 mmol/L (ref 135–145)

## 2017-07-09 LAB — ABO/RH: ABO/RH(D): A POS

## 2017-07-09 SURGERY — ROBOTIC ASSISTED LAPAROSCOPIC RADICAL PROSTATECTOMY
Anesthesia: General | Site: Abdomen | Wound class: Clean Contaminated

## 2017-07-09 MED ORDER — BELLADONNA ALKALOIDS-OPIUM 16.2-60 MG RE SUPP: 1.0000 | Freq: Four times a day (QID) | RECTAL | Status: DC | PRN

## 2017-07-09 MED ORDER — CEFAZOLIN SODIUM 1 G IJ SOLR
INTRAMUSCULAR | Status: AC
  Filled 2017-07-09: qty 20

## 2017-07-09 MED ORDER — DEXAMETHASONE SODIUM PHOSPHATE 10 MG/ML IJ SOLN
INTRAMUSCULAR | Status: DC | PRN
  Administered 2017-07-09: 10 mg via INTRAVENOUS

## 2017-07-09 MED ORDER — ALBUTEROL SULFATE (2.5 MG/3ML) 0.083% IN NEBU
2.5000 mg | INHALATION_SOLUTION | Freq: Four times a day (QID) | RESPIRATORY_TRACT | Status: DC | PRN
Start: 2017-07-09 — End: 2017-07-10

## 2017-07-09 MED ORDER — ONDANSETRON HCL 4 MG/2ML IJ SOLN
INTRAMUSCULAR | Status: AC
  Filled 2017-07-09: qty 2

## 2017-07-09 MED ORDER — ACETAMINOPHEN 10 MG/ML IV SOLN
INTRAVENOUS | Status: AC
  Filled 2017-07-09: qty 100

## 2017-07-09 MED ORDER — SODIUM CHLORIDE FLUSH 0.9 % IV SOLN
INTRAVENOUS | Status: AC
  Filled 2017-07-09: qty 10

## 2017-07-09 MED ORDER — POTASSIUM CHLORIDE CRYS ER 10 MEQ PO TBCR
10.0000 meq | EXTENDED_RELEASE_TABLET | Freq: Every day | ORAL | Status: DC
  Administered 2017-07-09 – 2017-07-10 (×2): 10 meq via ORAL
  Filled 2017-07-09 (×2): qty 1

## 2017-07-09 MED ORDER — VERAPAMIL HCL ER 240 MG PO TBCR
240.0000 mg | EXTENDED_RELEASE_TABLET | Freq: Two times a day (BID) | ORAL | Status: DC
  Administered 2017-07-09 – 2017-07-10 (×2): 240 mg via ORAL
  Filled 2017-07-09 (×3): qty 2
  Filled 2017-07-09: qty 1

## 2017-07-09 MED ORDER — FENTANYL CITRATE (PF) 250 MCG/5ML IJ SOLN
INTRAMUSCULAR | Status: AC
  Filled 2017-07-09: qty 5

## 2017-07-09 MED ORDER — LOSARTAN POTASSIUM-HCTZ 100-25 MG PO TABS: 1.0000 | ORAL_TABLET | Freq: Every day | ORAL | Status: DC

## 2017-07-09 MED ORDER — MIDAZOLAM HCL 2 MG/2ML IJ SOLN
INTRAMUSCULAR | Status: DC | PRN
  Administered 2017-07-09: 2 mg via INTRAVENOUS

## 2017-07-09 MED ORDER — ROCURONIUM BROMIDE 100 MG/10ML IV SOLN
INTRAVENOUS | Status: DC | PRN
  Administered 2017-07-09: 10 mg via INTRAVENOUS
  Administered 2017-07-09 (×2): 20 mg via INTRAVENOUS
  Administered 2017-07-09 (×2): 10 mg via INTRAVENOUS

## 2017-07-09 MED ORDER — THROMBIN 5000 UNITS EX SOLR
CUTANEOUS | Status: DC | PRN
  Administered 2017-07-09: 5000 [IU] via TOPICAL

## 2017-07-09 MED ORDER — OXYBUTYNIN CHLORIDE 5 MG PO TABS: 5.0000 mg | ORAL_TABLET | Freq: Three times a day (TID) | ORAL | Status: DC | PRN

## 2017-07-09 MED ORDER — FENTANYL CITRATE (PF) 100 MCG/2ML IJ SOLN
INTRAMUSCULAR | Status: AC
  Administered 2017-07-09: 25 ug via INTRAVENOUS
  Filled 2017-07-09: qty 2

## 2017-07-09 MED ORDER — ONDANSETRON HCL 4 MG/2ML IJ SOLN
INTRAMUSCULAR | Status: DC | PRN
  Administered 2017-07-09: 4 mg via INTRAVENOUS

## 2017-07-09 MED ORDER — ONDANSETRON HCL 4 MG/2ML IJ SOLN
4.0000 mg | INTRAMUSCULAR | Status: DC | PRN
  Administered 2017-07-09: 4 mg via INTRAVENOUS
  Filled 2017-07-09: qty 2

## 2017-07-09 MED ORDER — ACETAMINOPHEN 325 MG PO TABS
650.0000 mg | ORAL_TABLET | ORAL | Status: DC | PRN
Start: 2017-07-09 — End: 2017-07-10
  Filled 2017-07-09: qty 2

## 2017-07-09 MED ORDER — PROPOFOL 10 MG/ML IV BOLUS
INTRAVENOUS | Status: DC | PRN
  Administered 2017-07-09: 200 mg via INTRAVENOUS

## 2017-07-09 MED ORDER — CEFAZOLIN SODIUM-DEXTROSE 1-4 GM/50ML-% IV SOLN
1.0000 g | Freq: Three times a day (TID) | INTRAVENOUS | Status: AC
  Administered 2017-07-09 – 2017-07-10 (×2): 1 g via INTRAVENOUS
  Filled 2017-07-09 (×2): qty 50

## 2017-07-09 MED ORDER — SODIUM CHLORIDE 0.9 % IV SOLN
INTRAVENOUS | Status: DC
  Administered 2017-07-09 – 2017-07-10 (×3): via INTRAVENOUS

## 2017-07-09 MED ORDER — LACTATED RINGERS IV SOLN
INTRAVENOUS | Status: DC
  Administered 2017-07-09: 07:00:00 via INTRAVENOUS

## 2017-07-09 MED ORDER — ROCURONIUM BROMIDE 50 MG/5ML IV SOLN
INTRAVENOUS | Status: AC
  Filled 2017-07-09: qty 1

## 2017-07-09 MED ORDER — THROMBIN 5000 UNITS EX SOLR
CUTANEOUS | Status: AC
  Filled 2017-07-09: qty 5000

## 2017-07-09 MED ORDER — LACTATED RINGERS IV SOLN
INTRAVENOUS | Status: DC | PRN
  Administered 2017-07-09: 08:00:00 via INTRAVENOUS

## 2017-07-09 MED ORDER — FENTANYL CITRATE (PF) 100 MCG/2ML IJ SOLN
INTRAMUSCULAR | Status: DC | PRN
Start: 2017-07-09 — End: 2017-07-09
  Administered 2017-07-09 (×4): 50 ug via INTRAVENOUS
  Administered 2017-07-09: 100 ug via INTRAVENOUS
  Administered 2017-07-09: 50 ug via INTRAVENOUS

## 2017-07-09 MED ORDER — SEVOFLURANE IN SOLN
RESPIRATORY_TRACT | Status: AC
  Filled 2017-07-09: qty 250

## 2017-07-09 MED ORDER — SUCCINYLCHOLINE CHLORIDE 20 MG/ML IJ SOLN
INTRAMUSCULAR | Status: DC | PRN
  Administered 2017-07-09: 100 mg via INTRAVENOUS

## 2017-07-09 MED ORDER — HYDROCHLOROTHIAZIDE 25 MG PO TABS
25.0000 mg | ORAL_TABLET | Freq: Every day | ORAL | Status: DC
  Administered 2017-07-09 – 2017-07-10 (×2): 25 mg via ORAL
  Filled 2017-07-09 (×2): qty 1

## 2017-07-09 MED ORDER — DIPHENHYDRAMINE HCL 12.5 MG/5ML PO ELIX
12.5000 mg | ORAL_SOLUTION | Freq: Four times a day (QID) | ORAL | Status: DC | PRN
  Filled 2017-07-09: qty 5

## 2017-07-09 MED ORDER — PANTOPRAZOLE SODIUM 40 MG PO TBEC
40.0000 mg | DELAYED_RELEASE_TABLET | Freq: Every day | ORAL | Status: DC
  Administered 2017-07-09 – 2017-07-10 (×2): 40 mg via ORAL
  Filled 2017-07-09 (×2): qty 1

## 2017-07-09 MED ORDER — ONDANSETRON HCL 4 MG/2ML IJ SOLN: 4.0000 mg | Freq: Once | INTRAMUSCULAR | Status: DC | PRN

## 2017-07-09 MED ORDER — CEFAZOLIN SODIUM-DEXTROSE 2-4 GM/100ML-% IV SOLN
INTRAVENOUS | Status: AC
  Filled 2017-07-09: qty 100

## 2017-07-09 MED ORDER — SUCCINYLCHOLINE CHLORIDE 20 MG/ML IJ SOLN
INTRAMUSCULAR | Status: AC
  Filled 2017-07-09: qty 1

## 2017-07-09 MED ORDER — CEFAZOLIN SODIUM-DEXTROSE 2-4 GM/100ML-% IV SOLN
2.0000 g | INTRAVENOUS | Status: AC
  Administered 2017-07-09 (×2): 2 g via INTRAVENOUS

## 2017-07-09 MED ORDER — DOCUSATE SODIUM 100 MG PO CAPS
100.0000 mg | ORAL_CAPSULE | Freq: Two times a day (BID) | ORAL | Status: DC
  Administered 2017-07-09 – 2017-07-10 (×3): 100 mg via ORAL
  Filled 2017-07-09 (×3): qty 1

## 2017-07-09 MED ORDER — BUPIVACAINE HCL 0.5 % IJ SOLN
INTRAMUSCULAR | Status: DC | PRN
  Administered 2017-07-09: 20 mL

## 2017-07-09 MED ORDER — SUGAMMADEX SODIUM 200 MG/2ML IV SOLN
INTRAVENOUS | Status: DC | PRN
  Administered 2017-07-09: 200 mg via INTRAVENOUS

## 2017-07-09 MED ORDER — OXYCODONE-ACETAMINOPHEN 5-325 MG PO TABS
1.0000 | ORAL_TABLET | ORAL | Status: DC | PRN
  Administered 2017-07-09 (×2): 1 via ORAL
  Administered 2017-07-09: 2 via ORAL
  Administered 2017-07-10 (×2): 1 via ORAL
  Filled 2017-07-09 (×6): qty 1

## 2017-07-09 MED ORDER — LOSARTAN POTASSIUM 50 MG PO TABS
100.0000 mg | ORAL_TABLET | Freq: Every day | ORAL | Status: DC
  Administered 2017-07-09 – 2017-07-10 (×2): 100 mg via ORAL
  Filled 2017-07-09 (×2): qty 2

## 2017-07-09 MED ORDER — MIDAZOLAM HCL 2 MG/2ML IJ SOLN
INTRAMUSCULAR | Status: AC
  Filled 2017-07-09: qty 2

## 2017-07-09 MED ORDER — LIDOCAINE HCL (CARDIAC) 20 MG/ML IV SOLN
INTRAVENOUS | Status: DC | PRN
  Administered 2017-07-09: 100 mg via INTRAVENOUS

## 2017-07-09 MED ORDER — DEXAMETHASONE SODIUM PHOSPHATE 10 MG/ML IJ SOLN
INTRAMUSCULAR | Status: AC
  Filled 2017-07-09: qty 1

## 2017-07-09 MED ORDER — BUPIVACAINE HCL (PF) 0.5 % IJ SOLN
INTRAMUSCULAR | Status: AC
  Filled 2017-07-09: qty 30

## 2017-07-09 MED ORDER — LIDOCAINE HCL (PF) 2 % IJ SOLN
INTRAMUSCULAR | Status: AC
  Filled 2017-07-09: qty 10

## 2017-07-09 MED ORDER — ACETAMINOPHEN 10 MG/ML IV SOLN
INTRAVENOUS | Status: DC | PRN
  Administered 2017-07-09: 1000 mg via INTRAVENOUS

## 2017-07-09 MED ORDER — PROPOFOL 10 MG/ML IV BOLUS
INTRAVENOUS | Status: AC
  Filled 2017-07-09: qty 20

## 2017-07-09 MED ORDER — FENTANYL CITRATE (PF) 100 MCG/2ML IJ SOLN
25.0000 ug | INTRAMUSCULAR | Status: DC | PRN
  Administered 2017-07-09 (×4): 25 ug via INTRAVENOUS

## 2017-07-09 MED ORDER — DIPHENHYDRAMINE HCL 50 MG/ML IJ SOLN: 12.5000 mg | Freq: Four times a day (QID) | INTRAMUSCULAR | Status: DC | PRN

## 2017-07-09 MED ORDER — SUGAMMADEX SODIUM 200 MG/2ML IV SOLN
INTRAVENOUS | Status: AC
  Filled 2017-07-09: qty 2

## 2017-07-09 MED ORDER — FENTANYL CITRATE (PF) 100 MCG/2ML IJ SOLN
INTRAMUSCULAR | Status: AC
  Filled 2017-07-09: qty 2

## 2017-07-09 MED ORDER — MORPHINE SULFATE (PF) 2 MG/ML IV SOLN: 2.0000 mg | INTRAVENOUS | Status: DC | PRN

## 2017-07-09 MED ORDER — HEPARIN SODIUM (PORCINE) 5000 UNIT/ML IJ SOLN
5000.0000 [IU] | Freq: Three times a day (TID) | INTRAMUSCULAR | Status: DC
  Administered 2017-07-09 – 2017-07-10 (×2): 5000 [IU] via SUBCUTANEOUS
  Filled 2017-07-09 (×2): qty 1

## 2017-07-09 SURGICAL SUPPLY — 110 items
ADH SKN CLS APL DERMABOND .7 (GAUZE/BANDAGES/DRESSINGS) ×2
AGENT HMST MTR 8 SURGIFLO (HEMOSTASIS) ×2
ANCHOR TIS RET SYS 235ML (MISCELLANEOUS) ×2 IMPLANT
APL ESCP 34 STRL LF DISP (HEMOSTASIS) ×2
APPLICATOR SURGIFLO ENDO (HEMOSTASIS) ×4 IMPLANT
APPLIER CLIP LOGIC TI 5 (MISCELLANEOUS) IMPLANT
APR CLP MED LRG 33X5 (MISCELLANEOUS)
BAG TISS RTRVL C235 10X14 (MISCELLANEOUS) ×2
BAG URO DRAIN 2000ML W/SPOUT (MISCELLANEOUS) ×4 IMPLANT
BLADE CLIPPER SURG (BLADE) ×4 IMPLANT
BULB RESERV EVAC DRAIN JP 100C (MISCELLANEOUS) IMPLANT
CANISTER SUCT 1200ML W/VALVE (MISCELLANEOUS) ×4 IMPLANT
CATH DRAINAGE MALECOT 26FR (CATHETERS) ×2 IMPLANT
CATH FOL 2WAY LX 18X5 (CATHETERS) ×6 IMPLANT
CATH MALECOT (CATHETERS) ×4
CHLORAPREP W/TINT 26ML (MISCELLANEOUS) ×8 IMPLANT
CLIP SUT LAPRA TY ABSORB (SUTURE) IMPLANT
CLIP VESOLOCK LG 6/CT PURPLE (CLIP) ×10 IMPLANT
CORD BIP STRL DISP 12FT (MISCELLANEOUS) ×4 IMPLANT
CORD MONOPOLAR M/FML 12FT (MISCELLANEOUS) ×4 IMPLANT
COVER TIP SHEARS 8 DVNC (MISCELLANEOUS) ×2 IMPLANT
COVER TIP SHEARS 8MM DA VINCI (MISCELLANEOUS) ×2
CUTTER ECHEON FLEX ENDO 45 340 (ENDOMECHANICALS) ×2 IMPLANT
DEFOGGER SCOPE WARMER CLEARIFY (MISCELLANEOUS) ×4 IMPLANT
DERMABOND ADVANCED (GAUZE/BANDAGES/DRESSINGS) ×2
DERMABOND ADVANCED .7 DNX12 (GAUZE/BANDAGES/DRESSINGS) ×2 IMPLANT
DRAIN CHANNEL JP 15F RND 16 (MISCELLANEOUS) IMPLANT
DRAIN CHANNEL JP 19F (MISCELLANEOUS) IMPLANT
DRAPE LEGGINS SURG 28X43 STRL (DRAPES) ×4 IMPLANT
DRAPE SHEET LG 3/4 BI-LAMINATE (DRAPES) ×6 IMPLANT
DRAPE SURG 17X11 SM STRL (DRAPES) ×16 IMPLANT
DRAPE TABLE BACK 80X90 (DRAPES) ×4 IMPLANT
DRAPE UNDER BUTTOCK W/FLU (DRAPES) ×4 IMPLANT
DRIVER LRG NEEDLE DA VINCI (INSTRUMENTS) ×4
DRIVER NDLE LRG DVNC (INSTRUMENTS) ×4 IMPLANT
DRSG TELFA 3X8 NADH (GAUZE/BANDAGES/DRESSINGS) ×4 IMPLANT
ELECT REM PT RETURN 9FT ADLT (ELECTROSURGICAL) ×4
ELECTRODE REM PT RTRN 9FT ADLT (ELECTROSURGICAL) ×2 IMPLANT
FILTER LAP SMOKE EVAC STRL (MISCELLANEOUS) IMPLANT
FORCEPS MARYLAND BIPOLAR 8X55 (INSTRUMENTS) ×2
FORCEPS MRYLND BPLR 8X55 DVNC (INSTRUMENTS) ×2 IMPLANT
GLOVE BIO SURGEON STRL SZ 6.5 (GLOVE) ×9 IMPLANT
GLOVE BIO SURGEONS STRL SZ 6.5 (GLOVE) ×3
GOWN STRL REUS W/ TWL LRG LVL3 (GOWN DISPOSABLE) ×6 IMPLANT
GOWN STRL REUS W/TWL LRG LVL3 (GOWN DISPOSABLE) ×24
GRASPER SUT TROCAR 14GX15 (MISCELLANEOUS) ×4 IMPLANT
HEMOSTAT SURGICEL 2X14 (HEMOSTASIS) IMPLANT
HOLDER FOLEY CATH W/STRAP (MISCELLANEOUS) ×4 IMPLANT
IRRIGATION STRYKERFLOW (MISCELLANEOUS) ×2 IMPLANT
IRRIGATOR STRYKERFLOW (MISCELLANEOUS) ×4
IV LACTATED RINGERS 1000ML (IV SOLUTION) ×4 IMPLANT
JELLY LUB 2OZ STRL (MISCELLANEOUS) ×2
JELLY LUBE 2OZ STRL (MISCELLANEOUS) IMPLANT
KIT ACCESSORY DA VINCI DISP (KITS) ×2
KIT ACCESSORY DVNC DISP (KITS) ×2 IMPLANT
KIT PINK PAD W/HEAD ARE REST (MISCELLANEOUS) ×4
KIT PINK PAD W/HEAD ARM REST (MISCELLANEOUS) ×2 IMPLANT
LABEL OR SOLS (LABEL) ×2 IMPLANT
MARKER SKIN DUAL TIP RULER LAB (MISCELLANEOUS) ×4 IMPLANT
NDL HYPO 25X1 1.5 SAFETY (NEEDLE) ×2 IMPLANT
NDL INSUFFLATION 14GA 120MM (NEEDLE) ×2 IMPLANT
NDL SAFETY 18GX1.5 (NEEDLE) ×2 IMPLANT
NEEDLE HYPO 25X1 1.5 SAFETY (NEEDLE) ×4 IMPLANT
NEEDLE INSUFFLATION 14GA 120MM (NEEDLE) ×4 IMPLANT
NS IRRIG 500ML POUR BTL (IV SOLUTION) ×4 IMPLANT
PACK LAP CHOLECYSTECTOMY (MISCELLANEOUS) ×4 IMPLANT
PAD DRESSING TELFA 3X8 NADH (GAUZE/BANDAGES/DRESSINGS) ×2 IMPLANT
PENCIL ELECTRO HAND CTR (MISCELLANEOUS) ×4 IMPLANT
PROGRASP ENDOWRIST DA VINCI (INSTRUMENTS) ×2
PROGRASP ENDOWRIST DVNC (INSTRUMENTS) ×2 IMPLANT
RELOAD STAPLE 60 2.6 WHT THN (STAPLE) IMPLANT
RELOAD STAPLER WHITE 60MM (STAPLE) IMPLANT
RELOAD WH ECHELON 45 (STAPLE) ×2 IMPLANT
SCISSORS METZENBAUM CVD 33 (INSTRUMENTS) IMPLANT
SLEEVE ENDOPATH XCEL 5M (ENDOMECHANICALS) ×8 IMPLANT
SOLUTION ELECTROLUBE (MISCELLANEOUS) ×4 IMPLANT
SPOGE SURGIFLO 8M (HEMOSTASIS) ×2
SPONGE LAP 4X18 5PK (MISCELLANEOUS) ×2 IMPLANT
SPONGE SURGIFLO 8M (HEMOSTASIS) ×2 IMPLANT
SPONGE VERSALON 4X4 4PLY (MISCELLANEOUS) ×2 IMPLANT
STAPLE ECHEON FLEX 60 POW ENDO (STAPLE) IMPLANT
STAPLER RELOAD WHITE 60MM (STAPLE)
STAPLER SKIN PROX 35W (STAPLE) ×4 IMPLANT
STRAP SAFETY BODY (MISCELLANEOUS) ×4 IMPLANT
SUT DVC VLOC 90 3-0 CV23 UNDY (SUTURE) ×6 IMPLANT
SUT DVC VLOC 90 3-0 CV23 VLT (SUTURE) ×4
SUT ETHILON 3-0 FS-10 30 BLK (SUTURE)
SUT MNCRL 4-0 (SUTURE) ×4
SUT MNCRL 4-0 27XMFL (SUTURE) ×2
SUT PROLENE 5 0 RB 1 DA (SUTURE) IMPLANT
SUT SILK 2 0 SH (SUTURE) ×4 IMPLANT
SUT VIC AB 0 CT1 36 (SUTURE) ×8 IMPLANT
SUT VIC AB 2-0 CT1 (SUTURE) ×6 IMPLANT
SUT VIC AB 2-0 SH 27 (SUTURE) ×4
SUT VIC AB 2-0 SH 27XBRD (SUTURE) ×4 IMPLANT
SUT VICRYL 0 AB UR-6 (SUTURE) ×4 IMPLANT
SUTURE DVC VLC 90 3-0 CV23 VLT (SUTURE) ×2 IMPLANT
SUTURE EHLN 3-0 FS-10 30 BLK (SUTURE) IMPLANT
SUTURE MNCRL 4-0 27XMF (SUTURE) ×4 IMPLANT
SYR BULB IRRIG 60ML STRL (SYRINGE) ×4 IMPLANT
SYRINGE 10CC LL (SYRINGE) ×4 IMPLANT
SYRINGE IRR TOOMEY STRL 70CC (SYRINGE) ×2 IMPLANT
TAPE CLOTH 10X20 WHT NS LF (TAPE) ×4 IMPLANT
TAPE CLOTH 2X10 WHT NS LF (TAPE) ×8
TROCAR DISP BLADELESS 8 DVNC (TROCAR) ×2 IMPLANT
TROCAR DISP BLADELESS 8MM (TROCAR) ×2
TROCAR ENDOPATH XCEL 12X100 BL (ENDOMECHANICALS) ×6 IMPLANT
TROCAR XCEL 12X100 BLDLESS (ENDOMECHANICALS) ×6 IMPLANT
TROCAR XCEL NON-BLD 5MMX100MML (ENDOMECHANICALS) ×4 IMPLANT
TUBING INSUFFLATOR HI FLOW (MISCELLANEOUS) ×4 IMPLANT

## 2017-07-09 NOTE — Anesthesia Procedure Notes (Signed)
Procedure Name: Intubation Date/Time: 07/09/2017 8:02 AM Performed by: Aline Brochure Pre-anesthesia Checklist: Patient identified, Emergency Drugs available, Suction available and Patient being monitored Patient Re-evaluated:Patient Re-evaluated prior to induction Oxygen Delivery Method: Circle system utilized Preoxygenation: Pre-oxygenation with 100% oxygen Induction Type: IV induction Ventilation: Mask ventilation without difficulty Laryngoscope Size: Mac and 4 Grade View: Grade I Tube type: Oral Tube size: 7.5 mm Number of attempts: 1 Airway Equipment and Method: Stylet Placement Confirmation: ETT inserted through vocal cords under direct vision,  positive ETCO2 and breath sounds checked- equal and bilateral Secured at: 22 cm Tube secured with: Tape Dental Injury: Teeth and Oropharynx as per pre-operative assessment

## 2017-07-09 NOTE — Progress Notes (Signed)
// //  7:29 AM    / Jerry Welch 26-Jan-1951 [id number]  Referring provider: [name], MD [address] [facility] [place], [place] [address]  Chief Complaint  Patient presents with  . Follow-up    CT results    HPI: 66 year-old male with newly dx prostate cancer who returns to the office today s/p pelvic CT with contrast as part of staging for newly dx prostate cancer.     History of elevated PSA Status post prostate biopsy on 09/08/2013 at which time his PSA was 4.8. revealing several foci of high-grade PIN and with a focus at the right base. His TRUS volume at the time was 57 cc.   His PSA subsequently rose to 7.07 on 05/2014 and he went repeat prostate biopsy on 09/2014 showed chronic inflammation but no evidence of malignancy, repeat TRUS 24 cc. Endorectal MRI 02/2016 was equivical, no evidence of high grade diease.    PSA continues to rise slowly, most recently up to 9.6 from 8.2 He is now status post fusion biopsy. This revealed 1 core Gleason 3+4 using the standard template on the left lateral mid prostate, 5%. Additionally, at the MRI lesion #2, all 3 cores were positive for Gleason 3+4 disease ranging from 40-50% (anterior left mid).  Previous abdominal surgeries include open appendectomy with subsequent lower abdominal midline exploratory laparoscopy for an abscess 34 years ago. He's also had left inguinal hernia repair at age 67.  Pelvic CT with contrast reviewed today personally- disk.  No obvious metastatic diease or enlarged LN.  BPH with LUTS Nocturia improving x 0-2 with CPAP He does have some mild frequency, urgency He does also have baseline CKD,  Cr 1.16 on 08/2016 Overall does not want to keep trying medication  ED He does also report baseline erectile dysfunction but is not particularly bothered by this at this time.      IPSS    Row Name 05/22/17 1300         International Prostate Symptom Score   How often have you had the  sensation of not emptying your bladder? More than half the time     How often have you had to urinate less than every two hours? Almost always     How often have you found you stopped and started again several times when you urinated? Almost always     How often have you found it difficult to postpone urination? Almost always     How often have you had a weak urinary stream? About half the time     How often have you had to strain to start urination? About half the time     How many times did you typically get up at night to urinate? 3 Times     Total IPSS Score 28       Quality of Life due to urinary symptoms   If you were to spend the rest of your life with your urinary condition just the way it is now how would you feel about that? Mixed        Score:  1-7 Mild 8-19 Moderate 20-35 Severe      SHIM    Row Name 05/22/17 1300         SHIM: Over the last 6 months:   How do you rate your confidence that you could get and keep an erection? Very Low     When you had erections with sexual stimulation, how often  were your erections hard enough for penetration (entering your partner)? Sometimes (about half the time)     During sexual intercourse, how often were you able to maintain your erection after you had penetrated (entered) your partner? Most Times (much more than half the time)     During sexual intercourse, how difficult was it to maintain your erection to completion of intercourse? Difficult     When you attempted sexual intercourse, how often was it satisfactory for you? A Few Times (much less than half the time)       SHIM Total Score   SHIM 13         PMH: Past Medical History:  Diagnosis Date  . Back pain   . Cancer [facility])    prostate  . GERD (gastroesophageal reflux disease)   . History of hiatal hernia   . HLD (hyperlipidemia) 04/30/2015  . Hypertension   . Osteoarthrosis involving multiple sites   . Ruptured lumbar disc    L4 & L5  . Sleep apnea      Surgical History: Past Surgical History:  Procedure Laterality Date  . APPENDECTOMY    . BACK SURGERY  09/2013   L4-L5   . BASAL CELL CARCINOMA EXCISION  2012   right neck   . PROSTATE BIOPSY    . 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 REPLACEMENT TOTAL KNEE     right x 2    Home Medications:  Allergies as of 06/14/2017      Reactions   Amlodipine Other (See Comments)   LE EDEMA   Lisinopril    Cough-onset      Medication List       Accurate as of 06/14/17 11:59 PM. Always use your most recent med list.          albuterol 108 (90 Base) MCG/ACT inhaler Commonly known as:  PROVENTIL HFA;VENTOLIN HFA Inhale 2 puffs into the lungs every 6 (six) hours as needed for wheezing or shortness of breath.   cyanocobalamin 1000 MCG/ML injection Commonly known as:  (VITAMIN B-12) Inject 1,000 mcg into the muscle every 14 (fourteen) days.   losartan-hydrochlorothiazide 100-25 MG tablet Commonly known as:  HYZAAR Take 1 tablet by mouth daily.   naproxen sodium 220 MG tablet Commonly known as:  ANAPROX Take 440 mg by mouth daily.   omeprazole 20 MG capsule Commonly known as:  PRILOSEC Take 20 mg by mouth daily.   potassium chloride 10 MEQ tablet Commonly known as:  K-DUR Take 10 mEq by mouth daily.   verapamil 240 MG (CO) 24 hr tablet Commonly known as:  COVERA HS Take 240 mg by mouth 2 (two) times daily.       Allergies:  Allergies  Allergen Reactions  . Amlodipine Other (See Comments)    LE EDEMA  . Lisinopril     Cough-onset     Family History: Family History  Problem Relation Age of Onset  . Heart failure Mother   . Heart attack Maternal Grandfather   . Bladder Cancer Neg Hx   . Prostate cancer Neg Hx   . Kidney cancer Neg Hx     Social History:  reports that he quit smoking about 36 years ago. His smoking use included Cigarettes. He smoked 1.00 pack per day. He has never used smokeless tobacco. He reports that he drinks about 0.5 oz of  alcohol per week . He reports that he does not use drugs.  ROS: 12  point review systems negative other Urinary symptoms as above, leg swelling, back and joint pain.  Physical Exam: BP (!) 154/95 (BP Location: Left Arm, Patient Position: Sitting, Cuff Size: Normal)   Pulse 73   Ht 5\' 10"  (1.778 m)   Wt 217 lb (98.4 kg)   BMI 31.14 kg/m   Constitutional:  Alert and oriented, No acute distress HEENT: [place] AT, moist mucus membranes.  Trachea midline, no masses. Cardiovascular: No clubbing, cyanosis, or edema. Respiratory: Normal respiratory effort, no increased work of breathing. Abdomen: Right lower abdominal open appendectomy incision. Lower abdominal midline incision. Left inguinal incision not appreciated. Skin: No rashes, bruises or suspicious lesions. Neurologic: Grossly intact, no focal deficits, moving all 4 extremities. Psychiatric: Normal mood and affect.  Laboratory Data:  Component     Latest Ref Rng & Units 04/30/2015 11/18/2015 04/11/2016 10/06/2016  Prostate Specific Ag, Serum     0.0 - 4.0 ng/mL 6.9 (H) 8.0 (H) 8.3 (H) 8.2 (H)   Component     Latest Ref Rng & Units 04/16/2017  Prostate Specific Ag, Serum     0.0 - 4.0 ng/mL 9.6 (H)    Urinalysis N/a  Imaging: As above, disk reviewed personally today.  Assessment & Plan:  66 year-old male with history of elevated PSA status post negative biopsy 2 s/p endorectal MRI And no fusion biopsy revealing intermediate risk Gleason 3+4 involving 4 of 18 cores.    1. Prostate cancer Newly dx cT1c intermediate risk prostate cancer involving 4/18 cores up to 50% on the left mid anterior and left mid lateral anterior.  He has elected to procede with RALP + BPLND  We discussed surgical therapy for prostate cancer including the different available surgical approaches. We discussed, in detail, the risks and expectations of surgery with regard to cancer control, urinary control, and erectile dysfunction as well as expected post  operative cover he processed. Additional risks of surgery including but not limalited to bleeding, infection, hernia formation, nerve damage, steel formation, bowel/rect injury, potentially necessitating colostomy, damage to the urinary tract resulting in urinary leakage, urethral stricture, and cardiopulmonary risk such as myocardial infarction, stroke, death, thromboembolism etc. were explained. The risk of open surgical conversion for robotics/laparoscopic prostatectomy is also discussed.  PReop CT pelvis negative.  S/p pelvic floor therapy.    All questions answered.  2. Urinary frequency Not interested in pharmacotherapy.  3. Other male erectile dysfunction No intervention desired at this time, we will continue to follow. Given his severe ED, will likely not perform nerve sparing at the time of his procedure.   Hollice Espy, MD  [facility] [address] [place], [place], [place] [address] [phone]

## 2017-07-09 NOTE — Anesthesia Post-op Follow-up Note (Signed)
Anesthesia QCDR form completed.        

## 2017-07-09 NOTE — Op Note (Signed)
07/09/17  PREOPERATIVE DIAGNOSIS: Prostate cancer.  POSTOPERATIVE DIAGNOSIS: Prostate cancer.  OPERATION PERFORMED: 1. DaVinci laparoscopic radical prostatectomy (non- nerve sparing) 2 DaVinci laproscopic bilateral pelvic lymph node dissection. 3. Extensive lysis of adhesions greater than 30 min  SURGEON: Hollice Espy, MD  ASSISTANTS: Liliane Bade, PA  ANESTHESIA: General.  EBL: 300 cc  SPECIMEN: Prostate with bilateral seminal vesicals, bilateral pelvic lymph nodes, anterior fat pad.  FINDINGS: Accessory Pudendal Vessel: none  INDICATION: Pt.is a 66 year old male with Gleason 3+4 prostate cancer. Treatment options were discussed with him at length and he chose DaVinci radical prostatectomy.  Bilateral pelvic lymph node dissection was planned due to his risk stratification.  Very poor baseline erectile function, non-nerve sparing discussed.    PROCEDURE IN DETAIL: Patient was given Ancef preoperatively. He had sequential compression devices applied preoperatively for DVT prophylaxis. He was taken to the operating room where he was induced with general anesthesia. After adequate anesthesia, he was placed in the dorsal lithotomy position. His arms were draped by his side and was appropriately padded and secured to the operating room table. He was placed in the Trendelenburg position.  He was prepped and draped in sterile fashion. An 38 French Foley was placed in the bladder and instilled with 15 cc sterile water. Orogastric tube was placed. The Veress needle was passed just Two finger breaths below the costal margin in the midclavicular line and the abdomen was insufflated to 15 Atmospheres.  A 5 mm trocar was placed in the left lower quadrant at the site of the planned robotic lap site using a visual obturator and a 0 lens. A few pelvic adhesions were appreciated, but none at the site of the planned trocar placement. A 12 mm, blunt-tip trocar was placed just  above the umbilicus. The remainder of the trocars were placed under direct vision; 8 mm robotic trocars were placed 9 cm laterally and inferiorly to the initially placed umbilical trocar. A third one was placed 7 cm lateral to the left-sided trocar. In the corresponding position on the right side, a 12 mm trocar was placed, and then a 5 mm trocar was placed to the right and well above the umbilicus.  The robot was then docked with the robot trocar. I used the zero-degree camera. I had the hot scissors in the right hand and the left hand with the [place] bipolar and far left hand the Prograsp forceps.   At this point in time, an extensive lysis of adhesions was performed. Particular, there were small bowel adherent to the right lower quadrant with dense adhesions which were all taken down. This allowed for visualization of the iliac vessels as well as the inguinal ring. There is also some adhesions to the anterior abdominal wall which were taken down. Notably, there was a dense adhesion from the anterior bladder wall to the anterior abdominal wall. When taken down, there was a small concern for bladder injury at this point time. The bladder was then filled with greater than 300 cc of sterile water which ruled out any leakage or violation of the bladder. Lysis of adhesions was performed over a greater than 30 minute period. Once this was complete, the remainder for the procedure was performed uneventfully as below.   Initially I divided the median umbilical ligament bilaterally and the urachus and developed the space of Retzius down to pubic bone. I divided the parietal peritoneum laterally up to the vas deferens on each side. I used the progress forceps to provide cranial traction  on the urachus. I cleaned off the Endopelvic fascia on each side and then divided it with the scissors laterally to the perirectal fat and medially to the puboprostatic ligaments which were divided. I then ligated  the dorsal vein complex using a 45 mm vascular load stapler.   I then addressed the bladder neck with a 30-degree down lens. I identified the bladder neck by pulling on the Foley catheter. I divided the anterior bladder neck musculature until I then found the anterior bladder neck mucosa which was incised. I identified the Foley catheter within, deflated the balloon, pulled the Foley out through this opening and then using the Carter-Thomason needle with a #0-Vicryl suture, passed through The suprapubic region and pulled the suture through the eye of the Foley and then back out. This allowed me to provide upward traction on the prostate. I then divided the lateral bladder neck mucosa and the posterior bladder neck mucosa. I was well away from ureteral orifices. I divided the posterior bladder neck musculature until I identified the vas deferens. They were freed proximally, then divided. I freed up the seminal vesicals using blunt and sharp dissection. Judicious use of electrocautery was used near the seminal vesicle tips.  I then went back to the 0-degree lens. I divided the Denonvilliers fascia beneath the prostate and developed the prostate off the rectum. I then did a bilateral nonnerve sparing by  creating a plane which was extrafascial. I then isolated the pedicles of the prostate and placed weck clips on the pedicles of the prostate and then divided it with cold scissors. I continued to divide the neurovascular bundles off the prostate out to the apex of the prostate. At this point the prostate was freed up except for the urethra. I addressed the prostate anteriorly, divided the dorsal vein , then the anterior urethral wall, pulled the Foley catheter back and then divided the posterior urethral wall. Specimen was completely freed up. I placed the prostate in an Endo catch bag and then placed the bag in the upper abdomen out of the way. I then irrigated the pelvis. The rectal test  was negative. There was reasonable hemostasis.  I then did the pelvic lymph node dissection by incising the fascia overlying the right external iliac vein, dissecting distally. I went just distal to the node of [place]et where we placed clips and then divided the lymphatics. The lateral aspect of the dissection was the pelvic side wall, inferior was the obturator nerve and proximal the hypogastric vessels. I placed clips at the proximal aspect and then divided the lymphatics. This was removed with the spoon grasper and sent to pathology.   I then did the left obturator lymph node dissection in the same fashion as the left side.  Surgicel was placed into both of the beds of the lymph node fossa for additional hemostasis.   With good hemostasis, I then did the posterior reconstruction. I used a 3-0 VLoc suture on an RB1 through the cut edge of Denonvilliers fascia beneath the bladder on the right side and through the posterior striated sphincter underneath the urethra. This brought the bladder neck and urethra and closer proximity to help facilitate anastomosis.   I then did the urethral vesicle anastomosis again with two 3-0 VLoc sutures on an RB1 needle interlocked. I passed both ends of the suture from the outside-in through the bladder neck at the 6 o'clock position. I passed both through the urethral stump from the inside-out in the corresponding position. I  reapproximated the bladder neck to the urethra. I then ran the Left suture on the left side anastomosis to the 9 o'clock position. Then I went back to the right sided suture and ran that up the right side to the 12 o'clock position. I then continued the left suture to the 12 o'clock position.The suture was then suspended anteriorly behind the pubic bone.   I then placed a new 47 French Foley into the bladder and filled it with 10 cc sterile water. I irrigated the bladder with 160 cc. There was no leakage. There was  reasonable hemostasis.  Surgicel was used on either side of the pedicles for an additional hemostasis.  The instruments were then removed. The robot was undocked and all the trocars were removed under direct vision. There was good hemostasis. I then enlarged the umbilical trocar site large enough to remove the prostate and I closed the fascia here with #0-0 Vicryl suture in a running fashion. All the port sites were irrigated. Lidocaine was injected into all the trocar sites. The skin was closed with 4-0 Monocryl in running subcuticular fashion. Dermabond was applied.   At this point patient was awakened and extubated in the operating room and taken to the recovery room in stable condition. There were no complications. All counts correct.  Hollice Espy, MD

## 2017-07-09 NOTE — Interval H&P Note (Signed)
History and Physical Interval Note:  07/09/2017 7:35 AM  Gaylen W Wrightsman  has presented today for surgery, with the diagnosis of PROSTATE CANCER  The various methods of treatment have been discussed with the patient and family. After consideration of risks, benefits and other options for treatment, the patient has consented to  Procedure(s): ROBOTIC ASSISTED LAPAROSCOPIC RADICAL PROSTATECTOMY (N/A) PELVIC LYMPH NODE DISSECTION (Bilateral) as a surgical intervention .  The patient's history has been reviewed, patient examined, no change in status, stable for surgery.  I have reviewed the patient's chart and labs.  Questions were answered to the patient's satisfaction.    RRR CTAB   Hollice Espy

## 2017-07-09 NOTE — H&P (View-Only) (Signed)
// //  7:29 AM    / Jerry Welch 11/20/1950 [id number]  Referring provider: [name], MD [address] [facility] [name], [name] [address]  Chief Complaint  Patient presents with  . Follow-up    CT results    HPI: 66 year-old male with newly dx prostate cancer who returns to the office today s/p pelvic CT with contrast as part of staging for newly dx prostate cancer.     History of elevated PSA Status post prostate biopsy on 09/08/2013 at which time his PSA was 4.8. revealing several foci of high-grade PIN and with a focus at the right base. His TRUS volume at the time was 57 cc.   His PSA subsequently rose to 7.07 on 05/2014 and he went repeat prostate biopsy on 09/2014 showed chronic inflammation but no evidence of malignancy, repeat TRUS 24 cc. Endorectal MRI 02/2016 was equivical, no evidence of high grade diease.    PSA continues to rise slowly, most recently up to 9.6 from 8.2 He is now status post fusion biopsy. This revealed 1 core Gleason 3+4 using the standard template on the left lateral mid prostate, 5%. Additionally, at the MRI lesion #2, all 3 cores were positive for Gleason 3+4 disease ranging from 40-50% (anterior left mid).  Previous abdominal surgeries include open appendectomy with subsequent lower abdominal midline exploratory laparoscopy for an abscess 34 years ago. He's also had left inguinal hernia repair at age 89.  Pelvic CT with contrast reviewed today personally- disk.  No obvious metastatic diease or enlarged LN.  BPH with LUTS Nocturia improving x 0-2 with CPAP He does have some mild frequency, urgency He does also have baseline CKD,  Cr 1.16 on 08/2016 Overall does not want to keep trying medication  ED He does also report baseline erectile dysfunction but is not particularly bothered by this at this time.      IPSS    Row Name 05/22/17 1300         International Prostate Symptom Score   How often have you had the  sensation of not emptying your bladder? More than half the time     How often have you had to urinate less than every two hours? Almost always     How often have you found you stopped and started again several times when you urinated? Almost always     How often have you found it difficult to postpone urination? Almost always     How often have you had a weak urinary stream? About half the time     How often have you had to strain to start urination? About half the time     How many times did you typically get up at night to urinate? 3 Times     Total IPSS Score 28       Quality of Life due to urinary symptoms   If you were to spend the rest of your life with your urinary condition just the way it is now how would you feel about that? Mixed        Score:  1-7 Mild 8-19 Moderate 20-35 Severe      SHIM    Row Name 05/22/17 1300         SHIM: Over the last 6 months:   How do you rate your confidence that you could get and keep an erection? Very Low     When you had erections with sexual stimulation, how often  were your erections hard enough for penetration (entering your partner)? Sometimes (about half the time)     During sexual intercourse, how often were you able to maintain your erection after you had penetrated (entered) your partner? Most Times (much more than half the time)     During sexual intercourse, how difficult was it to maintain your erection to completion of intercourse? Difficult     When you attempted sexual intercourse, how often was it satisfactory for you? A Few Times (much less than half the time)       SHIM Total Score   SHIM 13         PMH: Past Medical History:  Diagnosis Date  . Back pain   . Cancer [employer])    prostate  . GERD (gastroesophageal reflux disease)   . History of hiatal hernia   . HLD (hyperlipidemia) 04/30/2015  . Hypertension   . Osteoarthrosis involving multiple sites   . Ruptured lumbar disc    L4 & L5  . Sleep apnea      Surgical History: Past Surgical History:  Procedure Laterality Date  . APPENDECTOMY    . BACK SURGERY  09/2013   L4-L5   . BASAL CELL CARCINOMA EXCISION  2012   right neck   . PROSTATE BIOPSY    . 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 REPLACEMENT TOTAL KNEE     right x 2    Home Medications:  Allergies as of 06/14/2017      Reactions   Amlodipine Other (See Comments)   LE EDEMA   Lisinopril    Cough-onset      Medication List       Accurate as of 06/14/17 11:59 PM. Always use your most recent med list.          albuterol 108 (90 Base) MCG/ACT inhaler Commonly known as:  PROVENTIL HFA;VENTOLIN HFA Inhale 2 puffs into the lungs every 6 (six) hours as needed for wheezing or shortness of breath.   cyanocobalamin 1000 MCG/ML injection Commonly known as:  (VITAMIN B-12) Inject 1,000 mcg into the muscle every 14 (fourteen) days.   losartan-hydrochlorothiazide 100-25 MG tablet Commonly known as:  HYZAAR Take 1 tablet by mouth daily.   naproxen sodium 220 MG tablet Commonly known as:  ANAPROX Take 440 mg by mouth daily.   omeprazole 20 MG capsule Commonly known as:  PRILOSEC Take 20 mg by mouth daily.   potassium chloride 10 MEQ tablet Commonly known as:  K-DUR Take 10 mEq by mouth daily.   verapamil 240 MG (CO) 24 hr tablet Commonly known as:  COVERA HS Take 240 mg by mouth 2 (two) times daily.       Allergies:  Allergies  Allergen Reactions  . Amlodipine Other (See Comments)    LE EDEMA  . Lisinopril     Cough-onset     Family History: Family History  Problem Relation Age of Onset  . Heart failure Mother   . Heart attack Maternal Grandfather   . Bladder Cancer Neg Hx   . Prostate cancer Neg Hx   . Kidney cancer Neg Hx     Social History:  reports that he quit smoking about 36 years ago. His smoking use included Cigarettes. He smoked 1.00 pack per day. He has never used smokeless tobacco. He reports that he drinks about 0.5 oz of  alcohol per week . He reports that he does not use drugs.  ROS: 12  point review systems negative other Urinary symptoms as above, leg swelling, back and joint pain.  Physical Exam: BP (!) 154/95 (BP Location: Left Arm, Patient Position: Sitting, Cuff Size: Normal)   Pulse 73   Ht 5\' 10"  (1.778 m)   Wt 217 lb (98.4 kg)   BMI 31.14 kg/m   Constitutional:  Alert and oriented, No acute distress HEENT: [place] AT, moist mucus membranes.  Trachea midline, no masses. Cardiovascular: No clubbing, cyanosis, or edema. Respiratory: Normal respiratory effort, no increased work of breathing. Abdomen: Right lower abdominal open appendectomy incision. Lower abdominal midline incision. Left inguinal incision not appreciated. Skin: No rashes, bruises or suspicious lesions. Neurologic: Grossly intact, no focal deficits, moving all 4 extremities. Psychiatric: Normal mood and affect.  Laboratory Data:  Component     Latest Ref Rng & Units 04/30/2015 11/18/2015 04/11/2016 10/06/2016  Prostate Specific Ag, Serum     0.0 - 4.0 ng/mL 6.9 (H) 8.0 (H) 8.3 (H) 8.2 (H)   Component     Latest Ref Rng & Units 04/16/2017  Prostate Specific Ag, Serum     0.0 - 4.0 ng/mL 9.6 (H)    Urinalysis N/a  Imaging: As above, disk reviewed personally today.  Assessment & Plan:  66 year-old male with history of elevated PSA status post negative biopsy 2 s/p endorectal MRI And no fusion biopsy revealing intermediate risk Gleason 3+4 involving 4 of 18 cores.    1. Prostate cancer Newly dx cT1c intermediate risk prostate cancer involving 4/18 cores up to 50% on the left mid anterior and left mid lateral anterior.  He has elected to procede with RALP + BPLND  We discussed surgical therapy for prostate cancer including the different available surgical approaches. We discussed, in detail, the risks and expectations of surgery with regard to cancer control, urinary control, and erectile dysfunction as well as expected post  operative cover he processed. Additional risks of surgery including but not limalited to bleeding, infection, hernia formation, nerve damage, steel formation, bowel/rect injury, potentially necessitating colostomy, damage to the urinary tract resulting in urinary leakage, urethral stricture, and cardiopulmonary risk such as myocardial infarction, stroke, death, thromboembolism etc. were explained. The risk of open surgical conversion for robotics/laparoscopic prostatectomy is also discussed.  PReop CT pelvis negative.  S/p pelvic floor therapy.    All questions answered.  2. Urinary frequency Not interested in pharmacotherapy.  3. Other male erectile dysfunction No intervention desired at this time, we will continue to follow. Given his severe ED, will likely not perform nerve sparing at the time of his procedure.   Hollice Espy, MD  [facility] [address] [place], [place], [place] [address] [phone]

## 2017-07-09 NOTE — Anesthesia Preprocedure Evaluation (Signed)
Anesthesia Evaluation  Patient identified by MRN, date of birth, ID band Patient awake    Reviewed: Allergy & Precautions, NPO status , Patient's Chart, lab work & pertinent test results  Airway Mallampati: III       Dental  (+) Upper Dentures   Pulmonary sleep apnea , former smoker,    breath sounds clear to auscultation       Cardiovascular Exercise Tolerance: Good hypertension, Pt. on medications  Rhythm:Regular Rate:Normal     Neuro/Psych negative neurological ROS  negative psych ROS   GI/Hepatic Neg liver ROS, hiatal hernia, GERD  Medicated,  Endo/Other  negative endocrine ROS  Renal/GU negative Renal ROS  negative genitourinary   Musculoskeletal   Abdominal (+) + obese,   Peds negative pediatric ROS (+)  Hematology negative hematology ROS (+)   Anesthesia Other Findings   Reproductive/Obstetrics                             Anesthesia Physical Anesthesia Plan  ASA: III  Anesthesia Plan: General   Post-op Pain Management:    Induction: Intravenous  PONV Risk Score and Plan: 0  Airway Management Planned: Oral ETT  Additional Equipment:   Intra-op Plan:   Post-operative Plan: Extubation in OR  Informed Consent: I have reviewed the patients History and Physical, chart, labs and discussed the procedure including the risks, benefits and alternatives for the proposed anesthesia with the patient or authorized representative who has indicated his/her understanding and acceptance.     Plan Discussed with: CRNA  Anesthesia Plan Comments:         Anesthesia Quick Evaluation

## 2017-07-09 NOTE — Transfer of Care (Signed)
Immediate Anesthesia Transfer of Care Note  Patient: Jerry Welch  Procedure(s) Performed: ROBOTIC ASSISTED LAPAROSCOPIC RADICAL PROSTATECTOMY (N/A Abdomen) PELVIC LYMPH NODE DISSECTION (Bilateral Abdomen)  Patient Location: PACU  Anesthesia Type:General  Level of Consciousness: sedated  Airway & Oxygen Therapy: Patient connected to face mask oxygen  Post-op Assessment: Post -op Vital signs reviewed and stable  Post vital signs: stable  Last Vitals:  Vitals:   07/09/17 0619 07/09/17 1255  BP: (!) 153/107 (!) 156/96  Pulse: 75 87  Resp: 18 15  Temp: (!) 36.1 C   SpO2: 99% 100%    Last Pain:  Vitals:   07/09/17 0619  TempSrc: Tympanic         Complications: No apparent anesthesia complications

## 2017-07-10 ENCOUNTER — Encounter: Payer: Self-pay | Admitting: Urology

## 2017-07-10 LAB — BASIC METABOLIC PANEL
ANION GAP: 8 (ref 5–15)
BUN: 15 mg/dL (ref 6–20)
CO2: 24 mmol/L (ref 22–32)
Calcium: 7.9 mg/dL — ABNORMAL LOW (ref 8.9–10.3)
Chloride: 105 mmol/L (ref 101–111)
Creatinine, Ser: 1.14 mg/dL (ref 0.61–1.24)
Glucose, Bld: 153 mg/dL — ABNORMAL HIGH (ref 65–99)
POTASSIUM: 3.5 mmol/L (ref 3.5–5.1)
SODIUM: 137 mmol/L (ref 135–145)

## 2017-07-10 LAB — CBC
HEMATOCRIT: 40.8 % (ref 40.0–52.0)
HEMOGLOBIN: 14.1 g/dL (ref 13.0–18.0)
MCH: 29.6 pg (ref 26.0–34.0)
MCHC: 34.6 g/dL (ref 32.0–36.0)
MCV: 85.6 fL (ref 80.0–100.0)
Platelets: 177 10*3/uL (ref 150–440)
RBC: 4.76 MIL/uL (ref 4.40–5.90)
RDW: 13.7 % (ref 11.5–14.5)
WBC: 11.6 10*3/uL — AB (ref 3.8–10.6)

## 2017-07-10 MED ORDER — OXYCODONE-ACETAMINOPHEN 5-325 MG PO TABS: 1.0000 | ORAL_TABLET | ORAL | 0 refills | Status: DC | PRN

## 2017-07-10 MED ORDER — DOCUSATE SODIUM 100 MG PO CAPS: 100.0000 mg | ORAL_CAPSULE | Freq: Two times a day (BID) | ORAL | 0 refills | Status: DC

## 2017-07-10 NOTE — Anesthesia Postprocedure Evaluation (Signed)
Anesthesia Post Note  Patient: Jerry Welch  Procedure(s) Performed: ROBOTIC ASSISTED LAPAROSCOPIC RADICAL PROSTATECTOMY (N/A Abdomen) PELVIC LYMPH NODE DISSECTION (Bilateral Abdomen)  Patient location during evaluation: PACU Anesthesia Type: General Level of consciousness: awake Pain management: pain level controlled Vital Signs Assessment: post-procedure vital signs reviewed and stable Respiratory status: spontaneous breathing Cardiovascular status: stable Anesthetic complications: no     Last Vitals:  Vitals:   07/09/17 2031 07/10/17 0420  BP:  112/77  Pulse: (!) 106 70  Resp:  20  Temp:  36.8 C  SpO2: 95% 94%    Last Pain:  Vitals:   07/10/17 0530  TempSrc:   PainSc: 7                  VAN STAVEREN,Shonya Sumida

## 2017-07-10 NOTE — Anesthesia Postprocedure Evaluation (Signed)
Anesthesia Post Note  Patient: Jerry Welch  Procedure(s) Performed: ROBOTIC ASSISTED LAPAROSCOPIC RADICAL PROSTATECTOMY (N/A Abdomen) PELVIC LYMPH NODE DISSECTION (Bilateral Abdomen)  Patient location during evaluation: PACU Anesthesia Type: General Level of consciousness: awake Pain management: pain level controlled Vital Signs Assessment: post-procedure vital signs reviewed and stable Respiratory status: nonlabored ventilation Cardiovascular status: stable Anesthetic complications: no     Last Vitals:  Vitals:   07/09/17 2031 07/10/17 0420  BP:  112/77  Pulse: (!) 106 70  Resp:  20  Temp:  36.8 C  SpO2: 95% 94%    Last Pain:  Vitals:   07/10/17 0530  TempSrc:   PainSc: 7                  VAN STAVEREN,Kamare Caspers

## 2017-07-10 NOTE — Discharge Summary (Signed)
Date of admission: 07/09/2017  Date of discharge: 07/10/2017  Admission diagnosis: Prostate cancer  Discharge diagnosis: prostate cancer  Secondary diagnoses:  Patient Active Problem List   Diagnosis Date Noted  . Prostate cancer ([place]) 07/09/2017  . HLD (hyperlipidemia) 04/30/2015  . BP (high blood pressure) 04/30/2015  . Arthritis, degenerative 04/30/2015  . Elevated glucose 12/02/2014  . Elevated serum creatinine 12/02/2014  . Leg swelling 12/02/2014  . Obesity 12/02/2014  . Preop cardiovascular exam 10/06/2013  . 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 Abnormal prostate specific antigen 07/23/2013  . Benign prostatic hyperplasia with urinary obstruction 07/23/2013  . Excessive urination at night 07/23/2013  . Decreased urine stream 07/23/2013  . FOM (frequency of micturition) 07/23/2013  . Elevated prostate specific antigen (PSA) 07/23/2013  . Slowing of urinary stream 07/23/2013  . HTN (hypertension) 07/26/2011  . Somnolence 07/26/2011  . SOB (shortness of breath) 07/26/2011    History and Physical: For full details, please see admission history and physical. Briefly, Jerry Welch is a 66 y.o. year old patient Admitted following radical robotic prostatectomy and bilateral pelvic lymph node dissection.   Hospital Course: Patient tolerated the procedure well.  He was then transferred to the floor after an uneventful PACU stay.  His hospital course was uncomplicated.  On POD#1 he had met discharge criteria: was eating a regular diet, was up and ambulating independently,  pain was well controlled, and was ready to for discharge.  He underwent teaching for Foley catheter care prior to discharge and was comfortable managing this.  Physical Exam  Constitutional: He is oriented to person, place, and time. He appears well-developed.  HENT:  Head: Normocephalic and atraumatic.  Eyes: EOM are normal.  Cardiovascular: Normal rate.   Pulmonary/Chest:  Effort normal. No respiratory distress.  Abdominal: Soft. He exhibits no distension.  Incisions clean dry and intact  Genitourinary: Penis normal.  Genitourinary Comments: Foley catheter in place during clear yellow urine  Neurological: He is alert and oriented to person, place, and time.  Skin: Skin is warm and dry.  Psychiatric: He has a normal mood and affect.  Vitals reviewed.     Laboratory values:   Recent Labs  07/09/17 0646 07/10/17 0318  WBC  --  11.6*  HGB 14.6 14.1  HCT 43.0 40.8    Recent Labs  07/09/17 0646 07/10/17 0318  NA 141 137  K 3.2* 3.5  CL  --  105  CO2  --  24  GLUCOSE 125* 153*  BUN  --  15  CREATININE  --  1.14  CALCIUM  --  7.9*   No results for input(s): LABPT, INR in the last 72 hours. No results for input(s): LABURIN in the last 72 hours. Results for orders placed or performed during the hospital encounter of 06/25/17  Urine culture     Status: None   Collection Time: 06/25/17  9:54 AM  Result Value Ref Range Status   Specimen Description URINE, CLEAN CATCH  Final   Special Requests NONE  Final   Culture   Final    NO GROWTH Performed at [facility], [place] [address] [place], [place] [address]    Report Status 06/26/2017 FINAL  Final    Disposition: Home  Discharge instruction: The patient was instructed to be ambulatory but told to refrain from heavy lifting, strenuous activity, or driving. Routine Foley catheter care.  Discharge medications: Allergies as of 07/10/2017      Reactions   Amlodipine  Other (See Comments)   LE EDEMA   Lisinopril    Cough-onset      Medication List    TAKE these medications   albuterol 108 (90 Base) MCG/ACT inhaler Commonly known as:  PROVENTIL HFA;VENTOLIN HFA Inhale 2 puffs into the lungs every 6 (six) hours as needed for wheezing or shortness of breath.   cyanocobalamin 1000 MCG/ML injection Commonly known as:  (VITAMIN B-12) Inject 1,000 mcg into the muscle every 14 (fourteen)  days.   docusate sodium 100 MG capsule Commonly known as:  COLACE Take 1 capsule (100 mg total) by mouth 2 (two) times daily.   losartan-hydrochlorothiazide 100-25 MG tablet Commonly known as:  HYZAAR Take 1 tablet by mouth daily.   naproxen sodium 220 MG tablet Commonly known as:  ANAPROX Take 440 mg by mouth daily.   omeprazole 20 MG capsule Commonly known as:  PRILOSEC Take 20 mg by mouth daily.   oxyCODONE-acetaminophen 5-325 MG tablet Commonly known as:  PERCOCET Take 1-2 tablets by mouth every 4 (four) hours as needed for moderate pain or severe pain.   potassium chloride 10 MEQ tablet Commonly known as:  K-DUR Take 10 mEq by mouth daily.   verapamil 240 MG (CO) 24 hr tablet Commonly known as:  COVERA HS Take 240 mg by mouth 2 (two) times daily.       Followup:  Follow-up Information    Hollice Espy, MD. Go on 07/16/2017.   Specialty:  Urology Why:  Monday, 10/22 at 8:45 a.m. - nurse visit in 1 week for Foley removal  Friday, 11/9 at 10 a.m. for blood draw  Dr. Erlene Quan, Tuesday, 11/13 at 9 a.m. Contact information: [place] [address] [phone]

## 2017-07-10 NOTE — Progress Notes (Signed)
Pt discharged per MD order. IVs removed. Pt and family education on catheter care. Catheter changed to small bag for travel. Pt given larger bag for at night. Discharge paperwork reviewed with pt. Prescription given to pt. Questions answered to pt and family satisfaction. Pt taken to car in wheelchair by volunteer.

## 2017-07-10 NOTE — Discharge Instructions (Signed)
· Activity:  You are encouraged to ambulate frequently (about every hour during waking hours) to help prevent blood clots from forming in your legs or lungs.  However, you should not engage in any heavy lifting (> 5-10 lbs), strenuous activity, or straining. ° °· Diet: You should advance your diet as instructed by your physician.  It will be normal to have some bloating, nausea, and abdominal discomfort intermittently. ° °· Prescriptions:  You will be provided a prescription for pain medication to take as needed.  If your pain is not severe enough to require the prescription pain medication, you may take extra strength Tylenol instead which will have less side effects.  You should also take a prescribed stool softener to avoid straining with bowel movements as the prescription pain medication may constipate you. ° °· Incisions: You may remove your dressing bandages 48 hours after surgery if not removed in the hospital.  You will either have some small staples or special tissue glue at each of the incision sites. Once the bandages are removed (if present), the incisions may stay open to air.  You may start showering (but not soaking or bathing in water) the 2nd day after surgery and the incisions simply need to be patted dry after the shower.  No additional care is needed. ° °What to call us about: You should call the office if you develop fever > 101 or develop persistent vomiting, redness or draining around your incision, or any other concerning symptoms.   ° °[facility] °[address], [address] °[place], [place] [address] °[phone] ° ° °Indwelling Urinary Catheter Care, Adult °Take good care of your catheter to keep it working and to prevent problems. °How to wear your catheter °Attach your catheter to your leg with tape (adhesive tape) or a leg strap. Make sure it is not too tight. If you use tape, remove any bits of tape that are already on the catheter. °How to wear a drainage  bag °You should have: °· A large overnight bag. °· A small leg bag. ° °Overnight Bag °You may wear the overnight bag at any time. Always keep the bag below the level of your bladder but off the floor. When you sleep, put a clean plastic bag in a wastebasket. Then hang the bag inside the wastebasket. °Leg Bag °Never wear the leg bag at night. Always wear the leg bag below your knee. Keep the leg bag secure with a leg strap or tape. °How to care for your skin °· Clean the skin around the catheter at least once every day. °· Shower every day. Do not take baths. °· Put creams, lotions, or ointments on your genital area only as told by your doctor. °· Do not use powders, sprays, or lotions on your genital area. °How to clean your catheter and your skin °1. Wash your hands with soap and water. °2. Wet a washcloth in warm water and gentle (mild) soap. °3. Use the washcloth to clean the skin where the catheter enters your body. Clean downward and wipe away from the catheter in small circles. Do not wipe toward the catheter. °4. Pat the area dry with a clean towel. Make sure to clean off all soap. °How to care for your drainage bags °Empty your drainage bag when it is ?-½ full or at least 2-3 times a day. Replace your drainage bag once a month or sooner if it starts to smell bad or look dirty. Do not clean your   drainage bag unless told by your doctor. °Emptying a drainage bag ° °Supplies Needed °· Rubbing alcohol. °· Gauze pad or cotton ball. °· Tape or a leg strap. ° °Steps °1. Wash your hands with soap and water. °2. Separate (detach) the bag from your leg. °3. Hold the bag over the toilet or a clean container. Keep the bag below your hips and bladder. This stops pee (urine) from going back into the tube. °4. Open the pour spout at the bottom of the bag. °5. Empty the pee into the toilet or container. Do not let the pour spout touch any surface. °6. Put rubbing alcohol on a gauze pad or cotton ball. °7. Use the gauze pad  or cotton ball to clean the pour spout. °8. Close the pour spout. °9. Attach the bag to your leg with tape or a leg strap. °10. Wash your hands. ° °Changing a drainage bag °Supplies Needed °· Alcohol wipes. °· A clean drainage bag. °· Adhesive tape or a leg strap. ° °Steps °1. Wash your hands with soap and water. °2. Separate the dirty bag from your leg. °3. Pinch the rubber catheter with your fingers so that pee does not spill out. °4. Separate the catheter tube from the drainage tube where these tubes connect (at the connection valve). Do not let the tubes touch any surface. °5. Clean the end of the catheter tube with an alcohol wipe. Use a different alcohol wipe to clean the end of the drainage tube. °6. Connect the catheter tube to the drainage tube of the clean bag. °7. Attach the new bag to the leg with adhesive tape or a leg strap. °8. Wash your hands. ° °How to prevent infection and other problems °· Never pull on your catheter or try to remove it. Pulling can damage tissue in your body. °· Always wash your hands before and after touching your catheter. °· If a leg strap gets wet, replace it with a dry one. °· Drink enough fluids to keep your pee clear or pale yellow, or as told by your doctor. °· Do not let the drainage bag or tubing touch the floor. °· Wear cotton underwear. °· If you are male, wipe from front to back after you poop (have a bowel movement). °· Check on the catheter often to make sure it works and the tubing is not twisted. °Get help if: °· Your pee is cloudy. °· Your pee smells unusually bad. °· Your pee is not draining into the bag. °· Your tube gets clogged. °· Your catheter starts to leak. °· Your bladder feels full. °Get help right away if: °· You have redness, swelling, or pain where the catheter enters your body. °· You have fluid, pus, or a bad smell coming from the area where the catheter enters your body. °· The area where the catheter enters your body feels warm. °· You have a  fever. °· You have pain in your: °? Stomach (abdomen). °? Legs. °? Lower back. °? Bladder. °· You see blood fill the catheter. °· Your pee is pink or red. °· You feel sick to your stomach (nauseous). °· You throw up (vomit). °· You have chills. °· Your catheter gets pulled out. °This information is not intended to replace advice given to you by your health care provider. Make sure you discuss any questions you have with your health care provider. °Document Released: 01/06/2013 Document Revised: 08/09/2016 Document Reviewed: 02/24/2014 °Elsevier Interactive Patient Education © 2018 [employer] ° °

## 2017-07-11 ENCOUNTER — Other Ambulatory Visit: Payer: Self-pay | Admitting: Pathology

## 2017-07-11 LAB — SURGICAL PATHOLOGY

## 2017-07-16 ENCOUNTER — Ambulatory Visit (INDEPENDENT_AMBULATORY_CARE_PROVIDER_SITE_OTHER): Payer: BLUE CROSS/BLUE SHIELD

## 2017-07-16 VITALS — BP 137/93 | HR 97 | Ht 70.0 in | Wt 208.8 lb

## 2017-07-16 DIAGNOSIS — C61 Malignant neoplasm of prostate: Secondary | ICD-10-CM | POA: Diagnosis not present

## 2017-07-16 MED ORDER — OXYCODONE-ACETAMINOPHEN 5-325 MG PO TABS: 1.0000 | ORAL_TABLET | ORAL | 0 refills | Status: DC | PRN

## 2017-07-16 NOTE — Progress Notes (Signed)
Fill and Pull Catheter Removal  Patient is present today for a catheter removal.  Patient was cleaned and prepped in a sterile fashion 11ml of sterile water/ saline was instilled into the bladder when the patient felt the urge to urinate. 86ml of water was then drained from the balloon.  A 18FR foley cath was removed from the bladder no complications were noted. Patient was then given some time to void on their own.  Patient cannot void. Patient was instructed to go home drink plenty of fluids and return around 2:30 p.m today for a bladder scan.   Preformed by: C. Corinna Capra, CMA   Pt returned to office this afternoon:  Bladder Scan Patient can void: 1 ml Performed By: C. Corinna Capra, CMA   Follow up: 4 weeks post-op w/ Dr. Erlene Quan

## 2017-07-23 ENCOUNTER — Telehealth: Payer: Self-pay

## 2017-07-23 NOTE — Telephone Encounter (Signed)
Pt called asking if he was able to ride the lawn mower to cut grass. Made pt aware he should not mow the yard until he follows up with Dr. Erlene Quan and at that point she will either release him or make him aware his restrictions. Pt voiced understanding.

## 2017-08-03 ENCOUNTER — Other Ambulatory Visit: Payer: Medicare Other

## 2017-08-03 DIAGNOSIS — C61 Malignant neoplasm of prostate: Secondary | ICD-10-CM

## 2017-08-04 LAB — PSA: PROSTATE SPECIFIC AG, SERUM: 0.1 ng/mL (ref 0.0–4.0)

## 2017-08-07 ENCOUNTER — Encounter: Payer: Self-pay | Admitting: Radiology

## 2017-08-07 ENCOUNTER — Ambulatory Visit (INDEPENDENT_AMBULATORY_CARE_PROVIDER_SITE_OTHER): Payer: Medicare Other | Admitting: Urology

## 2017-08-07 ENCOUNTER — Encounter: Payer: Self-pay | Admitting: Urology

## 2017-08-07 VITALS — BP 161/94 | HR 72 | Ht 70.0 in | Wt 218.6 lb

## 2017-08-07 DIAGNOSIS — N481 Balanitis: Secondary | ICD-10-CM

## 2017-08-07 DIAGNOSIS — N5231 Erectile dysfunction following radical prostatectomy: Secondary | ICD-10-CM

## 2017-08-07 DIAGNOSIS — C61 Malignant neoplasm of prostate: Secondary | ICD-10-CM

## 2017-08-07 DIAGNOSIS — N393 Stress incontinence (female) (male): Secondary | ICD-10-CM

## 2017-08-07 MED ORDER — NYSTATIN-TRIAMCINOLONE 100000-0.1 UNIT/GM-% EX OINT: 1.0000 "application " | TOPICAL_OINTMENT | Freq: Three times a day (TID) | CUTANEOUS | 1 refills | Status: DC

## 2017-08-07 NOTE — Progress Notes (Signed)
08/07/2017 1:58 PM   Jerry Welch 03/25/1951 [id number]  Referring provider: [name], MD [address] [facility] [place], [place] [address]  Chief Complaint  Patient presents with  . Routine Post Op    HPI: 66 year old male 4 weeks status post robotic radical prostatectomy with bilateral pelvic lymph node dissection.  He returns today for routine follow-up.  Surgical pathology consistent with Gleason 3+4.  There was evidence of extraprostatic extension.  Negative margins.  Negative lymph nodes.  Negative seminal vesicles. pT3a N0.  PSA 0.1 on 08/03/17.  Repeat PSA drawn today and is pending.  He does report that he is doing fairly well until this weekend.  He was at a church function when a dog jumped up on his right lower abdomen.  Since that time, he has had some discomfort around his right lateral most port site.  No erythema or drainage.  This is slowly improving.  Addition to above, she reports that she came home penis.  This is been present and worsening since surgery.  She has been using triple antibiotic cream several times a day without any improvement.  In terms of continence, this is slowly improving.  Initially, he was grossly incontinent.  He now has some control of his urinary stream.  He is wearing 3 depends daily which are saturated variably depending on his activity level.  Most of his improvement has been over the past week.   he has resumed Kegel exercises.  He has not been sexually active.  He is not interested in any intervention for this at this time.    PMH: Past Medical History:  Diagnosis Date  . Back pain   . Cancer [facility])    prostate  . GERD (gastroesophageal reflux disease)   . History of hiatal hernia   . HLD (hyperlipidemia) 04/30/2015  . Hypertension   . Osteoarthrosis involving multiple sites   . Ruptured lumbar disc    L4 & L5  . Sleep apnea     Surgical History: Past Surgical History:  Procedure Laterality Date  .  APPENDECTOMY    . BACK SURGERY  09/2013   L4-L5   . BASAL CELL CARCINOMA EXCISION  2012   right neck   . PELVIC LYMPH NODE DISSECTION Bilateral 07/09/2017   Performed by Hollice Espy, MD at [facility] ORS  . PROSTATE BIOPSY    . 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 REPLACEMENT TOTAL KNEE     right x 2  . ROBOTIC ASSISTED LAPAROSCOPIC RADICAL PROSTATECTOMY N/A 07/09/2017   Performed by Hollice Espy, MD at [facility] ORS    Home Medications:  Allergies as of 08/07/2017      Reactions   Amlodipine Other (See Comments)   LE EDEMA   Lisinopril    Cough-onset      Medication List        Accurate as of 08/07/17 11:59 PM. Always use your most recent med list.          albuterol 108 (90 Base) MCG/ACT inhaler Commonly known as:  PROVENTIL HFA;VENTOLIN HFA Inhale 2 puffs into the lungs every 6 (six) hours as needed for wheezing or shortness of breath.   cyanocobalamin 1000 MCG/ML injection Commonly known as:  (VITAMIN B-12) Inject 1,000 mcg into the muscle every 14 (fourteen) days.   docusate sodium 100 MG capsule Commonly known as:  COLACE Take 1 capsule (100 mg total) by mouth 2 (two) times daily.  losartan-hydrochlorothiazide 100-25 MG tablet Commonly known as:  HYZAAR Take 1 tablet by mouth daily.   naproxen sodium 220 MG tablet Commonly known as:  ALEVE Take 440 mg by mouth daily.   nystatin-triamcinolone ointment Commonly known as:  MYCOLOG Apply 1 application 3 (three) times daily topically.   omeprazole 20 MG capsule Commonly known as:  PRILOSEC Take 20 mg by mouth daily.   potassium chloride 10 MEQ tablet Commonly known as:  K-DUR Take 10 mEq by mouth daily.   verapamil 240 MG (CO) 24 hr tablet Commonly known as:  COVERA HS Take 240 mg by mouth 2 (two) times daily.       Allergies:  Allergies  Allergen Reactions  . Amlodipine Other (See Comments)    LE EDEMA  . Lisinopril     Cough-onset     Family History: Family History  Problem  Relation Age of Onset  . Heart failure Mother   . Heart attack Maternal Grandfather   . Bladder Cancer Neg Hx   . Prostate cancer Neg Hx   . Kidney cancer Neg Hx     Social History:  reports that he quit smoking about 36 years ago. His smoking use included cigarettes. He smoked 1.00 pack per day. he has never used smokeless tobacco. He reports that he drinks about 0.5 oz of alcohol per week. He reports that he does not use drugs.  ROS: UROLOGY Frequent Urination?: Yes Hard to postpone urination?: Yes Burning/pain with urination?: No Get up at night to urinate?: Yes Leakage of urine?: Yes Urine stream starts and stops?: No Trouble starting stream?: No Do you have to strain to urinate?: No Blood in urine?: No Urinary tract infection?: No Sexually transmitted disease?: No Injury to kidneys or bladder?: No Painful intercourse?: No Weak stream?: No Erection problems?: No Penile pain?: No  Gastrointestinal Nausea?: No Vomiting?: No Indigestion/heartburn?: No Diarrhea?: No Constipation?: No  Constitutional Fever: No Night sweats?: No Weight loss?: No Fatigue?: No  Skin Skin rash/lesions?: No Itching?: No  Eyes Blurred vision?: No Double vision?: No  Ears/Nose/Throat Sore throat?: No Sinus problems?: No  Hematologic/Lymphatic Swollen glands?: No Easy bruising?: No  Cardiovascular Leg swelling?: No Chest pain?: No  Respiratory Cough?: No Shortness of breath?: No  Endocrine Excessive thirst?: No  Musculoskeletal Back pain?: No Joint pain?: No  Neurological Headaches?: No Dizziness?: No  Psychologic Depression?: No Anxiety?: No  Physical Exam: BP (!) 161/94 (BP Location: Right Arm, Patient Position: Sitting, Cuff Size: Normal)   Pulse 72   Ht 5\' 10"  (1.778 m)   Wt 218 lb 9.6 oz (99.2 kg)   BMI 31.37 kg/m   Constitutional:  Alert and oriented, No acute distress. HEENT: [place] AT, moist mucus membranes.  Trachea midline, no  masses. Cardiovascular: No clubbing, cyanosis, or edema. Respiratory: Normal respiratory effort, no increased work of breathing. GI: Abdomen is soft, nontender, nondistended, no abdominal masses.  Obese, incisions healing well. GU: Circumcised phallus with erythema and slightly raised rash on glans.   Skin: No rashes, bruises or suspicious lesions. Neurologic: Grossly intact, no focal deficits, moving all 4 extremities. Psychiatric: Normal mood and affect.  Laboratory Data: Lab Results  Component Value Date   WBC 11.6 (H) 07/10/2017   HGB 14.1 07/10/2017   HCT 40.8 07/10/2017   MCV 85.6 07/10/2017   PLT 177 07/10/2017    Lab Results  Component Value Date   CREATININE 1.14 07/10/2017    Lab Results  Component Value Date   PSA1 <0.1  08/07/2017   PSA1 0.1 08/03/2017   PSA1 9.6 (H) 04/16/2017    Urinalysis N/a  Pertinent Imaging: N/a  Assessment & Plan:    1. Prostate cancer ([facility]) p T3a N0 PSA this was drawn less than 4 weeks after surgery Repeat PSA today  PSA is undetectable, will repeat an increase interval thereafter - PSA  2. Stress incontinence, male Reassured, should improve with time Continue to encourage weight loss and pelvic floor therapy  3. Balanitis Rash most consistent with candidal infection with contact dermatitis Recommend discontinuation of triple antibiotic Mycolog ointment  prescribed RTC in 1 week if no improvement  4. Erectile dysfunction after radical prostatectomy Discussed options for treatment and intracavernosal injection Not currently interested in further intervention Encourage penile stretching due to concern for loss of penile elasticity   Return in about 3 months (around 11/07/2017) for PSA .  Hollice Espy, MD  [facility] [address], [place], [place] [address] [phone]

## 2017-08-08 ENCOUNTER — Telehealth: Payer: Self-pay | Admitting: Family Medicine

## 2017-08-08 LAB — PSA

## 2017-08-08 NOTE — Telephone Encounter (Signed)
-----   Message from Hollice Espy, MD sent at 08/08/2017  7:53 AM EST ----- PSA undetectable!!!!!!!!!  Great news.    Hollice Espy, MD

## 2017-08-08 NOTE — Telephone Encounter (Signed)
Patient notified

## 2017-09-25 DIAGNOSIS — C61 Malignant neoplasm of prostate: Secondary | ICD-10-CM

## 2017-09-25 HISTORY — DX: Malignant neoplasm of prostate: C61

## 2017-11-05 ENCOUNTER — Other Ambulatory Visit: Payer: Medicare Other

## 2017-11-05 ENCOUNTER — Other Ambulatory Visit: Payer: Self-pay

## 2017-11-05 DIAGNOSIS — C61 Malignant neoplasm of prostate: Secondary | ICD-10-CM

## 2017-11-06 LAB — PSA

## 2017-11-07 ENCOUNTER — Ambulatory Visit (INDEPENDENT_AMBULATORY_CARE_PROVIDER_SITE_OTHER): Payer: BLUE CROSS/BLUE SHIELD | Admitting: Urology

## 2017-11-07 ENCOUNTER — Encounter: Payer: Self-pay | Admitting: Urology

## 2017-11-07 VITALS — BP 157/78 | HR 84 | Ht 68.0 in | Wt 217.0 lb

## 2017-11-07 DIAGNOSIS — N5231 Erectile dysfunction following radical prostatectomy: Secondary | ICD-10-CM

## 2017-11-07 DIAGNOSIS — N393 Stress incontinence (female) (male): Secondary | ICD-10-CM | POA: Diagnosis not present

## 2017-11-07 DIAGNOSIS — C61 Malignant neoplasm of prostate: Secondary | ICD-10-CM | POA: Diagnosis not present

## 2017-11-07 NOTE — Progress Notes (Signed)
11/07/2017 9:15 AM   Jerry Welch 07/24/1951 [id number]  Referring provider: [name], MD [address] [place], [place] [address]  Chief Complaint  Patient presents with  . Prostate Cancer    6month    HPI: 67 year old male 3 months status post robotic radical prostatectomy with bilateral pelvic lymph node dissection.  He returns today for routine follow-up.  Surgical pathology consistent with Gleason 3+4.  There was evidence of extraprostatic extension.  Negative margins.  Negative lymph nodes.  Negative seminal vesicles. pT3a N0.  PSA remains undetectable today.  In terms of continence, he is down to 1-3 pads depending on active level.  These are not saturated.  He continues to do [employer] .  He continues to see improvement.     He has not had any erections.  He has tried unsuccessfully.  He is not interested in any further treatment.    Balanitis is completely resolved.  PMH: Past Medical History:  Diagnosis Date  . Back pain   . Cancer [facility])    prostate  . GERD (gastroesophageal reflux disease)   . History of hiatal hernia   . HLD (hyperlipidemia) 04/30/2015  . Hypertension   . Osteoarthrosis involving multiple sites   . Ruptured lumbar disc    L4 & L5  . Sleep apnea     Surgical History: Past Surgical History:  Procedure Laterality Date  . APPENDECTOMY    . BACK SURGERY  09/2013   L4-L5   . BASAL CELL CARCINOMA EXCISION  2012   right neck   . PELVIC LYMPH NODE DISSECTION Bilateral 07/09/2017   Procedure: PELVIC LYMPH NODE DISSECTION;  Surgeon: Hollice Espy, MD;  Location: [facility] ORS;  Service: Urology;  Laterality: Bilateral;  . PROSTATE BIOPSY    . 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 REPLACEMENT TOTAL KNEE     right x 2  . ROBOT ASSISTED LAPAROSCOPIC RADICAL PROSTATECTOMY N/A 07/09/2017   Procedure: ROBOTIC ASSISTED LAPAROSCOPIC RADICAL PROSTATECTOMY;  Surgeon: Hollice Espy, MD;  Location: [facility] ORS;  Service:  Urology;  Laterality: N/A;    Home Medications:  Allergies as of 11/07/2017      Reactions   Amlodipine Other (See Comments)   LE EDEMA   Lisinopril    Cough-onset      Medication List        Accurate as of 11/07/17  9:15 AM. Always use your most recent med list.          albuterol 108 (90 Base) MCG/ACT inhaler Commonly known as:  PROVENTIL HFA;VENTOLIN HFA Inhale 2 puffs into the lungs every 6 (six) hours as needed for wheezing or shortness of breath.   cyanocobalamin 1000 MCG/ML injection Commonly known as:  (VITAMIN B-12) Inject 1,000 mcg into the muscle every 14 (fourteen) days.   losartan-hydrochlorothiazide 100-25 MG tablet Commonly known as:  HYZAAR Take 1 tablet by mouth daily.   naproxen sodium 220 MG tablet Commonly known as:  ALEVE Take 440 mg by mouth daily.   omeprazole 20 MG capsule Commonly known as:  PRILOSEC Take 20 mg by mouth daily.   potassium chloride 10 MEQ tablet Commonly known as:  K-DUR Take 10 mEq by mouth daily.   valsartan-hydrochlorothiazide 320-25 MG tablet Commonly known as:  DIOVAN-HCT   verapamil 240 MG (CO) 24 hr tablet Commonly known as:  COVERA HS Take 240 mg by mouth 2 (two) times daily.       Allergies:  Allergies  Allergen Reactions  . Amlodipine Other (See Comments)    LE EDEMA  . Lisinopril     Cough-onset     Family History: Family History  Problem Relation Age of Onset  . Heart failure Mother   . Heart attack Maternal Grandfather   . Bladder Cancer Neg Hx   . Prostate cancer Neg Hx   . Kidney cancer Neg Hx     Social History:  reports that he quit smoking about 36 years ago. His smoking use included cigarettes. He smoked 1.00 pack per day. he has never used smokeless tobacco. He reports that he drinks about 0.5 oz of alcohol per week. He reports that he does not use drugs.  ROS: UROLOGY Frequent Urination?: No Hard to postpone urination?: No Burning/pain with urination?: No Get up at night to  urinate?: No Leakage of urine?: Yes Urine stream starts and stops?: No Trouble starting stream?: No Do you have to strain to urinate?: No Blood in urine?: No Urinary tract infection?: No Sexually transmitted disease?: No Injury to kidneys or bladder?: No Painful intercourse?: No Weak stream?: No Erection problems?: No Penile pain?: No  Gastrointestinal Nausea?: No Vomiting?: No Indigestion/heartburn?: No Diarrhea?: No Constipation?: No  Constitutional Fever: No Night sweats?: No Weight loss?: No Fatigue?: No  Skin Skin rash/lesions?: No Itching?: No  Eyes Blurred vision?: No Double vision?: No  Ears/Nose/Throat Sore throat?: No Sinus problems?: No  Hematologic/Lymphatic Swollen glands?: No Easy bruising?: No  Cardiovascular Leg swelling?: No Chest pain?: No  Respiratory Cough?: No Shortness of breath?: No  Endocrine Excessive thirst?: No  Musculoskeletal Back pain?: No Joint pain?: No  Neurological Headaches?: No Dizziness?: No  Psychologic Depression?: No Anxiety?: No  Physical Exam: BP (!) 157/78   Pulse 84   Ht 5\' 8"  (1.727 m)   Wt 217 lb (98.4 kg)   BMI 32.99 kg/m   Constitutional:  Alert and oriented, No acute distress. HEENT: [place] AT, moist mucus membranes.  Trachea midline, no masses. Cardiovascular: No clubbing, cyanosis, or edema. Respiratory: Normal respiratory effort, no increased work of breathing. GI: Abdomen is soft, nontender, nondistended, no abdominal masses.  Obese, incision well healed. Skin: No rashes, bruises or suspicious lesions. Neurologic: Grossly intact, no focal deficits, moving all 4 extremities. Psychiatric: Normal mood and affect.  Laboratory Data: Lab Results  Component Value Date   WBC 11.6 (H) 07/10/2017   HGB 14.1 07/10/2017   HCT 40.8 07/10/2017   MCV 85.6 07/10/2017   PLT 177 07/10/2017    Lab Results  Component Value Date   CREATININE 1.14 07/10/2017    Lab Results  Component Value  Date   PSA1 <0.1 11/05/2017   PSA1 <0.1 08/07/2017   PSA1 0.1 08/03/2017    Urinalysis N/a  Pertinent Imaging: N/a  Assessment & Plan:    1. Prostate cancer ([place]) p T3a N0 PSA is undetectable Repeat PSA (lab only in 3 months) and MD visit in 6 - PSA  2. Stress incontinence, male Reassured, should improve with time Continue to encourage weight loss and pelvic floor therapy  3. Erectile dysfunction after radical prostatectomy Discussed options for treatment and intracavernosal injection Not currently interested in further intervention Encourage penile stretching due to concern for loss of penile elasticity   Return in about 6 months (around 05/07/2018) for MD visit with PSA prior, (also lab visit for PSA in 3 months).  Hollice Espy, MD  [facility] [address] [place], [place] [address] [phone]

## 2018-01-22 NOTE — Progress Notes (Signed)
Cardiology Office Note  Date:  01/23/2018   ID:  Jerry Welch, DOB 10-Dec-1950, MRN [id number]  PCP:  [name], MD   Chief Complaint  Patient presents with  . Other    12 month follow up. Patient denies chest pain and SOB. Meds reviewed verbally with patient.     HPI:  Jerry Welch  is a pleasant 67 year old gentleman with history of obesity,  hypertension,  migraines  osteoarthritis  Smoked for 20 yr, until 1982  prostat cancer , resection 06/2017 who presents for follow up of his hypertension,  and shortness of breath.  Long discussion concerning recent events Prostate cancer development, had prostatectomy laparoscopic Recent colonoscopy, polyp taken out  Denies any significant shortness of breath on exertion, no regular exercise program Weight has been stable Does not check blood pressure at home Losartan HCTZ changed to valsartan HCT  Leg swelling from verapamil Swelling Worse on the left leg than the right, this is been a chronic issue  He denies any chest pain   Lab work reviewed with him in detail Total cholesterol reviewed, 147, LDL 77, hemoglobin A1c 5.7  EKG personally reviewed by myself on todays visit Shows normal sinus rhythm with rate 59 bpm no significant ST or T wave changes  Other past medical history History of severe back pain.  trouble started 30 years ago with a bulge disc.   ultrasound September 2013 showing hepatic steatohepatitis,     Previous lab work by Dr. Kary Kos August 2013 shows total cholesterol 154, LDL 87, HDL 40   PMH:   has a past medical history of Back pain, Cancer [place]), GERD (gastroesophageal reflux disease), History of hiatal hernia, HLD (hyperlipidemia) (04/30/2015), Hypertension, Osteoarthrosis involving multiple sites, Ruptured lumbar disc, and Sleep apnea.  PSH:    Past Surgical History:  Procedure Laterality Date  . APPENDECTOMY    . BACK SURGERY  09/2013   L4-L5   . BASAL CELL CARCINOMA EXCISION  2012    right neck   . PELVIC LYMPH NODE DISSECTION Bilateral 07/09/2017   Procedure: PELVIC LYMPH NODE DISSECTION;  Surgeon: Hollice Espy, MD;  Location: [facility] ORS;  Service: Urology;  Laterality: Bilateral;  . PROSTATE BIOPSY    . 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 REPLACEMENT TOTAL KNEE     right x 2  . ROBOT ASSISTED LAPAROSCOPIC RADICAL PROSTATECTOMY N/A 07/09/2017   Procedure: ROBOTIC ASSISTED LAPAROSCOPIC RADICAL PROSTATECTOMY;  Surgeon: Hollice Espy, MD;  Location: [facility] ORS;  Service: Urology;  Laterality: N/A;    Current Outpatient Medications  Medication Sig Dispense Refill  . cyanocobalamin (,VITAMIN B-12,) 1000 MCG/ML injection Inject 1,000 mcg into the muscle every 14 (fourteen) days.     . naproxen sodium (ANAPROX) 220 MG tablet Take 440 mg by mouth daily.    Marland Kitchen omeprazole (PRILOSEC) 40 MG capsule Take 40 mg by mouth daily.     Marland Kitchen POTASSIUM CHLORIDE ER PO Take 40 mEq by mouth daily.     . valsartan-hydrochlorothiazide (DIOVAN-HCT) 320-25 MG tablet   10  . verapamil (COVERA HS) 240 MG (CO) 24 hr tablet Take 240 mg by mouth 2 (two) times daily.      No current facility-administered medications for this visit.      Allergies:   Amlodipine and Lisinopril   Social History:  The patient  reports that he quit smoking about 36 years ago. His smoking use included cigarettes. He smoked 1.00 pack per day. He has never used  smokeless tobacco. He reports that he drinks about 0.5 oz of alcohol per week. He reports that he does not use drugs.   Family History:   family history includes Heart attack in his maternal grandfather; Heart failure in his mother.    Review of Systems: Review of Systems  Constitutional: Negative.   Respiratory: Positive for cough.   Cardiovascular: Negative.   Gastrointestinal: Negative.   Musculoskeletal: Negative.   Neurological: Negative.   Psychiatric/Behavioral: Negative.   All other systems reviewed and are negative.    PHYSICAL  EXAM: VS:  BP 140/78 (BP Location: Left Arm, Patient Position: Sitting, Cuff Size: Normal)   Pulse (!) 59   Ht 5\' 10"  (1.778 m)   Wt 213 lb (96.6 kg)   BMI 30.56 kg/m  , BMI Body mass index is 30.56 kg/m. Constitutional:  oriented to person, place, and time. No distress.  HENT:  Head: Normocephalic and atraumatic.  Eyes:  no discharge. No scleral icterus.  Neck: Normal range of motion. Neck supple. No JVD present.  Cardiovascular: Normal rate, regular rhythm, normal heart sounds and intact distal pulses. Exam reveals no gallop and no friction rub. No edema No murmur heard. Pulmonary/Chest: Effort normal and breath sounds normal. No stridor. No respiratory distress.  no wheezes.  no rales.  no tenderness.  Abdominal: Soft.  no distension.  no tenderness.  Musculoskeletal: Normal range of motion.  no  tenderness or deformity.  Neurological:  normal muscle tone. Coordination normal. No atrophy Skin: Skin is warm and dry. No rash noted. not diaphoretic.  Psychiatric:  normal mood and affect. behavior is normal. Thought content normal.     Recent Labs: 07/10/2017: BUN 15; Creatinine, Ser 1.14; Hemoglobin 14.1; Platelets 177; Potassium 3.5; Sodium 137    Lipid Panel No results found for: CHOL, HDL, LDLCALC, TRIG    Wt Readings from Last 3 Encounters:  01/23/18 213 lb (96.6 kg)  11/07/17 217 lb (98.4 kg)  08/07/17 218 lb 9.6 oz (99.2 kg)       ASSESSMENT AND PLAN:  Essential hypertension Blood pressure is well controlled on today's visit. No changes made to the medications. Stable  Mixed hyperlipidemia Numbers well controlled on no statin Discussed risk stratification studies such as CT coronary calcium scoring He will call us if he would like to order this  Leg swelling He prefers to stay on verapamil high-dose twice a day Recommended compression hose  Class 2 obesity due to excess calories without serious comorbidity with body mass index (BMI) of 35.0 to 35.9 in  adult We have encouraged continued exercise, careful diet management in an effort to lose weight. Weight has been stable   Total encounter time more than 25 minutes  Greater than 50% was spent in counseling and coordination of care with the patient   Disposition:   F/U  12 months as needed   Orders Placed This Encounter  Procedures  . EKG 12-Lead     Signed, Esmond Plants, M.D., Ph.D. 01/23/2018  [place], [place]

## 2018-01-23 ENCOUNTER — Encounter: Payer: Self-pay | Admitting: Cardiovascular Disease

## 2018-01-23 ENCOUNTER — Ambulatory Visit (INDEPENDENT_AMBULATORY_CARE_PROVIDER_SITE_OTHER): Payer: BLUE CROSS/BLUE SHIELD | Admitting: Cardiovascular Disease

## 2018-01-23 VITALS — BP 140/78 | HR 59 | Ht 70.0 in | Wt 213.0 lb

## 2018-01-23 DIAGNOSIS — M7989 Other specified soft tissue disorders: Secondary | ICD-10-CM | POA: Diagnosis not present

## 2018-01-23 DIAGNOSIS — R0602 Shortness of breath: Secondary | ICD-10-CM

## 2018-01-23 DIAGNOSIS — I10 Essential (primary) hypertension: Secondary | ICD-10-CM | POA: Diagnosis not present

## 2018-01-23 DIAGNOSIS — C61 Malignant neoplasm of prostate: Secondary | ICD-10-CM

## 2018-01-23 DIAGNOSIS — E782 Mixed hyperlipidemia: Secondary | ICD-10-CM

## 2018-01-23 NOTE — Patient Instructions (Addendum)
Medication Instructions:   No medication changes made  Try cetirazine/generic for zyrtec Try 1/2 up to a whole  Labwork:  No new labs needed  Testing/Procedures:  No further testing at this time  Please call if youi every want a CT coronary calcium score  $150   Follow-Up: It was a pleasure seeing you in the office today. Please call us if you have new issues that need to be addressed before your next appt.  [phone]  Your physician wants you to follow-up in: 12 months  as needed You will receive a reminder letter in the mail two months in advance. If you don't receive a letter, please call our office to schedule the follow-up appointment.  If you need a refill on your cardiac medications before your next appointment, please call your pharmacy.  For educational health videos Log in to : [email or web address] Or : [email or web address], password : [email or web address]

## 2018-02-05 ENCOUNTER — Other Ambulatory Visit: Payer: Self-pay | Admitting: Family Medicine

## 2018-02-05 DIAGNOSIS — C61 Malignant neoplasm of prostate: Secondary | ICD-10-CM

## 2018-02-06 ENCOUNTER — Other Ambulatory Visit: Payer: Medicare Other

## 2018-02-06 DIAGNOSIS — C61 Malignant neoplasm of prostate: Secondary | ICD-10-CM

## 2018-02-07 ENCOUNTER — Telehealth: Payer: Self-pay

## 2018-02-07 LAB — PSA

## 2018-02-07 NOTE — Telephone Encounter (Signed)
-----   Message from Hollice Espy, MD sent at 02/07/2018  8:44 AM EDT ----- PSA remains undetectable.  Great news.

## 2018-05-06 ENCOUNTER — Other Ambulatory Visit: Payer: Medicare Other

## 2018-05-06 ENCOUNTER — Other Ambulatory Visit: Payer: Self-pay

## 2018-05-06 DIAGNOSIS — C61 Malignant neoplasm of prostate: Secondary | ICD-10-CM

## 2018-05-07 LAB — PSA: Prostate Specific Ag, Serum: <0.1 ng/mL (ref 0.0–4.0)

## 2018-05-08 ENCOUNTER — Ambulatory Visit: Payer: Medicare Other | Admitting: Urology

## 2018-05-08 ENCOUNTER — Encounter: Payer: Self-pay | Admitting: Urology

## 2018-06-25 ENCOUNTER — Encounter: Payer: Self-pay | Admitting: Urology

## 2018-06-25 ENCOUNTER — Ambulatory Visit (INDEPENDENT_AMBULATORY_CARE_PROVIDER_SITE_OTHER): Payer: Medicare Other | Admitting: Urology

## 2018-06-25 VITALS — BP 134/84 | HR 79 | Resp 16 | Ht 70.0 in | Wt 200.3 lb

## 2018-06-25 DIAGNOSIS — N5231 Erectile dysfunction following radical prostatectomy: Secondary | ICD-10-CM

## 2018-06-25 DIAGNOSIS — N393 Stress incontinence (female) (male): Secondary | ICD-10-CM

## 2018-06-25 DIAGNOSIS — C61 Malignant neoplasm of prostate: Secondary | ICD-10-CM | POA: Diagnosis not present

## 2018-06-25 NOTE — Progress Notes (Signed)
06/25/2018 1:34 PM   Jerry Welch 24-Dec-1950 [id number]  Referring provider: [name], MD [address] [facility] [place], [place] [address]  Chief Complaint  Patient presents with  . Prostate Cancer    6 month    HPI: 67 year old male 1 year status post robotic radical prostatectomy with bilateral pelvic lymph node dissection.  He returns today for routine follow-up.  Surgical pathology consistent with Gleason 3+4.  There was evidence of extraprostatic extension.  Negative margins.  Negative lymph nodes.  Negative seminal vesicles. pT3a N0.  PSA remains undetectable as of 05/06/2018.    In terms of continence,  he reports that is is minimal.   He leaks on a tiny amount with strenuous activity.  He wears a safety pad.  This not bothered by this.    He has not had any erections.  He has tried unsuccessfully.  He is not interested in any further treatment.  This is unchanged.   PMH: Past Medical History:  Diagnosis Date  . Back pain   . Cancer [facility])    prostate  . GERD (gastroesophageal reflux disease)   . History of hiatal hernia   . HLD (hyperlipidemia) 04/30/2015  . Hypertension   . Osteoarthrosis involving multiple sites   . Ruptured lumbar disc    L4 & L5  . Sleep apnea     Surgical History: Past Surgical History:  Procedure Laterality Date  . APPENDECTOMY    . BACK SURGERY  09/2013   L4-L5   . BASAL CELL CARCINOMA EXCISION  2012   right neck   . PELVIC LYMPH NODE DISSECTION Bilateral 07/09/2017   Procedure: PELVIC LYMPH NODE DISSECTION;  Surgeon: Hollice Espy, MD;  Location: [facility] ORS;  Service: Urology;  Laterality: Bilateral;  . PROSTATE BIOPSY    . 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 REPLACEMENT TOTAL KNEE     right x 2  . ROBOT ASSISTED LAPAROSCOPIC RADICAL PROSTATECTOMY N/A 07/09/2017   Procedure: ROBOTIC ASSISTED LAPAROSCOPIC RADICAL PROSTATECTOMY;  Surgeon: Hollice Espy, MD;  Location: [facility] ORS;  Service:  Urology;  Laterality: N/A;    Home Medications:  Allergies as of 06/25/2018      Reactions   Amlodipine Other (See Comments)   LE EDEMA   Lisinopril    Cough-onset      Medication List        Accurate as of 06/25/18  1:34 PM. Always use your most recent med list.          cyanocobalamin 1000 MCG/ML injection Commonly known as:  (VITAMIN B-12) Inject 1,000 mcg into the muscle every 14 (fourteen) days.   naproxen sodium 220 MG tablet Commonly known as:  ALEVE Take 440 mg by mouth daily.   omeprazole 40 MG capsule Commonly known as:  PRILOSEC Take 40 mg by mouth daily.   POTASSIUM CHLORIDE ER PO Take 40 mEq by mouth daily.   valsartan-hydrochlorothiazide 320-25 MG tablet Commonly known as:  DIOVAN-HCT   verapamil 240 MG (CO) 24 hr tablet Commonly known as:  COVERA HS Take 240 mg by mouth 2 (two) times daily.       Allergies:  Allergies  Allergen Reactions  . Amlodipine Other (See Comments)    LE EDEMA  . Lisinopril     Cough-onset     Family History: Family History  Problem Relation Age of Onset  . Heart failure Mother   . Heart attack Maternal Grandfather   .  Bladder Cancer Neg Hx   . Prostate cancer Neg Hx   . Kidney cancer Neg Hx     Social History:  reports that he quit smoking about 37 years ago. His smoking use included cigarettes. He smoked 1.00 pack per day. He has quit using smokeless tobacco. He reports that he drinks about 1.0 standard drinks of alcohol per week. He reports that he does not use drugs.  ROS: UROLOGY Frequent Urination?: No Hard to postpone urination?: No Burning/pain with urination?: Yes Get up at night to urinate?: Yes Leakage of urine?: No Urine stream starts and stops?: No Trouble starting stream?: No Do you have to strain to urinate?: No Blood in urine?: No Urinary tract infection?: No Sexually transmitted disease?: No Injury to kidneys or bladder?: No Painful intercourse?: No Weak stream?: No Erection  problems?: No Penile pain?: No  Gastrointestinal Nausea?: No Vomiting?: No Indigestion/heartburn?: No Diarrhea?: No Constipation?: No  Constitutional Fever: No Night sweats?: No Weight loss?: No Fatigue?: No  Skin Skin rash/lesions?: No Itching?: No  Eyes Blurred vision?: No Double vision?: No  Ears/Nose/Throat Sore throat?: No Sinus problems?: No  Hematologic/Lymphatic Swollen glands?: No Easy bruising?: No  Cardiovascular Leg swelling?: No Chest pain?: No  Respiratory Cough?: No Shortness of breath?: No  Endocrine Excessive thirst?: No  Musculoskeletal Back pain?: No Joint pain?: No  Neurological Headaches?: No Dizziness?: No  Psychologic Depression?: No Anxiety?: No  Physical Exam: BP 134/84   Pulse 79   Resp 16   Ht 5\' 10"  (1.778 m)   Wt 200 lb 4.8 oz (90.9 kg)   BMI 28.74 kg/m   Constitutional:  Alert and oriented, No acute distress. HEENT: [place] AT, moist mucus membranes.  Trachea midline, no masses. Cardiovascular: No clubbing, cyanosis, or edema. Respiratory: Normal respiratory effort, no increased work of breathing. Skin: No rashes, bruises or suspicious lesions. Neurologic: Grossly intact, no focal deficits, moving all 4 extremities. Psychiatric: Normal mood and affect.  Laboratory Data: Lab Results  Component Value Date   WBC 11.6 (H) 07/10/2017   HGB 14.1 07/10/2017   HCT 40.8 07/10/2017   MCV 85.6 07/10/2017   PLT 177 07/10/2017    Lab Results  Component Value Date   CREATININE 1.14 07/10/2017   PSA as above  Urinalysis N/a  Pertinent Imaging: n/a  Assessment & Plan:    1. Prostate cancer ([place]) p T3a N0 s/p prostatectomy 06/2017 PSA is undetectable PSA in 6 months by PCP with annual labs (q6 months), advised to return is PSA is not detectable RTC in 1 year  2. Erectile dysfunction after radical prostatectomy Severe, not interested in treatment   3. Stress incontinence, male Minimal without  bother Encouraged continued Kegels, lifelong  Return in about 1 year (around 06/26/2019) for [facility] with PSA prior .  Hollice Espy, MD  [facility] [address], [place], [place] [address] [phone]

## 2019-06-23 ENCOUNTER — Other Ambulatory Visit: Payer: Self-pay | Admitting: *Deleted

## 2019-06-23 DIAGNOSIS — C61 Malignant neoplasm of prostate: Secondary | ICD-10-CM

## 2019-06-24 ENCOUNTER — Other Ambulatory Visit: Payer: Self-pay

## 2019-06-24 ENCOUNTER — Other Ambulatory Visit: Payer: Medicare Other

## 2019-06-24 DIAGNOSIS — C61 Malignant neoplasm of prostate: Secondary | ICD-10-CM

## 2019-06-25 LAB — PSA: Prostate Specific Ag, Serum: <0.1 ng/mL (ref 0.0–4.0)

## 2019-06-26 NOTE — Progress Notes (Addendum)
06/27/2019 7:54 AM   Jerry Welch Feb 13, 1951 [id number]  Referring provider: [name], MD [address] [facility] [place],  [place] [address]  Chief Complaint  Patient presents with  . Prostate Cancer    HPI: 68 year old male 2 year status post robotic radical prostatectomy with bilateral pelvic lymph node dissection.  He returns today for routine follow-up.  Surgical pathology consistent with Gleason 3+4.  There was evidence of extraprostatic extension.  Negative margins.  Negative lymph nodes.  Negative seminal vesicles. pT3a N0.  PSA remains undetectable as of 06/24/2019.    In terms of continence,  he reports that is is minimal.   He leaks on a tiny amount with strenuous activity.  He wears a safety pad.  This not bothered by this.    He did mention that he had an episode of gross hematuria that happened one month ago.    He has not had any erections since his prostate surgery on 07/09/2017.  He has tried unsuccessfully.  He is not interested in any further treatment.  This is unchanged.   PMH: Past Medical History:  Diagnosis Date  . Back pain   . Cancer [facility])    prostate  . GERD (gastroesophageal reflux disease)   . History of hiatal hernia   . HLD (hyperlipidemia) 04/30/2015  . Hypertension   . Osteoarthrosis involving multiple sites   . Ruptured lumbar disc    L4 & L5  . Sleep apnea     Surgical History: Past Surgical History:  Procedure Laterality Date  . APPENDECTOMY    . BACK SURGERY  09/2013   L4-L5   . BASAL CELL CARCINOMA EXCISION  2012   right neck   . PELVIC LYMPH NODE DISSECTION Bilateral 07/09/2017   Procedure: PELVIC LYMPH NODE DISSECTION;  Surgeon: Hollice Espy, MD;  Location: [facility] ORS;  Service: Urology;  Laterality: Bilateral;  . PROSTATE BIOPSY    . 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 REPLACEMENT TOTAL KNEE     right x 2  . ROBOT ASSISTED LAPAROSCOPIC RADICAL PROSTATECTOMY N/A 07/09/2017   Procedure: ROBOTIC ASSISTED LAPAROSCOPIC RADICAL PROSTATECTOMY;  Surgeon: Hollice Espy, MD;  Location: [facility] ORS;  Service: Urology;  Laterality: N/A;    Home Medications:  Allergies as of 06/27/2019      Reactions   Amlodipine Other (See Comments)   LE EDEMA   Lisinopril    Cough-onset      Medication List       Accurate as of June 27, 2019 11:59 PM. If you have any questions, ask your nurse or doctor.        STOP taking these medications   potassium chloride 10 MEQ tablet Commonly known as: KLOR-CON Stopped by: Zara Council, PA-C     TAKE these medications   cyanocobalamin 1000 MCG/ML injection Commonly known as: (VITAMIN B-12) Inject 1,000 mcg into the muscle every 14 (fourteen) days.   naproxen sodium 220 MG tablet Commonly known as: ALEVE Take 440 mg by mouth daily.   omeprazole 40 MG capsule Commonly known as: PRILOSEC Take 40 mg by mouth daily.   POTASSIUM CHLORIDE ER PO Take 40 mEq by mouth daily.   valsartan-hydrochlorothiazide 320-25 MG tablet Commonly known as: DIOVAN-HCT   verapamil 240 MG (CO) 24 hr tablet Commonly known as: COVERA HS Take 240 mg by mouth 2 (two) times daily.       Allergies:  Allergies  Allergen Reactions  . Amlodipine  Other (See Comments)    LE EDEMA  . Lisinopril     Cough-onset     Family History: Family History  Problem Relation Age of Onset  . Heart failure Mother   . Heart attack Maternal Grandfather   . Bladder Cancer Neg Hx   . Prostate cancer Neg Hx   . Kidney cancer Neg Hx     Social History:  reports that he quit smoking about 38 years ago. His smoking use included cigarettes. He smoked 1.00 pack per day. He has quit using smokeless tobacco. He reports current alcohol use of about 1.0 standard drinks of alcohol per week. He reports that he does not use drugs.  ROS: UROLOGY Frequent Urination?: No Hard to postpone urination?: No Burning/pain with urination?: No Get up at night to urinate?: No  Leakage of urine?: No Urine stream starts and stops?: No Trouble starting stream?: No Do you have to strain to urinate?: No Blood in urine?: No Urinary tract infection?: No Sexually transmitted disease?: No Injury to kidneys or bladder?: No Painful intercourse?: No Weak stream?: No Erection problems?: No Penile pain?: No  Gastrointestinal Nausea?: No Vomiting?: No Indigestion/heartburn?: No Diarrhea?: No Constipation?: No  Constitutional Fever: No Night sweats?: No Weight loss?: No Fatigue?: No  Skin Skin rash/lesions?: No Itching?: No  Eyes Blurred vision?: No Double vision?: No  Ears/Nose/Throat Sore throat?: No Sinus problems?: No  Hematologic/Lymphatic Swollen glands?: No Easy bruising?: No  Cardiovascular Leg swelling?: No Chest pain?: No  Respiratory Cough?: No Shortness of breath?: No  Endocrine Excessive thirst?: No  Musculoskeletal Back pain?: No Joint pain?: No  Neurological Headaches?: No Dizziness?: No  Psychologic Depression?: No Anxiety?: No  Physical Exam: BP (!) 172/102 (BP Location: Left Arm, Patient Position: Sitting, Cuff Size: Normal)   Pulse 60   Ht 5\' 10"  (1.778 m)   Wt 209 lb (94.8 kg)   BMI 29.99 kg/m   Constitutional:  Well nourished. Alert and oriented, No acute distress. HEENT: [place] AT, moist mucus membranes.  Trachea midline, no masses. Cardiovascular: No clubbing, cyanosis, or edema. Respiratory: Normal respiratory effort, no increased work of breathing. Neurologic: Grossly intact, no focal deficits, moving all 4 extremities. Psychiatric: Normal mood and affect.  Laboratory Data: Lab Results  Component Value Date   WBC 11.6 (H) 07/10/2017   HGB 14.1 07/10/2017   HCT 40.8 07/10/2017   MCV 85.6 07/10/2017   PLT 177 07/10/2017    Lab Results  Component Value Date   CREATININE 1.14 07/10/2017   PSA as above  Urinalysis N/a  Pertinent Imaging: n/a  Assessment & Plan:    1. Prostate cancer  ([place]) p T3a N0 s/p prostatectomy 06/2017 PSA is undetectable PSA in 6 months by PCP with annual labs (q6 months), advised to return is PSA is not detectable RTC in 1 year  2. Erectile dysfunction after radical prostatectomy Severe, not interested in treatment   3. Stress incontinence, male Minimal without bother Encouraged continued Kegels, lifelong  4. Gross hematuria UA today negative  I explained to the patient that there are a number of causes that can be associated with blood in the urine, such as stones, UTI's, damage to the urinary tract and/or cancer. At this time, I felt that the patient warranted further urologic evaluation. The AUA guidelines state that a CT urogram is the preferred imaging study to evaluate hematuria. I explained to the patient that a contrast material will be injected into a vein and that in rare instances, an  allergic reaction can result and may even life threatening (1:100,000)  The patient denies any allergies to contrast, iodine and/or seafood and is not taking metformin. Following the imaging study,  I've recommended a cystoscopy. I described how this is performed, typically in an office setting with a flexible cystoscope. We described the risks, benefits, and possible side effects, the most common of which is a minor amount of blood in the urine and/or burning which usually resolves in 24 to 48 hours.   The patient had the opportunity to ask questions which were answered. Based upon this discussion, the patient is willing to proceed. Therefore, I've ordered: a CT Urogram and cystoscopy. The patient will return following all of the above for discussion of the results.  UA     Return for CT Urogram report and cystoscopy.  Zara Council, PA-C  [facility] [address] [place], [place] [address] [phone]

## 2019-06-27 ENCOUNTER — Ambulatory Visit (INDEPENDENT_AMBULATORY_CARE_PROVIDER_SITE_OTHER): Payer: Medicare Other | Admitting: Urology

## 2019-06-27 ENCOUNTER — Encounter: Payer: Self-pay | Admitting: Urology

## 2019-06-27 ENCOUNTER — Other Ambulatory Visit: Payer: Self-pay

## 2019-06-27 VITALS — BP 172/102 | HR 60 | Ht 70.0 in | Wt 209.0 lb

## 2019-06-27 DIAGNOSIS — R310 Gross hematuria: Secondary | ICD-10-CM | POA: Diagnosis not present

## 2019-06-27 DIAGNOSIS — N393 Stress incontinence (female) (male): Secondary | ICD-10-CM

## 2019-06-27 DIAGNOSIS — N5231 Erectile dysfunction following radical prostatectomy: Secondary | ICD-10-CM

## 2019-06-27 DIAGNOSIS — C61 Malignant neoplasm of prostate: Secondary | ICD-10-CM

## 2019-06-27 LAB — URINALYSIS, COMPLETE
Bilirubin, UA: NEGATIVE
Glucose, UA: NEGATIVE
Ketones, UA: NEGATIVE
Leukocytes,UA: NEGATIVE
Nitrite, UA: NEGATIVE
Protein,UA: NEGATIVE
RBC, UA: NEGATIVE
Specific Gravity, UA: 1.025 (ref 1.005–1.030)
Urobilinogen, Ur: 2 mg/dL — ABNORMAL HIGH (ref 0.2–1.0)
pH, UA: 7.5 (ref 5.0–7.5)

## 2019-06-27 LAB — MICROSCOPIC EXAMINATION
Bacteria, UA: NONE SEEN
RBC: NONE SEEN /hpf (ref 0–2)
WBC, UA: NONE SEEN /hpf (ref 0–5)

## 2019-06-27 NOTE — Addendum Note (Signed)
Addended by: Zara Council A on: 06/27/2019 12:15 PM   Modules accepted: Orders

## 2019-06-30 ENCOUNTER — Other Ambulatory Visit: Payer: Self-pay | Admitting: Urology

## 2019-06-30 DIAGNOSIS — R310 Gross hematuria: Secondary | ICD-10-CM

## 2019-07-29 ENCOUNTER — Telehealth: Payer: Self-pay | Admitting: Urology

## 2019-07-29 NOTE — Telephone Encounter (Signed)
Pt called back and states that he does not want to have these test done.

## 2019-07-29 NOTE — Telephone Encounter (Signed)
LMOM for pt to call back to schedule CT and Cysto per [facility].

## 2019-10-02 ENCOUNTER — Telehealth: Payer: Self-pay | Admitting: Urology

## 2019-10-02 NOTE — Telephone Encounter (Signed)
LMOM for patient to return call.

## 2019-10-02 NOTE — Telephone Encounter (Signed)
Would you check with Jerry Welch to see if he was able to get into the [place], so that he may have his CT and cysto for his hematuria workup?

## 2019-10-03 ENCOUNTER — Other Ambulatory Visit: Payer: Self-pay | Admitting: Urology

## 2019-10-03 DIAGNOSIS — R310 Gross hematuria: Secondary | ICD-10-CM

## 2019-10-03 NOTE — Telephone Encounter (Signed)
Spoke to patient and the [place] states they will not allow the workup. They told him that because it is not a cancer they won't cover it.

## 2019-10-03 NOTE — Progress Notes (Unsigned)
Please schedule Jerry Welch for a CTU report and cystoscopy.  He has seen Dr. Erlene Quan in the past.

## 2019-10-03 NOTE — Telephone Encounter (Signed)
We can definitely perform the work up for hematuria for him here.

## 2019-10-03 NOTE — Telephone Encounter (Signed)
Patient states he wants to do the CT and Cysto. He inquired with his insurance and they will be covered.

## 2019-10-13 NOTE — Telephone Encounter (Signed)
I see where Jerry Welch has his CT scan scheduled, but he still needs to be scheduled for a cystoscopy with Dr. Erlene Quan.

## 2019-10-13 NOTE — Telephone Encounter (Signed)
[facility] for patient to call back and schedule Cysto with Dr. Erlene Quan after 10/17/19.

## 2019-10-16 NOTE — Telephone Encounter (Signed)
Appointment has been scheduled.

## 2019-10-17 ENCOUNTER — Ambulatory Visit
Admission: RE | Admit: 2019-10-17 | Discharge: 2019-10-17 | Disposition: A | Discharge status: Home / Self Care | Payer: Medicare Other | Source: Ambulatory Visit | Attending: Urology | Admitting: Urology

## 2019-10-17 ENCOUNTER — Other Ambulatory Visit: Payer: Self-pay

## 2019-10-17 DIAGNOSIS — R310 Gross hematuria: Secondary | ICD-10-CM | POA: Insufficient documentation

## 2019-10-17 LAB — POCT I-STAT CREATININE: Creatinine, Ser: 1.4 mg/dL — ABNORMAL HIGH (ref 0.61–1.24)

## 2019-10-17 MED ORDER — IOHEXOL 300 MG/ML  SOLN
125.0000 mL | Freq: Once | INTRAMUSCULAR | Status: AC | PRN
  Administered 2019-10-17: 13:00:00 125 mL via INTRAVENOUS

## 2019-10-21 ENCOUNTER — Encounter: Payer: Self-pay | Admitting: Urology

## 2019-10-21 ENCOUNTER — Ambulatory Visit (INDEPENDENT_AMBULATORY_CARE_PROVIDER_SITE_OTHER): Payer: Medicare Other | Admitting: Urology

## 2019-10-21 ENCOUNTER — Other Ambulatory Visit: Payer: Self-pay

## 2019-10-21 VITALS — BP 142/86 | HR 88 | Ht 70.0 in | Wt 209.0 lb

## 2019-10-21 DIAGNOSIS — Q644 Malformation of urachus: Secondary | ICD-10-CM

## 2019-10-21 DIAGNOSIS — R310 Gross hematuria: Secondary | ICD-10-CM

## 2019-10-21 DIAGNOSIS — C61 Malignant neoplasm of prostate: Secondary | ICD-10-CM

## 2019-10-21 NOTE — Progress Notes (Signed)
   10/24/19  CC:  Chief Complaint  Patient presents with  . Cysto    HPI: 69 year old male with personal history of prostate cancer status post robotic prostatectomy without recurrence who presents today for further evaluation of an episode of painless gross hematuria.  Please see notes for previous details.  In the interim, he is undergone CT urogram on 10/07/2019 which did in fact demonstrate a fluid collection measuring 5.6 x 3.3 in the space of Retzius extending to the umbilicus.  An incidental 3 mm left renal calculus was also identified.  No other GU pathology was appreciated, CT scan was personally reviewed today.  Blood pressure (!) 142/86, pulse 88, height 5\' 10"  (1.778 m), weight 209 lb (94.8 kg). NED. A&Ox3.   No respiratory distress   Abd soft, NT, ND Normal phallus with bilateral descended testicles  Cystoscopy Procedure Note  Patient identification was confirmed, informed consent was obtained, and patient was prepped using Betadine solution.  Lidocaine jelly was administered per urethral meatus.     Pre-Procedure: - Inspection reveals a normal caliber ureteral meatus.  Procedure: The flexible cystoscope was introduced without difficulty - No urethral strictures/lesions are present. - Surgically absent prostate with widely patent anastomosis - Normal bladder neck - Bilateral ureteral orifices identified - Bladder mucosa  reveals no ulcers, tumors, or lesions - No bladder stones - No trabeculation  Retroflexion shows absent prostate  Post-Procedure: - Patient tolerated the procedure well  Assessment/ Plan:  1. Gross hematuria Status post evaluation including CT urogram and cystoscopy.  Incidental 3 mm kidney stone, asymptomatic this will follow clinically.  See #2  Cystoscopic findings today unremarkable which is reassuring.  Advised to return sooner if symptoms recur - Urinalysis, Complete  2. Congenital urachal cyst No previous cross-sectional  imaging for comparison  Reviewed operative report which did in fact indicate that the patient had dense adhesions to the anterior abdominal wall and in the space of Retzius.  In retrospect, suspect the urachal cyst was present at the time of surgery which accounts for the dense adhesions encountered at this location.  He is asymptomatic from this thus would not recommend any further intervention.  3. Prostate cancer ([place]) No evidence of disease, will continue every 6 month PSA surveillance   Return for PSA lab only in 2 months, PSA with PA in 8 months.  Hollice Espy, MD

## 2019-10-22 LAB — MICROSCOPIC EXAMINATION
Bacteria, UA: NONE SEEN
RBC, Urine: NONE SEEN /hpf (ref 0–2)

## 2019-10-22 LAB — URINALYSIS, COMPLETE
Bilirubin, UA: NEGATIVE
Glucose, UA: NEGATIVE
Ketones, UA: NEGATIVE
Leukocytes,UA: NEGATIVE
Nitrite, UA: NEGATIVE
Protein,UA: NEGATIVE
RBC, UA: NEGATIVE
Specific Gravity, UA: 1.025 (ref 1.005–1.030)
Urobilinogen, Ur: 1 mg/dL (ref 0.2–1.0)
pH, UA: 6 (ref 5.0–7.5)

## 2019-10-30 ENCOUNTER — Ambulatory Visit: Payer: BLUE CROSS/BLUE SHIELD | Attending: Internal Medicine

## 2019-10-30 DIAGNOSIS — Z20822 Contact with and (suspected) exposure to covid-19: Secondary | ICD-10-CM

## 2019-10-31 LAB — NOVEL CORONAVIRUS, NAA: SARS-CoV-2, NAA: NOT DETECTED

## 2019-12-02 NOTE — Progress Notes (Signed)
Cardiology Office Note  Date:  12/03/2019   ID:  Jerry Welch, DOB [birth year], MRN [id number]  PCP:  Jerry Pink, MD   Chief Complaint  Patient presents with  . other    12 month follow up. Meds reviewed by the pt. verbally. "doing well."     HPI:  Jerry Welch  is a pleasant 69 year old gentleman with history of obesity,  hypertension,  migraines  osteoarthritis  Smoked for 20 yr, until 1982  prostat cancer , resection 06/2017 who presents for follow up of his hypertension,  and shortness of breath.  Feels well, Rare orthostasis  Chronic leg swelling, does not wear compression hose Denies shortness of breath or chest pain  Retired, Works on cars  Reports blood pressure well controlled at home High-dose verapamil, valsartan HCTZ,  No regular exercise program Weight stable  Prostate cancer development, had prostatectomy laparoscopic  Based on prior clinic evaluation, Leg swelling felt to be from verapamil Swelling Worse on the left leg than the right, this is been a chronic issue  Lab work reviewed with him in detail Total cholesterol reviewed, 168, LDL 104,  Old hemoglobin A1c 5.7 from 2016  EKG personally reviewed by myself on todays visit Shows normal sinus rhythm with rate 75 bpm no significant ST or T wave changes  Other past medical history History of severe back pain.  trouble started 30 years ago with a bulge disc.   ultrasound September 2013 showing hepatic steatohepatitis,       PMH:   has a past medical history of Back pain, GERD (gastroesophageal reflux disease), History of hiatal hernia, HLD (hyperlipidemia) (04/30/2015), Hypertension, Osteoarthrosis involving multiple sites, Prostate cancer ([place]) (2019), Ruptured lumbar disc, and Sleep apnea.  PSH:    Past Surgical History:  Procedure Laterality Date  . APPENDECTOMY    . BACK SURGERY  09/2013   L4-L5   . BASAL CELL CARCINOMA EXCISION  2012   right neck   . PELVIC LYMPH NODE  DISSECTION Bilateral 07/09/2017   Procedure: PELVIC LYMPH NODE DISSECTION;  Surgeon: Hollice Espy, MD;  Location: [facility] ORS;  Service: Urology;  Laterality: Bilateral;  . PROSTATE BIOPSY    . 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 REPLACEMENT TOTAL KNEE     right x 2  . ROBOT ASSISTED LAPAROSCOPIC RADICAL PROSTATECTOMY N/A 07/09/2017   Procedure: ROBOTIC ASSISTED LAPAROSCOPIC RADICAL PROSTATECTOMY;  Surgeon: Hollice Espy, MD;  Location: [facility] ORS;  Service: Urology;  Laterality: N/A;    Current Outpatient Medications  Medication Sig Dispense Refill  . cyanocobalamin (,VITAMIN B-12,) 1000 MCG/ML injection Inject 1,000 mcg into the muscle every 14 (fourteen) days.     . naproxen sodium (ANAPROX) 220 MG tablet Take 440 mg by mouth daily.    Marland Kitchen omeprazole (PRILOSEC) 40 MG capsule Take 40 mg by mouth daily.     Marland Kitchen POTASSIUM CHLORIDE ER PO Take 40 mEq by mouth daily.     . valsartan-hydrochlorothiazide (DIOVAN-HCT) 320-25 MG tablet   10  . verapamil (COVERA HS) 240 MG (CO) 24 hr tablet Take 240 mg by mouth 2 (two) times daily.      No current facility-administered medications for this visit.     Allergies:   Amlodipine and Lisinopril   Social History:  The patient  reports that he quit smoking about 38 years ago. His smoking use included cigarettes. He smoked 1.00 pack per day. He has quit using smokeless tobacco. He reports current alcohol use of  about 1.0 standard drinks of alcohol per week. He reports that he does not use drugs.   Family History:   family history includes Heart attack in his maternal grandfather; Heart failure in his mother.    Review of Systems: Review of Systems  Constitutional: Negative.   HENT: Negative.   Respiratory: Negative.   Cardiovascular: Negative.   Gastrointestinal: Negative.   Musculoskeletal: Negative.   Neurological: Negative.   Psychiatric/Behavioral: Negative.   All other systems reviewed and are negative.   PHYSICAL EXAM: VS:  BP  132/80 (BP Location: Left Arm, Patient Position: Sitting, Cuff Size: Normal)   Pulse 75   Ht 5\' 9"  (1.753 m)   Wt 212 lb (96.2 kg)   SpO2 98%   BMI 31.31 kg/m  , BMI Body mass index is 31.31 kg/m. Constitutional:  oriented to person, place, and time. No distress.  HENT:  Head: Grossly normal Eyes:  no discharge. No scleral icterus.  Neck: No JVD, no carotid bruits  Cardiovascular: Regular rate and rhythm, no murmurs appreciated Pulmonary/Chest: Clear to auscultation bilaterally, no wheezes or rails Abdominal: Soft.  no distension.  no tenderness.  Musculoskeletal: Normal range of motion Neurological:  normal muscle tone. Coordination normal. No atrophy Skin: Skin warm and dry Psychiatric: normal affect, pleasant  Recent Labs: 10/17/2019: Creatinine, Ser 1.40    Lipid Panel No results found for: CHOL, HDL, LDLCALC, TRIG    Wt Readings from Last 3 Encounters:  12/03/19 212 lb (96.2 kg)  10/21/19 209 lb (94.8 kg)  06/27/19 209 lb (94.8 kg)     ASSESSMENT AND PLAN:  Essential hypertension Options discussed concerning his lower extremity edema Could decrease verapamil, or continue current medications and wear compression hose --He prefers to leave medications as they are, he will wear compression hose  Mixed hyperlipidemia Numbers well controlled on no statin Previously discussed CT coronary calcium scoring No further work-up at this time  Leg swelling Discussed on today's visit and previously discussed He will wear compression hose, prefers to continue high-dose verapamil  Class 2 obesity due to excess calories without serious comorbidity with body mass index (BMI) of 35.0 to 35.9 in adult We have encouraged continued exercise, careful diet management     Total encounter time more than 25 minutes  Greater than 50% was spent in counseling and coordination of care with the patient   Disposition:   F/U 12 months   No orders of the defined types were placed in  this encounter.    Signed, Esmond Plants, M.D., Ph.D. 12/03/2019  [place], [place]

## 2019-12-03 ENCOUNTER — Other Ambulatory Visit: Payer: Self-pay

## 2019-12-03 ENCOUNTER — Encounter: Payer: Self-pay | Admitting: Cardiovascular Disease

## 2019-12-03 ENCOUNTER — Ambulatory Visit (INDEPENDENT_AMBULATORY_CARE_PROVIDER_SITE_OTHER): Payer: Medicare Other | Admitting: Cardiovascular Disease

## 2019-12-03 VITALS — BP 132/80 | HR 75 | Ht 69.0 in | Wt 212.0 lb

## 2019-12-03 DIAGNOSIS — I10 Essential (primary) hypertension: Secondary | ICD-10-CM

## 2019-12-03 DIAGNOSIS — E782 Mixed hyperlipidemia: Secondary | ICD-10-CM

## 2019-12-03 DIAGNOSIS — M7989 Other specified soft tissue disorders: Secondary | ICD-10-CM

## 2019-12-03 DIAGNOSIS — R0602 Shortness of breath: Secondary | ICD-10-CM | POA: Diagnosis not present

## 2019-12-03 NOTE — Patient Instructions (Signed)

## 2019-12-26 ENCOUNTER — Other Ambulatory Visit: Payer: Medicare Other

## 2020-01-05 ENCOUNTER — Other Ambulatory Visit: Payer: Self-pay | Admitting: *Deleted

## 2020-01-05 DIAGNOSIS — C61 Malignant neoplasm of prostate: Secondary | ICD-10-CM

## 2020-01-06 ENCOUNTER — Other Ambulatory Visit: Payer: Medicare Other

## 2020-01-06 ENCOUNTER — Other Ambulatory Visit: Payer: Self-pay

## 2020-01-06 DIAGNOSIS — C61 Malignant neoplasm of prostate: Secondary | ICD-10-CM

## 2020-01-07 LAB — PSA: Prostate Specific Ag, Serum: <0.1 ng/mL (ref 0.0–4.0)

## 2020-01-08 ENCOUNTER — Telehealth: Payer: Self-pay | Admitting: *Deleted

## 2020-01-08 NOTE — Telephone Encounter (Addendum)
Left VM with details, asked to return call with any questions.  ----- Message from Hollice Espy, MD sent at 01/07/2020  4:50 PM EDT ----- PSA is undetectable.  Great news.

## 2020-05-10 ENCOUNTER — Other Ambulatory Visit (INDEPENDENT_AMBULATORY_CARE_PROVIDER_SITE_OTHER): Payer: Medicare Other | Admitting: Hospice and Palliative Medicine

## 2020-05-10 ENCOUNTER — Encounter: Payer: Self-pay | Admitting: Hospice and Palliative Medicine

## 2020-05-10 ENCOUNTER — Other Ambulatory Visit: Payer: Self-pay | Admitting: Hospice and Palliative Medicine

## 2020-05-10 DIAGNOSIS — G4733 Obstructive sleep apnea (adult) (pediatric): Secondary | ICD-10-CM | POA: Diagnosis not present

## 2020-05-10 DIAGNOSIS — I10 Essential (primary) hypertension: Secondary | ICD-10-CM | POA: Diagnosis not present

## 2020-05-10 DIAGNOSIS — Z9989 Dependence on other enabling machines and devices: Secondary | ICD-10-CM

## 2020-05-10 NOTE — Progress Notes (Signed)
[facility], [place] [address]  Pulmonary Sleep Medicine   Office Visit Note  Patient Name: Jerry Welch DOB: [birth year] MRN [id number]  Date of Service: 05/10/2020  Chief Complaint: Obstructive Sleep Apnea visit  Brief History:  Jerry Welch is seen today for OSA and CPAP compliance follow-up The patient has a 5 year history of sleep apnea. Patient has been using PAP nightly. His machine is part of the current recall, will need a new machine. His machine has stopped recording his compliance the last 30 days. The patient feels much better after sleeping with PAP.  The patient reports benefits from PAP use. Reported sleepiness is fairly well controlled and the Epworth Sleepiness Score is 10 out of 24. The patient does not routinely take naps. The patient complains of the following: has no complaints at this time  The compliance download shows  compliance with an average use time of 7 hours 49 minutes. The AHI is 5.4  The patient does not complain of limb movements disrupting sleep.    ROS  General: (-) fever, (-) chills, (-) night sweat Nose and Sinuses: (-) nasal stuffiness or itchiness, (-) postnasal drip, (-) nosebleeds, (-) sinus trouble. Mouth and Throat: (-) sore throat, (-) hoarseness. Neck: (-) swollen glands, (-) enlarged thyroid, (-) neck pain. Respiratory: - cough, - shortness of breath, - wheezing. Neurologic: - numbness, - tingling. Psychiatric: - anxiety, - depression   Current Medication: Outpatient Encounter Medications as of 05/10/2020  Medication Sig  . cyanocobalamin (,VITAMIN B-12,) 1000 MCG/ML injection Inject 1,000 mcg into the muscle every 14 (fourteen) days.   . naproxen sodium (ANAPROX) 220 MG tablet Take 440 mg by mouth daily.  Marland Kitchen omeprazole (PRILOSEC) 40 MG capsule Take 40 mg by mouth daily.   Marland Kitchen POTASSIUM CHLORIDE ER PO Take 40 mEq by mouth daily.   . valsartan-hydrochlorothiazide (DIOVAN-HCT) 320-25 MG tablet   .  verapamil (COVERA HS) 240 MG (CO) 24 hr tablet Take 240 mg by mouth 2 (two) times daily.    No facility-administered encounter medications on file as of 05/10/2020.    Surgical History: Past Surgical History:  Procedure Laterality Date  . APPENDECTOMY    . BACK SURGERY  09/2013   L4-L5   . BASAL CELL CARCINOMA EXCISION  2012   right neck   . PELVIC LYMPH NODE DISSECTION Bilateral 07/09/2017   Procedure: PELVIC LYMPH NODE DISSECTION;  Surgeon: Hollice Espy, MD;  Location: [facility] ORS;  Service: Urology;  Laterality: Bilateral;  . PROSTATE BIOPSY    . 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 REPLACEMENT TOTAL KNEE     right x 2  . ROBOT ASSISTED LAPAROSCOPIC RADICAL PROSTATECTOMY N/A 07/09/2017   Procedure: ROBOTIC ASSISTED LAPAROSCOPIC RADICAL PROSTATECTOMY;  Surgeon: Hollice Espy, MD;  Location: [facility] ORS;  Service: Urology;  Laterality: N/A;    Medical History: Past Medical History:  Diagnosis Date  . Back pain   . GERD (gastroesophageal reflux disease)   . History of hiatal hernia   . HLD (hyperlipidemia) 04/30/2015  . Hypertension   . Osteoarthrosis involving multiple sites   . Prostate cancer [facility]) 2019   prostatectomy  . Ruptured lumbar disc    L4 & L5  . Sleep apnea     Family History: Non contributory to the present illness  Social History: Social History   Socioeconomic History  . Marital status: Married    Spouse name: Not on file  . Number of children:  Not on file  . Years of education: Not on file  . Highest education level: Not on file  Occupational History  . Not on file  Tobacco Use  . Smoking status: Former Smoker    Packs/day: 1.00    Types: Cigarettes    Quit date: 06/06/1981    Years since quitting: 38.9  . Smokeless tobacco: Former Network engineer  . Vaping Use: Never used  Substance and Sexual Activity  . Alcohol use: Yes    Alcohol/week: 1.0 standard drink    Types: 1 Standard drinks or equivalent per week  . Drug use: No  .  Sexual activity: Not on file  Other Topics Concern  . Not on file  Social History Narrative  . Not on file   Social Determinants of Health   Financial Resource Strain:   . Difficulty of Paying Living Expenses:   Food Insecurity:   . Worried About Charity fundraiser in the Last Year:   . Arboriculturist in the Last Year:   Transportation Needs:   . Film/video editor (Medical):   Marland Kitchen Lack of Transportation (Non-Medical):   Physical Activity:   . Days of Exercise per Week:   . Minutes of Exercise per Session:   Stress:   . Feeling of Stress :   Social Connections:   . Frequency of Communication with Friends and Family:   . Frequency of Social Gatherings with Friends and Family:   . Attends Religious Services:   . Active Member of Clubs or Organizations:   . Attends Archivist Meetings:   Marland Kitchen Marital Status:   Intimate Partner Violence:   . Fear of Current or Ex-Partner:   . Emotionally Abused:   Marland Kitchen Physically Abused:   . Sexually Abused:     Vital Signs: Blood pressure (!) 168/104, pulse 77, height 5\' 9"  (1.753 m), weight 201 lb (91.2 kg), SpO2 96 %.  Examination: General Appearance: The patient is well-developed, well-nourished, and in no distress. Neck Circumference: N/A Skin: Gross inspection of skin unremarkable. Head: normocephalic, no gross deformities. Eyes: no gross deformities noted. ENT: ears appear grossly normal Neurologic: Alert and oriented. No involuntary movements.    EPWORTH SLEEPINESS SCALE:  Scale:  (0)= no chance of dozing; (1)= slight chance of dozing; (2)= moderate chance of dozing; (3)= high chance of dozing  Chance  Situtation    Sitting and reading: 2    Watching TV: 2    Sitting Inactive in public: 2    As a passenger in car: 1      Lying down to rest: 2    Sitting and talking: 0    Sitting quielty after lunch: 1    In a car, stopped in traffic: 0   TOTAL SCORE:   10 out of 24    SLEEP STUDIES:  1. HST  2016, REA 40, SpO2 minimum 71%   CPAP COMPLIANCE DATA:  Date Range: 05/12/2019-05/10/2020, last 30 days of use not recorded  Average Daily Use: 7.49 hours  Median Use: 6.23 hours  Compliance for > 4 Hours: 80.8% days  AHI: 5.4 respiratory events per hour  Days Used: 298  Mask Leak: 1 minute  LABS: No results found for this or any previous visit (from the past 2160 hour(s)).  Radiology: CT Hematuria Workup  Result Date: 10/17/2019 CLINICAL DATA:  Painless gross hematuria. Urinary frequency. Previous prostatectomy for prostate carcinoma. Previous appendectomy and inguinal hernia repair. EXAM: CT ABDOMEN AND  PELVIS WITHOUT AND WITH CONTRAST TECHNIQUE: Multidetector CT imaging of the abdomen and pelvis was performed following the standard protocol before and following the bolus administration of intravenous contrast. CONTRAST:  174mL OMNIPAQUE IOHEXOL 300 MG/ML  SOLN COMPARISON:  None. FINDINGS: Lower Chest: No acute findings. Hepatobiliary: No hepatic masses identified. Mild diffuse hepatic steatosis noted. Gallbladder is unremarkable. No evidence of biliary ductal dilatation. Pancreas:  No mass or inflammatory changes. Spleen: Within normal limits in size and appearance. Adrenals/Urinary Tract: No adrenal masses identified. A 3 mm calculus is seen in the upper pole the left kidney. No evidence of ureteral calculi or hydronephrosis. No renal masses identified. No masses seen involving the ureters or bladder. A well-circumscribed fluid collection with thin enhancing rim is seen just anterior to the urinary bladder in the space of Retzius, with linear soft tissue density extending to the umbilicus. This measures 5.6 x 3.3 cm, and differential diagnosis includes urachal cyst or postop fluid collection. Stomach/Bowel: No evidence of obstruction, inflammatory process or abnormal fluid collections. Diverticulosis is seen mainly involving the sigmoid colon, however there is no evidence of  diverticulitis. Small periampullary duodenal diverticulum also noted. Vascular/Lymphatic: No pathologically enlarged lymph nodes. No abdominal aortic aneurysm. Aortic atherosclerosis incidentally noted. Reproductive: Postop changes from prior prostatectomy. No evidence of pelvic masses. Other:  None. Musculoskeletal:  No suspicious bone lesions identified. IMPRESSION: 1. 3 mm left renal calculus. No evidence of ureteral calculi or hydronephrosis. 2. No radiographic evidence of urinary tract neoplasm. 3. 5.6 cm well-circumscribed fluid collection in the Space of Retzius, with linear soft tissue density extending to the umbilicus. Differential diagnosis includes urachal cyst or postop fluid collection. 4. Colonic diverticulosis. No radiographic evidence of diverticulitis. 5. Mild hepatic steatosis. Electronically Signed   By: Marlaine Hind M.D.   On: 10/17/2019 13:56    Assessment and Plan: Patient Active Problem List   Diagnosis Date Noted  . Prostate cancer ([place]) 07/09/2017  . HLD (hyperlipidemia) 04/30/2015  . Arthritis, degenerative 04/30/2015  . Elevated glucose 12/02/2014  . Elevated serum creatinine 12/02/2014  . Leg swelling 12/02/2014  . Obesity 12/02/2014  . Preop cardiovascular exam 10/06/2013  . 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 Abnormal prostate specific antigen 07/23/2013  . Benign prostatic hyperplasia with urinary obstruction 07/23/2013  . Excessive urination at night 07/23/2013  . Decreased urine stream 07/23/2013  . FOM (frequency of micturition) 07/23/2013  . Elevated prostate specific antigen (PSA) 07/23/2013  . Slowing of urinary stream 07/23/2013  . HTN (hypertension) 07/26/2011  . Somnolence 07/26/2011  . SOB (shortness of breath) 07/26/2011   The patient does tolerate PAP and reports benefit from PAP use. The patient was reminded how to clean his machine properly and advised to stop using So Clean machine. The patient was also counselled on  continuing with CPAP use nightly. The compliance is very good. The AHI is 5.4. We will contact Medicare to get a new machine due to his current machine being a part of the recall. In the interim we encouraged him to use a HEPA filter due to the immediate risks of him not using his CPAP until replacement machine.   1. OSA on CPAP Will need new machine due to recall, will contact Medicare to start this process. In the interim continue with his current CPAP and add HEPA filter for protection due to the immediate risk to him if not wearing CPAP nightly. Advised to stop using So Clean machine and use vinegar and water. Will follow-up 30  days after using new machine.  2. Essential hypertension BP elevated today. Encouraged to monitor his BP regularly and to follow-up with PCP.  General Counseling: I have discussed the findings of the evaluation and examination with Filipe.  I have also discussed any further diagnostic evaluation thatmay be needed or ordered today. Lindon verbalizes understanding of the findings of todays visit. We also reviewed his medications today and discussed drug interactions and side effects including but not limited excessive drowsiness and altered mental states. We also discussed that there is always a risk not just to him but also people around him. he has been encouraged to call the office with any questions or concerns that should arise related to todays visit.    This patient was seen today by Theodoro Grist, AGNP-C in collaboration with Dr. Allyne Gee as part of a collaborative care agreement.  I have personally obtained a history, examined the patient, evaluated laboratory and imaging results, formulated the assessment and plan and placed orders.   Richelle Ito Saunders Glance, PhD, FAASM  Diplomate, American Board of Sleep Medicine    Allyne Gee, MD [facility] Diplomate ABMS Pulmonary and Critical Care Medicine Sleep medicine

## 2020-05-10 NOTE — Patient Instructions (Signed)

## 2020-05-17 ENCOUNTER — Ambulatory Visit (INDEPENDENT_AMBULATORY_CARE_PROVIDER_SITE_OTHER): Payer: Medicare Other | Admitting: Dermatology

## 2020-05-17 ENCOUNTER — Other Ambulatory Visit: Payer: Self-pay

## 2020-05-17 DIAGNOSIS — C439 Malignant melanoma of skin, unspecified: Secondary | ICD-10-CM

## 2020-05-17 DIAGNOSIS — C44519 Basal cell carcinoma of skin of other part of trunk: Secondary | ICD-10-CM | POA: Diagnosis not present

## 2020-05-17 DIAGNOSIS — D225 Melanocytic nevi of trunk: Secondary | ICD-10-CM

## 2020-05-17 DIAGNOSIS — D229 Melanocytic nevi, unspecified: Secondary | ICD-10-CM

## 2020-05-17 DIAGNOSIS — C44612 Basal cell carcinoma of skin of right upper limb, including shoulder: Secondary | ICD-10-CM | POA: Diagnosis not present

## 2020-05-17 DIAGNOSIS — C4491 Basal cell carcinoma of skin, unspecified: Secondary | ICD-10-CM

## 2020-05-17 DIAGNOSIS — D0359 Melanoma in situ of other part of trunk: Secondary | ICD-10-CM | POA: Diagnosis not present

## 2020-05-17 DIAGNOSIS — Z1283 Encounter for screening for malignant neoplasm of skin: Secondary | ICD-10-CM | POA: Diagnosis not present

## 2020-05-17 DIAGNOSIS — L570 Actinic keratosis: Secondary | ICD-10-CM

## 2020-05-17 DIAGNOSIS — L821 Other seborrheic keratosis: Secondary | ICD-10-CM

## 2020-05-17 DIAGNOSIS — D239 Other benign neoplasm of skin, unspecified: Secondary | ICD-10-CM

## 2020-05-17 DIAGNOSIS — D1800 Hemangioma unspecified site: Secondary | ICD-10-CM

## 2020-05-17 DIAGNOSIS — L814 Other melanin hyperpigmentation: Secondary | ICD-10-CM

## 2020-05-17 DIAGNOSIS — L578 Other skin changes due to chronic exposure to nonionizing radiation: Secondary | ICD-10-CM

## 2020-05-17 DIAGNOSIS — D485 Neoplasm of uncertain behavior of skin: Secondary | ICD-10-CM

## 2020-05-17 HISTORY — DX: Other benign neoplasm of skin, unspecified: D23.9

## 2020-05-17 HISTORY — DX: Malignant melanoma of skin, unspecified: C43.9

## 2020-05-17 HISTORY — DX: Basal cell carcinoma of skin, unspecified: C44.91

## 2020-05-17 NOTE — Progress Notes (Signed)
New Patient Visit  Subjective  Jerry Welch is a 69 y.o. male who presents for the following: lesions (Patient here to have some spots evaluated, referred by Dr. Kary Kos. ). Spot at right post shoulder present for about 8 months. Patient advises it busted and drained clear fluid and bled. He also has several moles on back that need to be checked. Patient prefers to have TBSE today.  The patient presents for Total-Body Skin Exam (TBSE) for skin cancer screening and mole check.  The following portions of the chart were reviewed this encounter and updated as appropriate:  Tobacco  Allergies  Meds  Problems  Med Hx  Surg Hx  Fam Hx     Review of Systems:  No other skin or systemic complaints except as noted in HPI or Assessment and Plan.  Objective  Well appearing patient in no apparent distress; mood and affect are within normal limits.  A full examination was performed including scalp, head, eyes, ears, nose, lips, neck, chest, axillae, abdomen, back, buttocks, bilateral upper extremities, bilateral lower extremities, hands, feet, fingers, toes, fingernails, and toenails. All findings within normal limits unless otherwise noted below.  Objective  Right Top of Shoulder: Pearly plaque 1.2cm  Objective  Left inferior scapula: Crusted patch 1.1cm  Objective  Mid to Upper Back Left paraspinal: 0.7cm irregular brown macule  Objective  Left Mid to Upper Back 6.0cm lat to spine: Dark macule 0.4cm  Objective  Right ear x 2, Left ear x 3 (5): Erythematous thin papules/macules with gritty scale.    Assessment & Plan  Neoplasm of uncertain behavior of skin (4) Right Top of Shoulder  Epidermal / dermal shaving  Lesion diameter (cm):  1.2 Informed consent: discussed and consent obtained   Timeout: patient name, date of birth, surgical site, and procedure verified   Procedure prep:  Patient was prepped and draped in usual sterile fashion Prep type:  Isopropyl  alcohol Anesthesia: the lesion was anesthetized in a standard fashion   Anesthetic:  1% lidocaine w/ epinephrine 1-100,000 buffered w/ 8.4% NaHCO3 Instrument used: flexible razor blade   Hemostasis achieved with: pressure, aluminum chloride and electrodesiccation   Outcome: patient tolerated procedure well   Post-procedure details: sterile dressing applied and wound care instructions given   Dressing type: bandage and petrolatum    Destruction of lesion Complexity: extensive   Destruction method: electrodesiccation and curettage   Informed consent: discussed and consent obtained   Timeout:  patient name, date of birth, surgical site, and procedure verified Procedure prep:  Patient was prepped and draped in usual sterile fashion Prep type:  Isopropyl alcohol Anesthesia: the lesion was anesthetized in a standard fashion   Anesthetic:  1% lidocaine w/ epinephrine 1-100,000 buffered w/ 8.4% NaHCO3 Curettage performed in three different directions: Yes   Electrodesiccation performed over the curetted area: Yes   Lesion length (cm):  1.2 Lesion width (cm):  1.2 Margin per side (cm):  0.2 Final wound size (cm):  1.6 Hemostasis achieved with:  pressure, aluminum chloride and electrodesiccation Outcome: patient tolerated procedure well with no complications   Post-procedure details: sterile dressing applied and wound care instructions given   Dressing type: bandage and petrolatum    Specimen 1 - Surgical pathology Differential Diagnosis: r/o BCC Check Margins: No Pearly plaque 1.2cm  Left inferior scapula  Epidermal / dermal shaving  Lesion diameter (cm):  1.1  Destruction of lesion Complexity: extensive   Destruction method: electrodesiccation and curettage   Informed consent: discussed and  consent obtained   Timeout:  patient name, date of birth, surgical site, and procedure verified Procedure prep:  Patient was prepped and draped in usual sterile fashion Prep type:  Isopropyl  alcohol Anesthesia: the lesion was anesthetized in a standard fashion   Anesthetic:  1% lidocaine w/ epinephrine 1-100,000 buffered w/ 8.4% NaHCO3 Curettage performed in three different directions: Yes   Electrodesiccation performed over the curetted area: Yes   Lesion length (cm):  1.1 Lesion width (cm):  1.1 Margin per side (cm):  0.2 Final wound size (cm):  1.5 Hemostasis achieved with:  pressure, aluminum chloride and electrodesiccation Outcome: patient tolerated procedure well with no complications   Post-procedure details: sterile dressing applied and wound care instructions given   Dressing type: bandage and petrolatum    Specimen 2 - Surgical pathology Differential Diagnosis: r/o BCC Check Margins: No Crusted patch 1.1cm  Mid to Upper Back Left paraspinal  Epidermal / dermal shaving  Lesion diameter (cm):  0.7 Informed consent: discussed and consent obtained   Timeout: patient name, date of birth, surgical site, and procedure verified   Procedure prep:  Patient was prepped and draped in usual sterile fashion Prep type:  Isopropyl alcohol Anesthesia: the lesion was anesthetized in a standard fashion   Anesthetic:  1% lidocaine w/ epinephrine 1-100,000 buffered w/ 8.4% NaHCO3 Instrument used: flexible razor blade   Hemostasis achieved with: pressure, aluminum chloride and electrodesiccation   Outcome: patient tolerated procedure well   Post-procedure details: sterile dressing applied and wound care instructions given   Dressing type: bandage and petrolatum    Specimen 3 - Surgical pathology Differential Diagnosis: Nevus vs Dysplastic Nevus Check Margins: No 0.7cm irregular brown macule  Left Mid to Upper Back 6.0cm lat to spine  Skin / nail biopsy Type of biopsy: tangential   Informed consent: discussed and consent obtained   Timeout: patient name, date of birth, surgical site, and procedure verified   Procedure prep:  Patient was prepped and draped in usual  sterile fashion Prep type:  Isopropyl alcohol Anesthesia: the lesion was anesthetized in a standard fashion   Anesthetic:  1% lidocaine w/ epinephrine 1-100,000 buffered w/ 8.4% NaHCO3 Instrument used: flexible razor blade   Hemostasis achieved with: pressure, aluminum chloride and electrodesiccation   Outcome: patient tolerated procedure well   Post-procedure details: sterile dressing applied and wound care instructions given   Dressing type: petrolatum and bandage    Specimen 4 - Surgical pathology Differential Diagnosis: Nevus vs Dysplastic Nevus Check Margins: No Dark macule 0.4cm  AK (actinic keratosis) (5) Right ear x 2, Left ear x 3  Destruction of lesion - Right ear x 2, Left ear x 3 Complexity: simple   Destruction method: cryotherapy   Informed consent: discussed and consent obtained   Timeout:  patient name, date of birth, surgical site, and procedure verified Lesion destroyed using liquid nitrogen: Yes   Region frozen until ice ball extended beyond lesion: Yes   Outcome: patient tolerated procedure well with no complications   Post-procedure details: wound care instructions given    Skin cancer screening   Lentigines - Scattered tan macules - Discussed due to sun exposure - Benign, observe - Call for any changes  Seborrheic Keratoses - Stuck-on, waxy, tan-brown papules and plaques  - Discussed benign etiology and prognosis. - Observe - Call for any changes  Melanocytic Nevi - Tan-brown and/or pink-flesh-colored symmetric macules and papules - Benign appearing on exam today - Observation - Call clinic for new or changing  moles - Recommend daily use of broad spectrum spf 30+ sunscreen to sun-exposed areas.   Hemangiomas - Red papules - Discussed benign nature - Observe - Call for any changes  Actinic Damage - diffuse scaly erythematous macules with underlying dyspigmentation - Recommend daily broad spectrum sunscreen SPF 30+ to sun-exposed areas,  reapply every 2 hours as needed.  - Call for new or changing lesions.  Skin cancer screening performed today.  Return in about 3 months (around 08/17/2020) for AK follow up.  Graciella Belton, RMA, am acting as scribe for Sarina Ser, MD . Documentation: I have reviewed the above documentation for accuracy and completeness, and I agree with the above.  Sarina Ser, MD

## 2020-05-17 NOTE — Patient Instructions (Addendum)
Wound Care Instructions  1. Cleanse wound gently with soap and water once a day then pat dry with clean gauze. Apply a thing coat of Petrolatum (petroleum jelly, "Vaseline") over the wound (unless you have an allergy to this). We recommend that you use a new, sterile tube of Vaseline. Do not pick or remove scabs. Do not remove the yellow or white "healing tissue" from the base of the wound.  2. Cover the wound with fresh, clean, nonstick gauze and secure with paper tape. You may use Band-Aids in place of gauze and tape if the would is small enough, but would recommend trimming much of the tape off as there is often too much. Sometimes Band-Aids can irritate the skin.  3. You should call the office for your biopsy report after 1 week if you have not already been contacted.  4. If you experience any problems, such as abnormal amounts of bleeding, swelling, significant bruising, significant pain, or evidence of infection, please call the office immediately.  5. FOR ADULT SURGERY PATIENTS: If you need something for pain relief you may take 1 extra strength Tylenol (acetaminophen) AND 2 Ibuprofen (200mg each) together every 4 hours as needed for pain. (do not take these if you are allergic to them or if you have a reason you should not take them.) Typically, you may only need pain medication for 1 to 3 days.    Cryotherapy Aftercare  . Wash gently with soap and water everyday.   . Apply Vaseline and Band-Aid daily until healed. Melanoma ABCDEs  Melanoma is the most dangerous type of skin cancer, and is the leading cause of death from skin disease.  You are more likely to develop melanoma if you: Have light-colored skin, light-colored eyes, or red or blond hair Spend a lot of time in the sun Tan regularly, either outdoors or in a tanning bed Have had blistering sunburns, especially during childhood Have a close family member who has had a melanoma Have atypical moles or large birthmarks  Early  detection of melanoma is key since treatment is typically straightforward and cure rates are extremely high if we catch it early.   The first sign of melanoma is often a change in a mole or a new dark spot.  The ABCDE system is a way of remembering the signs of melanoma.  A for asymmetry:  The two halves do not match. B for border:  The edges of the growth are irregular. C for color:  A mixture of colors are present instead of an even brown color. D for diameter:  Melanomas are usually (but not always) greater than 6mm - the size of a pencil eraser. E for evolution:  The spot keeps changing in size, shape, and color.  Please check your skin once per month between visits. You can use a small mirror in front and a large mirror behind you to keep an eye on the back side or your body.   If you see any new or changing lesions before your next follow-up, please call to schedule a visit.  Please continue daily skin protection including broad spectrum sunscreen SPF 30+ to sun-exposed areas, reapplying every 2 hours as needed when you're outdoors.   

## 2020-05-19 ENCOUNTER — Encounter: Payer: Self-pay | Admitting: Dermatology

## 2020-05-20 ENCOUNTER — Telehealth: Payer: Self-pay

## 2020-05-20 NOTE — Telephone Encounter (Signed)
I tried to contact the patient twice but each time I called the phone picks up then hangs up.

## 2020-05-20 NOTE — Telephone Encounter (Signed)
-----   Message from Ralene Bathe, MD sent at 05/19/2020  4:36 PM EDT ----- I have called this pt about his melanoma (and other diagnoses) He needs to be called to schedule surgery for Melanoma - at which time we will do Melanoma Matrix visit. And EDC for BCC. thanks

## 2020-05-24 ENCOUNTER — Telehealth: Payer: Self-pay

## 2020-05-24 NOTE — Telephone Encounter (Signed)
Patient informed of results by Dr. Nehemiah Massed and surgery scheduled.

## 2020-05-24 NOTE — Telephone Encounter (Signed)
-----   Message from Ralene Bathe, MD sent at 05/19/2020  4:30 PM EDT ----- 1. Skin , right top of shoulder BASAL CELL CARCINOMA, NODULAR PATTERN, AND SOLAR LENTIGO WITH INCREASED NUMBERS OF MELANOCYTES, SEE DESCRIPTION 2. Skin , left inferior scapula BASAL CELL CARCINOMA, NODULAR PATTERN 3. Skin , mid to upper back left paraspinal MALIGNANT MELANOMA IN SITU, LATERAL MARGIN INVOLVED, SEE DESCRIPTION 4. 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

## 2020-06-21 ENCOUNTER — Other Ambulatory Visit: Payer: Self-pay

## 2020-06-21 ENCOUNTER — Other Ambulatory Visit: Payer: Medicare Other

## 2020-06-21 DIAGNOSIS — C61 Malignant neoplasm of prostate: Secondary | ICD-10-CM

## 2020-06-22 LAB — PSA: Prostate Specific Ag, Serum: <0.1 ng/mL (ref 0.0–4.0)

## 2020-06-23 ENCOUNTER — Ambulatory Visit: Payer: Self-pay | Admitting: Urology

## 2020-06-28 NOTE — Progress Notes (Signed)
06/29/2020 11:04 AM   Jerry Welch [birth year] [id number]  Referring provider: [name], MD [address] [facility] [place],  [place] [address]  Chief Complaint  Patient presents with  . Follow-up    HPI: 69 year old male 2 year status post robotic radical prostatectomy with bilateral pelvic lymph node dissection and high risk hematuria.  He returns today for routine follow-up.  Prostate cancer S/P robotic prostatectomy with Dr. Erlene Quan in 06/2017.  Surgical pathology consistent with Gleason 3+4.  There was evidence of extraprostatic extension.  Negative margins.  Negative lymph nodes.  Negative seminal vesicles. pT3a N0.  PSA remains undetectable as of 05/2020.  In terms of incontinence,  he reports that is is minimal.   He leaks on a tiny amount with strenuous activity.  He wears a safety pad.  This not bothered by this.  He has not had any erections since his prostate surgery on 07/09/2017.    High Risk Hematuria Former smoker.  CTU 09/2019 3 mm left renal calculus. No evidence of ureteral calculi or hydronephrosis.  No radiographic evidence of urinary tract neoplasm.  5.6 cm well-circumscribed fluid collection in the Space of Retzius, with linear soft tissue density extending to the umbilicus.  Differential diagnosis includes urachal cyst or postop fluid collection.  Colonic diverticulosis. No radiographic evidence of diverticulitis.  Mild hepatic steatosis.  Cysto unremarkable with Dr. Erlene Quan in 09/2019.  He does not report any gross hematuria.    PMH: Past Medical History:  Diagnosis Date  . Back pain   . GERD (gastroesophageal reflux disease)   . History of hiatal hernia   . HLD (hyperlipidemia) 04/30/2015  . Hypertension   . Osteoarthrosis involving multiple sites   . Prostate cancer [facility]) 2019   prostatectomy  . Ruptured lumbar disc    L4 & L5  . Sleep apnea     Surgical History: Past Surgical History:  Procedure Laterality Date  .  APPENDECTOMY    . BACK SURGERY  09/2013   L4-L5   . BASAL CELL CARCINOMA EXCISION  2012   right neck   . PELVIC LYMPH NODE DISSECTION Bilateral 07/09/2017   Procedure: PELVIC LYMPH NODE DISSECTION;  Surgeon: Hollice Espy, MD;  Location: [facility] ORS;  Service: Urology;  Laterality: Bilateral;  . PROSTATE BIOPSY    . 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 REPLACEMENT TOTAL KNEE     right x 2  . ROBOT ASSISTED LAPAROSCOPIC RADICAL PROSTATECTOMY N/A 07/09/2017   Procedure: ROBOTIC ASSISTED LAPAROSCOPIC RADICAL PROSTATECTOMY;  Surgeon: Hollice Espy, MD;  Location: [facility] ORS;  Service: Urology;  Laterality: N/A;    Home Medications:  Allergies as of 06/29/2020      Reactions   Amlodipine Other (See Comments)   LE EDEMA   Lisinopril    Cough-onset      Medication List       Accurate as of June 29, 2020 11:04 AM. If you have any questions, ask your nurse or doctor.        cyanocobalamin 1000 MCG/ML injection Commonly known as: (VITAMIN B-12) Inject 1,000 mcg into the muscle every 14 (fourteen) days.   naproxen sodium 220 MG tablet Commonly known as: ALEVE Take 440 mg by mouth daily.   omeprazole 40 MG capsule Commonly known as: PRILOSEC Take 40 mg by mouth daily.   POTASSIUM CHLORIDE ER PO Take 40 mEq by mouth daily.   valsartan-hydrochlorothiazide 320-25 MG tablet Commonly known as: DIOVAN-HCT  verapamil 240 MG (CO) 24 hr tablet Commonly known as: COVERA HS Take 240 mg by mouth 2 (two) times daily.       Allergies:  Allergies  Allergen Reactions  . Amlodipine Other (See Comments)    LE EDEMA  . Lisinopril     Cough-onset     Family History: Family History  Problem Relation Age of Onset  . Heart failure Mother   . Heart attack Maternal Grandfather   . Bladder Cancer Neg Hx   . Prostate cancer Neg Hx   . Kidney cancer Neg Hx     Social History:  reports that he quit smoking about 39 years ago. His smoking use included cigarettes. He smoked  1.00 pack per day. He has quit using smokeless tobacco. He reports current alcohol use of about 1.0 standard drink of alcohol per week. He reports that he does not use drugs.  ROS: For pertinent review of systems please refer to history of present illness  Physical Exam: BP 139/89   Pulse 66   Ht 5\' 9"  (1.753 m)   Wt 203 lb 1.6 oz (92.1 kg)   BMI 29.99 kg/m   Constitutional:  Well nourished. Alert and oriented, No acute distress. HEENT: [place] AT, mask in place.  Trachea midline Cardiovascular: No clubbing, cyanosis, or edema. Respiratory: Normal respiratory effort, no increased work of breathing. Neurologic: Grossly intact, no focal deficits, moving all 4 extremities. Psychiatric: Normal mood and affect.  Laboratory Data: Lab Results  Component Value Date   WBC 11.6 (H) 07/10/2017   HGB 14.1 07/10/2017   HCT 40.8 07/10/2017   MCV 85.6 07/10/2017   PLT 177 07/10/2017    Lab Results  Component Value Date   CREATININE 1.40 (H) 10/17/2019   PSA as above  Urinalysis N/a  Pertinent Imaging: n/a  Assessment & Plan:    1. Prostate cancer ([place]) p T3a N0 s/p prostatectomy 06/2017 PSA is undetectable PSA in 6 months RTC in 1 year  2. Stress incontinence, male Minimal without bother Encouraged continued Kegels, lifelong  3. High risk hematuria Work up with CT urogram and cystoscopy in 09/2019 - NED No reports of gross hematuria    Return in about 6 months (around 12/28/2020) for PSA only .  Zara Council, PA-C  [facility] [address] [place], [place] [address] [phone]

## 2020-06-29 ENCOUNTER — Ambulatory Visit (INDEPENDENT_AMBULATORY_CARE_PROVIDER_SITE_OTHER): Payer: Medicare Other | Admitting: Urology

## 2020-06-29 ENCOUNTER — Other Ambulatory Visit: Payer: Self-pay

## 2020-06-29 ENCOUNTER — Encounter: Payer: Self-pay | Admitting: Urology

## 2020-06-29 VITALS — BP 139/89 | HR 66 | Ht 69.0 in | Wt 203.1 lb

## 2020-06-29 DIAGNOSIS — C61 Malignant neoplasm of prostate: Secondary | ICD-10-CM

## 2020-06-29 DIAGNOSIS — N393 Stress incontinence (female) (male): Secondary | ICD-10-CM

## 2020-06-29 DIAGNOSIS — R319 Hematuria, unspecified: Secondary | ICD-10-CM | POA: Diagnosis not present

## 2020-07-05 ENCOUNTER — Ambulatory Visit (INDEPENDENT_AMBULATORY_CARE_PROVIDER_SITE_OTHER): Payer: Medicare Other | Admitting: Internal Medicine

## 2020-07-05 VITALS — BP 162/93 | HR 66 | Resp 16 | Ht 69.0 in | Wt 206.8 lb

## 2020-07-05 DIAGNOSIS — Z9989 Dependence on other enabling machines and devices: Secondary | ICD-10-CM | POA: Diagnosis not present

## 2020-07-05 DIAGNOSIS — Z7189 Other specified counseling: Secondary | ICD-10-CM

## 2020-07-05 DIAGNOSIS — G4733 Obstructive sleep apnea (adult) (pediatric): Secondary | ICD-10-CM

## 2020-07-05 NOTE — Patient Instructions (Signed)

## 2020-07-05 NOTE — Progress Notes (Signed)
[facility] [place], [place] [address]  Pulmonary Sleep Medicine   Office Visit Note  Patient Name: Jerry Welch DOB: [birth year] MRN [id number]    Chief Complaint: Obstructive Sleep Apnea visit  Brief History:  Jerry Welch is seen today for follow The patient has a 5 history of sleep apnea. He recently received a replacement CPAP  Patient is using PAP nightly.  The patient feels more rested after sleeping with PAP.  The patient reports benefiting from PAP use. Reported sleepiness is  Improved  and the Epworth Sleepiness Score is 11 out of 24. The patient does not normally take naps. The patient complains of the following: mask leak.The compliance download shows excellent compliance with an average use time of8  hours. The AHI is 4.1  The patient does not complain of limb movements disrupting sleep.  ROS  General: (-) fever, (-) chills, (-) night sweat Nose and Sinuses: (-) nasal stuffiness or itchiness, (-) postnasal drip, (-) nosebleeds, (-) sinus trouble. Mouth and Throat: (-) sore throat, (-) hoarseness. Neck: (-) swollen glands, (-) enlarged thyroid, (-) neck pain. Respiratory: - cough, - shortness of breath, - wheezing. Neurologic: - numbness, - tingling. Psychiatric: - anxiety, - depression   Current Medication: Outpatient Encounter Medications as of 07/05/2020  Medication Sig  . cyanocobalamin (,VITAMIN B-12,) 1000 MCG/ML injection Inject 1,000 mcg into the muscle every 14 (fourteen) days.   . naproxen sodium (ANAPROX) 220 MG tablet Take 440 mg by mouth daily.  Marland Kitchen omeprazole (PRILOSEC) 40 MG capsule Take 40 mg by mouth daily.   Marland Kitchen POTASSIUM CHLORIDE ER PO Take 40 mEq by mouth daily.   . valsartan-hydrochlorothiazide (DIOVAN-HCT) 320-25 MG tablet   . verapamil (COVERA HS) 240 MG (CO) 24 hr tablet Take 240 mg by mouth 2 (two) times daily.    No facility-administered encounter medications on file as of 07/05/2020.    Surgical History: Past  Surgical History:  Procedure Laterality Date  . APPENDECTOMY    . BACK SURGERY  09/2013   L4-L5   . BASAL CELL CARCINOMA EXCISION  2012   right neck   . PELVIC LYMPH NODE DISSECTION Bilateral 07/09/2017   Procedure: PELVIC LYMPH NODE DISSECTION;  Surgeon: Hollice Espy, MD;  Location: [facility] ORS;  Service: Urology;  Laterality: Bilateral;  . PROSTATE BIOPSY    . 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 REPLACEMENT TOTAL KNEE     right x 2  . ROBOT ASSISTED LAPAROSCOPIC RADICAL PROSTATECTOMY N/A 07/09/2017   Procedure: ROBOTIC ASSISTED LAPAROSCOPIC RADICAL PROSTATECTOMY;  Surgeon: Hollice Espy, MD;  Location: [facility] ORS;  Service: Urology;  Laterality: N/A;    Medical History: Past Medical History:  Diagnosis Date  . Back pain   . GERD (gastroesophageal reflux disease)   . History of hiatal hernia   . HLD (hyperlipidemia) 04/30/2015  . Hypertension   . Osteoarthrosis involving multiple sites   . Prostate cancer [facility]) 2019   prostatectomy  . Ruptured lumbar disc    L4 & L5  . Sleep apnea     Family History: Non contributory to the present illness  Social History: Social History   Socioeconomic History  . Marital status: Married    Spouse name: Not on file  . Number of children: Not on file  . Years of education: Not on file  . Highest education level: Not on file  Occupational History  . Not on file  Tobacco Use  . Smoking status: Former  Smoker    Packs/day: 1.00    Types: Cigarettes    Quit date: 06/06/1981    Years since quitting: 39.1  . Smokeless tobacco: Former Network engineer  . Vaping Use: Never used  Substance and Sexual Activity  . Alcohol use: Yes    Alcohol/week: 1.0 standard drink    Types: 1 Standard drinks or equivalent per week  . Drug use: No  . Sexual activity: Not on file  Other Topics Concern  . Not on file  Social History Narrative  . Not on file   Social Determinants of Health   Financial Resource Strain:   . Difficulty of  Paying Living Expenses: Not on file  Food Insecurity:   . Worried About Charity fundraiser in the Last Year: Not on file  . Ran Out of Food in the Last Year: Not on file  Transportation Needs:   . Lack of Transportation (Medical): Not on file  . Lack of Transportation (Non-Medical): Not on file  Physical Activity:   . Days of Exercise per Week: Not on file  . Minutes of Exercise per Session: Not on file  Stress:   . Feeling of Stress : Not on file  Social Connections:   . Frequency of Communication with Friends and Family: Not on file  . Frequency of Social Gatherings with Friends and Family: Not on file  . Attends Religious Services: Not on file  . Active Member of Clubs or Organizations: Not on file  . Attends Archivist Meetings: Not on file  . Marital Status: Not on file  Intimate Partner Violence:   . Fear of Current or Ex-Partner: Not on file  . Emotionally Abused: Not on file  . Physically Abused: Not on file  . Sexually Abused: Not on file    Vital Signs: Blood pressure (!) 162/93, pulse 66, resp. rate 16, height 5\' 9"  (1.753 m), weight 206 lb 12.8 oz (93.8 kg), SpO2 96 %.  Examination: General Appearance: The patient is well-developed, well-nourished, and in no distress. Neck Circumference: N/A Skin: Gross inspection of skin unremarkable. Head: normocephalic, no gross deformities. Eyes: no gross deformities noted. ENT: ears appear grossly normal Neurologic: Alert and oriented. No involuntary movements.    EPWORTH SLEEPINESS SCALE:  Scale:  (0)= no chance of dozing; (1)= slight chance of dozing; (2)= moderate chance of dozing; (3)= high chance of dozing  Chance  Situtation    Sitting and reading: 2    Watching TV: 1    Sitting Inactive in public: 1    As a passenger in car: 2      Lying down to rest: 2    Sitting and talking: 1    Sitting quielty after lunch: 2    In a car, stopped in traffic: 0   TOTAL SCORE:   11 out of  24    SLEEP STUDIES:  1. HST 05/2015 REI 57 SpO36min 75%   CPAP COMPLIANCE DATA:  Date Range: 04/02/20-06/30/20  Average Daily Use: 8 hours  Median Use: 8.2  Compliance for > 4 Hours: 100%  AHI: 4.1 respiratory events per hour  Days Used: 42/90 (only has had CPAP for 42 nights)  Mask Leak: 32.1  95th Percentile Pressure: 12.2         LABS: Recent Results (from the past 2160 hour(s))  PSA     Status: None   Collection Time: 06/21/20  9:27 AM  Result Value Ref Range   Prostate Specific  Ag, Serum <0.1 0.0 - 4.0 ng/mL    Comment: Roche ECLIA methodology. According to the American Urological Association, Serum PSA should decrease and remain at undetectable levels after radical prostatectomy. The AUA defines biochemical recurrence as an initial PSA value 0.2 ng/mL or greater followed by a subsequent confirmatory PSA value 0.2 ng/mL or greater. Values obtained with different assay methods or kits cannot be used interchangeably. Results cannot be interpreted as absolute evidence of the presence or absence of malignant disease.     Radiology: CT Hematuria Workup  Result Date: 10/17/2019 CLINICAL DATA:  Painless gross hematuria. Urinary frequency. Previous prostatectomy for prostate carcinoma. Previous appendectomy and inguinal hernia repair. EXAM: CT ABDOMEN AND PELVIS WITHOUT AND WITH CONTRAST TECHNIQUE: Multidetector CT imaging of the abdomen and pelvis was performed following the standard protocol before and following the bolus administration of intravenous contrast. CONTRAST:  186mL OMNIPAQUE IOHEXOL 300 MG/ML  SOLN COMPARISON:  None. FINDINGS: Lower Chest: No acute findings. Hepatobiliary: No hepatic masses identified. Mild diffuse hepatic steatosis noted. Gallbladder is unremarkable. No evidence of biliary ductal dilatation. Pancreas:  No mass or inflammatory changes. Spleen: Within normal limits in size and appearance. Adrenals/Urinary Tract: No adrenal masses  identified. A 3 mm calculus is seen in the upper pole the left kidney. No evidence of ureteral calculi or hydronephrosis. No renal masses identified. No masses seen involving the ureters or bladder. A well-circumscribed fluid collection with thin enhancing rim is seen just anterior to the urinary bladder in the space of Retzius, with linear soft tissue density extending to the umbilicus. This measures 5.6 x 3.3 cm, and differential diagnosis includes urachal cyst or postop fluid collection. Stomach/Bowel: No evidence of obstruction, inflammatory process or abnormal fluid collections. Diverticulosis is seen mainly involving the sigmoid colon, however there is no evidence of diverticulitis. Small periampullary duodenal diverticulum also noted. Vascular/Lymphatic: No pathologically enlarged lymph nodes. No abdominal aortic aneurysm. Aortic atherosclerosis incidentally noted. Reproductive: Postop changes from prior prostatectomy. No evidence of pelvic masses. Other:  None. Musculoskeletal:  No suspicious bone lesions identified. IMPRESSION: 1. 3 mm left renal calculus. No evidence of ureteral calculi or hydronephrosis. 2. No radiographic evidence of urinary tract neoplasm. 3. 5.6 cm well-circumscribed fluid collection in the Space of Retzius, with linear soft tissue density extending to the umbilicus. Differential diagnosis includes urachal cyst or postop fluid collection. 4. Colonic diverticulosis. No radiographic evidence of diverticulitis. 5. Mild hepatic steatosis. Electronically Signed   By: Marlaine Hind M.D.   On: 10/17/2019 13:56   Assessment and Plan: Patient Active Problem List   Diagnosis Date Noted  . Prostate cancer ([place]) 07/09/2017  . HLD (hyperlipidemia) 04/30/2015  . Arthritis, degenerative 04/30/2015  . Elevated glucose 12/02/2014  . Elevated serum creatinine 12/02/2014  . Leg swelling 12/02/2014  . Obesity 12/02/2014  . Preop cardiovascular exam 10/06/2013  . 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 Abnormal prostate specific antigen 07/23/2013  . Benign prostatic hyperplasia with urinary obstruction 07/23/2013  . Excessive urination at night 07/23/2013  . Decreased urine stream 07/23/2013  . FOM (frequency of micturition) 07/23/2013  . Elevated prostate specific antigen (PSA) 07/23/2013  . Slowing of urinary stream 07/23/2013  . HTN (hypertension) 07/26/2011  . Somnolence 07/26/2011  . SOB (shortness of breath) 07/26/2011      The patient does tolerate PAP and reports significant benefit from PAP use. The patient was reminded how to clean the uning and advised to stop using the [place]. He will need a mask fit  appointment. He is using a chin strap.. The smart start was turned on at the patient's request. The patient was also counselled on weight loss. The compliance is excellent. The AHI is 4.1.   1. OSA-patient has been having difficulty tolerating CPAP.  Now is doing much better.  Plan is going to be to continue with the CPAP on the current pressures. 2. CPAP couseling-Discussed importance of adequate CPAP use as well as proper care and cleaning techniques of machine and all supplies. 3. Morbid obesity patient's BMI is 30.54 discussed importance of diet and exercise for adequate control of the patient's symptoms.  General Counseling: I have discussed the findings of the evaluation and examination with Koltan.  I have also discussed any further diagnostic evaluation thatmay be needed or ordered today. Stratton verbalizes understanding of the findings of todays visit. We also reviewed his medications today and discussed drug interactions and side effects including but not limited excessive drowsiness and altered mental states. We also discussed that there is always a risk not just to him but also people around him. he has been encouraged to call the office with any questions or concerns that should arise related to todays visit.  I have personally  obtained a history, examined the patient, evaluated laboratory and imaging results, formulated the assessment and plan and placed orders.  This patient was seen by Theodoro Grist AGNP-C in Collaboration with Dr. Devona Konig as a part of collaborative care agreement.  Richelle Ito Saunders Glance, PhD, FAASM  Diplomate, American Board of Sleep Medicine    Allyne Gee, MD [facility] Diplomate ABMS Pulmonary and Critical Care Medicine Sleep medicine

## 2020-07-20 ENCOUNTER — Other Ambulatory Visit: Payer: Self-pay

## 2020-07-20 ENCOUNTER — Ambulatory Visit (INDEPENDENT_AMBULATORY_CARE_PROVIDER_SITE_OTHER): Payer: Medicare Other | Admitting: Dermatology

## 2020-07-20 ENCOUNTER — Encounter: Payer: Self-pay | Admitting: Dermatology

## 2020-07-20 DIAGNOSIS — D0359 Melanoma in situ of other part of trunk: Secondary | ICD-10-CM | POA: Diagnosis not present

## 2020-07-20 DIAGNOSIS — Z85828 Personal history of other malignant neoplasm of skin: Secondary | ICD-10-CM | POA: Diagnosis not present

## 2020-07-20 DIAGNOSIS — Z86018 Personal history of other benign neoplasm: Secondary | ICD-10-CM

## 2020-07-20 DIAGNOSIS — C44519 Basal cell carcinoma of skin of other part of trunk: Secondary | ICD-10-CM

## 2020-07-20 MED ORDER — MUPIROCIN 2 % EX OINT: 1.0000 "application " | TOPICAL_OINTMENT | Freq: Every day | CUTANEOUS | 1 refills | Status: DC

## 2020-07-20 NOTE — Patient Instructions (Signed)

## 2020-07-20 NOTE — Progress Notes (Signed)
Follow-Up Visit   Subjective  Jerry Welch is a 69 y.o. male who presents for the following: Melanoma IS bx proven (mid to upper back left paraspinal, pt presents for exc).  The following portions of the chart were reviewed this encounter and updated as appropriate:  Tobacco  Allergies  Meds  Problems  Med Hx  Surg Hx  Fam Hx     Review of Systems:  No other skin or systemic complaints except as noted in HPI or Assessment and Plan.  Objective  Well appearing patient in no apparent distress; mood and affect are within normal limits.  A focused examination was performed including back. Relevant physical exam findings are noted in the Assessment and Plan.  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  Skin excision  Lesion length (cm):  1.5 Lesion width (cm):  1 Margin per side (cm):  0.5 Total excision diameter (cm):  2.5 Informed consent: discussed and consent obtained   Timeout: patient name, date of birth, surgical site, and procedure verified   Procedure prep:  Patient was prepped and draped in usual sterile fashion Prep type:  Isopropyl alcohol and povidone-iodine Anesthesia: the lesion was anesthetized in a standard fashion   Anesthetic:  1% lidocaine w/ epinephrine 1-100,000 buffered w/ 8.4% NaHCO3 (16cc) Instrument used: #15 blade   Hemostasis achieved with: suture and pressure   Hemostasis achieved with comment:  Electrocautery Outcome: patient tolerated procedure well with no complications   Post-procedure details: sterile dressing applied and wound care instructions given   Dressing type: bandage and pressure dressing (Mupirocin)    Skin repair Complexity:   Complex Final length (cm):  5 Reason for type of repair: reduce tension to allow closure, reduce the risk of dehiscence, infection, and necrosis, reduce subcutaneous dead space and avoid a hematoma, allow closure of the large defect, preserve normal anatomy, preserve normal anatomical and functional relationships and enhance both functionality and cosmetic results   Undermining: area extensively undermined   Undermining comment:  Undermining Defect 2.5cm Subcutaneous layers (deep stitches):  Suture size:  2-0 Suture type: Vicryl (polyglactin 910)   Subcutaneous suture technique: Inverted Dermal. Fine/surface layer approximation (top stitches):  Suture size:  2-0 Suture type: nylon   Stitches: simple running   Suture removal (days):  7 Hemostasis achieved with: pressure Outcome: patient tolerated procedure well with no complications   Post-procedure details: sterile dressing applied and wound care instructions given   Dressing type: bandage, pressure dressing and bacitracin (Mupirocin)    mupirocin ointment (BACTROBAN) 2 %  Specimen 1 - Surgical pathology Differential Diagnosis: D03.59 Bx proven Melanoma IS Check Margins: yes Pink bx site 1.5 x 1.0cm [id number]  Bx proven  Start Mupirocin oint qd with wound care Start Doryx 50gm 2 po bid with food and drink for 6 days, samples given, Lot [id number] 11/2021  Malignant Melanoma  Discussed diagnosis in detail including significance of melanoma diagnosis which can be potentially lethal.  Discussed treatment recommendations in detail advising that treatment recommendations are based on longitudinal studies and retrospective studies and are nationwide protocols.  Advised there is always potential for recurrence even after definitive treatment.  After definitive  treatment, we recommend total-body skin exams every 3 months for a year; then every 4 months for a year; then every 6 months for 3 years.  At 5 years post treatment, if all looks  good we would recommend at least yearly total-body skin exams for the rest of your life.  The patient was given time for questions and these were answered.  We recommend frequent self skin examinations; photoprotection with sunscreen, sun protective clothing, hats, sunglasses and sun avoidance.  If the patient notices any new or changing skin lesions the patient should return to the office immediately for evaluation.   Doxycycline should be taken with food to prevent nausea. Do not lay down for 30 minutes after taking. Be cautious with sun exposure and use good sun protection while on this medication. Pregnant women should not take this medication.    History of basal cell carcinoma (BCC) Right top of shoulder Bx proven and treated with [facility] 05/17/20 Clear. Observe for recurrence. Call clinic for new or changing lesions.  Recommend regular skin exams, daily broad-spectrum spf 30+ sunscreen use, and photoprotection.     Basal cell carcinoma (BCC) of skin of other part of torso Left inferior scapula Bx proven Plan EDC on f/u  History of dysplastic nevus left mid to upper back 6.0cm lat to spine Bx proven Clear. Observe for recurrence. Call clinic for new or changing lesions.  Recommend regular skin exams, daily broad-spectrum spf 30+ sunscreen use, and photoprotection.     Return in about 1 week (around 07/27/2020) for suture removal, and EDC BCC.   I, [name], RMA, am acting as scribe for Sarina Ser, MD .  Documentation: I have reviewed the above documentation for accuracy and completeness, and I agree with the above.  Sarina Ser, MD

## 2020-07-21 ENCOUNTER — Encounter: Payer: Self-pay | Admitting: Dermatology

## 2020-07-21 ENCOUNTER — Telehealth: Payer: Self-pay

## 2020-07-21 NOTE — Telephone Encounter (Signed)
Patient doing fine after yesterday's surgery.  He has had some mild pain but said Tylenol helped.  Advised pt to call office if any questions/concerns otherwise we would see him at post op appt next week.Mariana Kaufman

## 2020-07-27 ENCOUNTER — Other Ambulatory Visit: Payer: Self-pay

## 2020-07-27 ENCOUNTER — Ambulatory Visit (INDEPENDENT_AMBULATORY_CARE_PROVIDER_SITE_OTHER): Payer: Medicare Other | Admitting: Dermatology

## 2020-07-27 ENCOUNTER — Encounter: Payer: Self-pay | Admitting: Dermatology

## 2020-07-27 DIAGNOSIS — L570 Actinic keratosis: Secondary | ICD-10-CM

## 2020-07-27 DIAGNOSIS — Z4802 Encounter for removal of sutures: Secondary | ICD-10-CM

## 2020-07-27 DIAGNOSIS — C44519 Basal cell carcinoma of skin of other part of trunk: Secondary | ICD-10-CM

## 2020-07-27 DIAGNOSIS — D0359 Melanoma in situ of other part of trunk: Secondary | ICD-10-CM | POA: Diagnosis not present

## 2020-07-27 NOTE — Patient Instructions (Signed)

## 2020-07-27 NOTE — Progress Notes (Signed)
   Follow-Up Visit   Subjective  Jerry Welch is a 69 y.o. male who presents for the following: Melanoma IS margins free (mid to upper back L paraspinal, bx proven, pt presents for s/r), BCC bx proven (L inf scapula, EDC today), and Actinic Keratosis (bil ears, 68m f/u).  The following portions of the chart were reviewed this encounter and updated as appropriate:  Tobacco  Allergies  Meds  Problems  Med Hx  Surg Hx  Fam Hx     Review of Systems:  No other skin or systemic complaints except as noted in HPI or Assessment and Plan.  Objective  Well appearing patient in no apparent distress; mood and affect are within normal limits.  A focused examination was performed including back, ears. Relevant physical exam findings are noted in the Assessment and Plan.  Objective  Mid to upper back left paraspinal: Healing excision site  Objective  Left inferior scapula: Pink bx site  Objective  bil ears: Pink scaly macules    Assessment & Plan    Actinic Damage -chronic - diffuse scaly erythematous macules with underlying dyspigmentation - Recommend daily broad spectrum sunscreen SPF 30+ to sun-exposed areas, reapply every 2 hours as needed.  - Call for new or changing lesions.   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 Discussed pathology results.   Other Related Medications mupirocin ointment (BACTROBAN) 2 %  Basal cell carcinoma (BCC) of skin of other part of torso Left inferior scapula  Destruction of lesion Complexity: extensive   Destruction method: electrodesiccation and curettage   Informed consent: discussed and consent obtained   Timeout:  patient name, date of birth, surgical site, and procedure verified Procedure prep:  Patient was prepped and draped in usual sterile fashion Prep type:   Isopropyl alcohol Anesthesia: the lesion was anesthetized in a standard fashion   Anesthetic:  1% lidocaine w/ epinephrine 1-100,000 buffered w/ 8.4% NaHCO3 Curettage performed in three different directions: Yes   Electrodesiccation performed over the curetted area: Yes   Final wound size (cm):  2.1 Hemostasis achieved with:  pressure, aluminum chloride and electrodesiccation Outcome: patient tolerated procedure well with no complications   Post-procedure details: sterile dressing applied and wound care instructions given   Dressing type: bandage and bacitracin    Bx proven  AK (actinic keratosis) bil ears  Plan Ln2 on f/u  Return for as scheduled for AK f/u and TBSE.  I, [name], RMA, am acting as scribe for Sarina Ser, MD .  Documentation: I have reviewed the above documentation for accuracy and completeness, and I agree with the above.  Sarina Ser, MD

## 2020-08-06 ENCOUNTER — Other Ambulatory Visit: Payer: Self-pay | Admitting: Internal Medicine

## 2020-08-06 ENCOUNTER — Ambulatory Visit: Payer: Medicare Other | Attending: Internal Medicine

## 2020-08-06 DIAGNOSIS — Z23 Encounter for immunization: Secondary | ICD-10-CM

## 2020-08-06 NOTE — Progress Notes (Signed)
   Covid-19 Vaccination Clinic  Name:  Jerry Welch    MRN: [id number] DOB: 1951-07-10  08/06/2020  Jerry Welch was observed post Covid-19 immunization for 15 minutes without incident. He was provided with Vaccine Information Sheet and instruction to access the V-Safe system.   Jerry Welch was instructed to call 911 with any severe reactions post vaccine: Marland Kitchen Difficulty breathing  . Swelling of face and throat  . A fast heartbeat  . A bad rash all over body  . Dizziness and weakness

## 2020-08-10 ENCOUNTER — Other Ambulatory Visit: Payer: Self-pay

## 2020-08-10 ENCOUNTER — Ambulatory Visit (INDEPENDENT_AMBULATORY_CARE_PROVIDER_SITE_OTHER): Payer: Medicare Other | Admitting: Dermatology

## 2020-08-10 DIAGNOSIS — L821 Other seborrheic keratosis: Secondary | ICD-10-CM

## 2020-08-10 DIAGNOSIS — D1800 Hemangioma unspecified site: Secondary | ICD-10-CM

## 2020-08-10 DIAGNOSIS — L814 Other melanin hyperpigmentation: Secondary | ICD-10-CM | POA: Diagnosis not present

## 2020-08-10 DIAGNOSIS — Z85828 Personal history of other malignant neoplasm of skin: Secondary | ICD-10-CM

## 2020-08-10 DIAGNOSIS — D485 Neoplasm of uncertain behavior of skin: Secondary | ICD-10-CM

## 2020-08-10 DIAGNOSIS — L578 Other skin changes due to chronic exposure to nonionizing radiation: Secondary | ICD-10-CM

## 2020-08-10 DIAGNOSIS — L820 Inflamed seborrheic keratosis: Secondary | ICD-10-CM

## 2020-08-10 DIAGNOSIS — Z86006 Personal history of melanoma in-situ: Secondary | ICD-10-CM

## 2020-08-10 DIAGNOSIS — L570 Actinic keratosis: Secondary | ICD-10-CM | POA: Diagnosis not present

## 2020-08-10 DIAGNOSIS — Z1283 Encounter for screening for malignant neoplasm of skin: Secondary | ICD-10-CM

## 2020-08-10 DIAGNOSIS — D225 Melanocytic nevi of trunk: Secondary | ICD-10-CM | POA: Diagnosis not present

## 2020-08-10 DIAGNOSIS — D229 Melanocytic nevi, unspecified: Secondary | ICD-10-CM

## 2020-08-10 NOTE — Progress Notes (Signed)
Follow-Up Visit   Subjective  Jerry Welch is a 69 y.o. male who presents for the following: Annual Exam (History of Melanoma - TBSE today) and Actinic Keratosis (bilateral ears - treat today). The patient presents for Total-Body Skin Exam (TBSE) for skin cancer screening and mole check.  The following portions of the chart were reviewed this encounter and updated as appropriate:  Tobacco  Allergies  Meds  Problems  Med Hx  Surg Hx  Fam Hx     Review of Systems:  No other skin or systemic complaints except as noted in HPI or Assessment and Plan.  Objective  Well appearing patient in no apparent distress; mood and affect are within normal limits.  A full examination was performed including scalp, head, eyes, ears, nose, lips, neck, chest, axillae, abdomen, back, buttocks, bilateral upper extremities, bilateral lower extremities, hands, feet, fingers, toes, fingernails, and toenails. All findings within normal limits unless otherwise noted below.  Objective  Mid to upper back left paraspinal: Well healed excision site. No lymphadenopathy.  Objective  Ears (3): Erythematous thin papules/macules with gritty scale.   Objective  Neck - Anterior (2): Erythematous keratotic or waxy stuck-on papule or plaque.   Objective  Left inf scapula: Well healed scar with no evidence of recurrence.   Objective  Left pubic area: 0.7 cm irregular brown macule   Assessment & Plan    Lentigines - Scattered tan macules - Discussed due to sun exposure - Benign, observe - Call for any changes  Seborrheic Keratoses - Stuck-on, waxy, tan-brown papules and plaques  - Discussed benign etiology and prognosis. - Observe - Call for any changes  Melanocytic Nevi - Tan-brown and/or pink-flesh-colored symmetric macules and papules - Benign appearing on exam today - Observation - Call clinic for new or changing moles - Recommend daily use of broad spectrum spf 30+ sunscreen to  sun-exposed areas.   Hemangiomas - Red papules - Discussed benign nature - Observe - Call for any changes  Actinic Damage - Chronic, secondary to cumulative UV/sun exposure - diffuse scaly erythematous macules with underlying dyspigmentation - Recommend daily broad spectrum sunscreen SPF 30+ to sun-exposed areas, reapply every 2 hours as needed.  - Call for new or changing lesions.  Skin cancer screening performed today.  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  No lymphadenopathy.  Observe for recurrence. Call clinic for new or changing lesions.  Recommend regular skin exams, daily broad-spectrum spf 30+ sunscreen use, and photoprotection.    AK (actinic keratosis) (3) Ears Destruction of lesion - Ears Complexity: simple   Destruction method: cryotherapy   Informed consent: discussed and consent obtained   Timeout:  patient name, date of birth, surgical site, and procedure verified Lesion destroyed using liquid nitrogen: Yes   Region frozen until ice ball extended beyond lesion: Yes   Outcome: patient tolerated procedure well with no complications   Post-procedure details: wound care instructions given    Inflamed seborrheic keratosis (2) Neck - Anterior  Destruction of lesion - Neck - Anterior Complexity: simple   Destruction method: cryotherapy   Informed consent: discussed and consent obtained   Timeout:  patient name, date of birth, surgical site, and procedure verified Lesion destroyed using liquid nitrogen: Yes   Region frozen until ice ball extended beyond lesion: Yes   Outcome: patient tolerated procedure well with no complications   Post-procedure details: wound care instructions given    History of basal cell carcinoma (BCC) Left  inf scapula Clear. Observe for recurrence. Call clinic for new or changing lesions.  Recommend regular skin exams, daily broad-spectrum spf 30+ sunscreen use, and photoprotection.    Neoplasm of  uncertain behavior of skin Left pubic area  Epidermal / dermal shaving  Lesion diameter (cm):  0.7 Informed consent: discussed and consent obtained   Timeout: patient name, date of birth, surgical site, and procedure verified   Procedure prep:  Patient was prepped and draped in usual sterile fashion Prep type:  Isopropyl alcohol Anesthesia: the lesion was anesthetized in a standard fashion   Anesthetic:  1% lidocaine w/ epinephrine 1-100,000 buffered w/ 8.4% NaHCO3 Instrument used: flexible razor blade   Hemostasis achieved with: pressure, aluminum chloride and electrodesiccation   Outcome: patient tolerated procedure well   Post-procedure details: sterile dressing applied and wound care instructions given   Dressing type: bandage and petrolatum    Specimen 1 - Surgical pathology Differential Diagnosis: Nevus vs dysplastic nevus Check Margins: No 0.7 cm irregular brown macule  Return in about 3 months (around 11/10/2020) for TBSE.  I, Ashok Cordia, CMA, am acting as scribe for Sarina Ser, MD .  Documentation: I have reviewed the above documentation for accuracy and completeness, and I agree with the above.  Sarina Ser, MD

## 2020-08-10 NOTE — Patient Instructions (Signed)

## 2020-08-12 ENCOUNTER — Telehealth: Payer: Self-pay

## 2020-08-12 NOTE — Telephone Encounter (Signed)
Left message on voicemail to return my call.  

## 2020-08-12 NOTE — Telephone Encounter (Signed)
-----   Message from Ralene Bathe, MD sent at 08/11/2020  6:14 PM EST ----- Diagnosis Skin , left pubic area DYSPLASTIC COMPOUND NEVUS WITH MILD ATYPIA, LIMITED MARGINS FREE  Dysplastic Mild Recheck at next visit

## 2020-08-12 NOTE — Telephone Encounter (Signed)
Patient advised of biopsy results.

## 2020-08-13 ENCOUNTER — Encounter: Payer: Self-pay | Admitting: Dermatology

## 2020-08-27 ENCOUNTER — Ambulatory Visit: Payer: PRIVATE HEALTH INSURANCE

## 2020-11-18 ENCOUNTER — Ambulatory Visit (INDEPENDENT_AMBULATORY_CARE_PROVIDER_SITE_OTHER): Payer: Medicare Other | Admitting: Dermatology

## 2020-11-18 ENCOUNTER — Other Ambulatory Visit: Payer: Self-pay

## 2020-11-18 ENCOUNTER — Encounter: Payer: Self-pay | Admitting: Dermatology

## 2020-11-18 DIAGNOSIS — D229 Melanocytic nevi, unspecified: Secondary | ICD-10-CM

## 2020-11-18 DIAGNOSIS — Z86006 Personal history of melanoma in-situ: Secondary | ICD-10-CM

## 2020-11-18 DIAGNOSIS — L820 Inflamed seborrheic keratosis: Secondary | ICD-10-CM

## 2020-11-18 DIAGNOSIS — Z1283 Encounter for screening for malignant neoplasm of skin: Secondary | ICD-10-CM

## 2020-11-18 DIAGNOSIS — L821 Other seborrheic keratosis: Secondary | ICD-10-CM

## 2020-11-18 DIAGNOSIS — D1800 Hemangioma unspecified site: Secondary | ICD-10-CM

## 2020-11-18 DIAGNOSIS — Z85828 Personal history of other malignant neoplasm of skin: Secondary | ICD-10-CM | POA: Diagnosis not present

## 2020-11-18 DIAGNOSIS — Z86018 Personal history of other benign neoplasm: Secondary | ICD-10-CM | POA: Diagnosis not present

## 2020-11-18 DIAGNOSIS — D489 Neoplasm of uncertain behavior, unspecified: Secondary | ICD-10-CM

## 2020-11-18 DIAGNOSIS — L814 Other melanin hyperpigmentation: Secondary | ICD-10-CM

## 2020-11-18 DIAGNOSIS — D492 Neoplasm of unspecified behavior of bone, soft tissue, and skin: Secondary | ICD-10-CM

## 2020-11-18 DIAGNOSIS — L578 Other skin changes due to chronic exposure to nonionizing radiation: Secondary | ICD-10-CM

## 2020-11-18 DIAGNOSIS — B078 Other viral warts: Secondary | ICD-10-CM | POA: Diagnosis not present

## 2020-11-18 NOTE — Patient Instructions (Signed)

## 2020-11-18 NOTE — Progress Notes (Signed)
Follow-Up Visit   Subjective  Jerry Welch is a 70 y.o. male who presents for the following: TBSE (Total body exam today. Hx of Melanoma In Situ. Hx of multiple BCC. Hx of multiple mild dysplastic nevi. Pt reports nothing new or changing that he has noticed. ) and Follow-up (3 mo f/u for AKs treated with Ln2, face x 3; ISKs treated with Ln2, neck x 2). The patient presents for Total-Body Skin Exam (TBSE) for skin cancer screening and mole check.  The following portions of the chart were reviewed this encounter and updated as appropriate:  Tobacco  Allergies  Meds  Problems  Med Hx  Surg Hx  Fam Hx     Review of Systems: No other skin or systemic complaints except as noted in HPI or Assessment and Plan.  Objective  Well appearing patient in no apparent distress; mood and affect are within normal limits.  A full examination was performed including scalp, head, eyes, ears, nose, lips, neck, chest, axillae, abdomen, back, buttocks, bilateral upper extremities, bilateral lower extremities, hands, feet, fingers, toes, fingernails, and toenails. All findings within normal limits unless otherwise noted below.  Objective  left chest x 1: Erythematous keratotic or waxy stuck-on papule or plaque.   Objective  right lower eyelid: 0.6 cm verrucous papule  Assessment & Plan  Inflamed seborrheic keratosis left chest x 1 Prior to procedure, discussed risks of blister formation, small wound, skin dyspigmentation, or rare scar following cryotherapy.  Destruction of lesion - left chest x 1 Complexity: simple   Destruction method: cryotherapy   Informed consent: discussed and consent obtained   Timeout:  patient name, date of birth, surgical site, and procedure verified Lesion destroyed using liquid nitrogen: Yes   Region frozen until ice ball extended beyond lesion: Yes   Outcome: patient tolerated procedure well with no complications   Post-procedure details: wound care instructions  given    Neoplasm of uncertain behavior right lower eyelid Skin excision  Total excision diameter (cm):  0.6 Informed consent: discussed and consent obtained   Timeout: patient name, date of birth, surgical site, and procedure verified   Procedure prep:  Patient was prepped and draped in usual sterile fashion Prep type:  Isopropyl alcohol and povidone-iodine Anesthesia: the lesion was anesthetized in a standard fashion   Anesthetic:  1% lidocaine w/ epinephrine 1-100,000 buffered w/ 8.4% NaHCO3 Instrument used: #15 blade   Hemostasis achieved with: pressure   Hemostasis achieved with comment:  Electrocautery Outcome: patient tolerated procedure well with no complications   Post-procedure details: sterile dressing applied and wound care instructions given   Dressing type: bandage and pressure dressing (mupirocin)    Specimen 1 - Surgical pathology Differential Diagnosis: wart vs other  Check Margins: No 0.6 cm verrucous papule  Skin cancer screening   Lentigines - Scattered tan macules - Due to sun exposure - Benign-appering, observe - Recommend daily broad spectrum sunscreen SPF 30+ to sun-exposed areas, reapply every 2 hours as needed. - Call for any changes  Seborrheic Keratoses - Stuck-on, waxy, tan-brown papules and plaques  - Discussed benign etiology and prognosis. - Observe - Call for any changes  Melanocytic Nevi - Tan-brown and/or pink-flesh-colored symmetric macules and papules - Benign appearing on exam today - Observation - Call clinic for new or changing moles - Recommend daily use of broad spectrum spf 30+ sunscreen to sun-exposed areas.   Hemangiomas - Red papules - Discussed benign nature - Observe - Call for any changes  Actinic  Damage - Chronic, secondary to cumulative UV/sun exposure - diffuse scaly erythematous macules with underlying dyspigmentation - Recommend daily broad spectrum sunscreen SPF 30+ to sun-exposed areas, reapply every 2  hours as needed.  - Call for new or changing lesions.  Skin cancer screening performed today.  History of Dysplastic Nevi - No evidence of recurrence today - Recommend regular full body skin exams - Recommend daily broad spectrum sunscreen SPF 30+ to sun-exposed areas, reapply every 2 hours as needed.  - Call if any new or changing lesions are noted between office visits  History of Basal Cell Carcinoma of the Skin - No evidence of recurrence today - Recommend regular full body skin exams - Recommend daily broad spectrum sunscreen SPF 30+ to sun-exposed areas, reapply every 2 hours as needed.  - Call if any new or changing lesions are noted between office visits  History of Melanoma in Situ - No evidence of recurrence today - No lymphadenopathy - Recommend regular full body skin exams - Recommend daily broad spectrum sunscreen SPF 30+ to sun-exposed areas, reapply every 2 hours as needed.  - Call if any new or changing lesions are noted between office visits  Return in about 3 months (around 02/15/2021) for TBSE.   IHarriett Sine, CMA, am acting as scribe for Sarina Ser, MD.  Documentation: I have reviewed the above documentation for accuracy and completeness, and I agree with the above.  Sarina Ser, MD

## 2020-11-20 ENCOUNTER — Encounter: Payer: Self-pay | Admitting: Dermatology

## 2020-11-23 ENCOUNTER — Telehealth: Payer: Self-pay

## 2020-11-23 NOTE — Telephone Encounter (Signed)
Discussed biopsy results with pt  °

## 2020-11-23 NOTE — Telephone Encounter (Signed)
-----   Message from Ralene Bathe, MD sent at 11/20/2020 10:56 AM EST ----- Diagnosis Skin , right lower eyelid VERRUCA VULGARIS, IRRITATED  Benign viral wart May recur observe

## 2020-12-02 NOTE — Progress Notes (Signed)
Cardiology Office Note  Date:  12/03/2020   ID:  Jerry Welch, DOB 1951-05-22, MRN [id number]  PCP:  [name], MD   Chief Complaint  Patient presents with  . 12 month follow up     "doing well." Patient c/o LE edema. Medications reviewed by the patient verbally.     HPI:  Jerry Welch  is a pleasant 70 year old gentleman with history of obesity,  hypertension,  migraines  osteoarthritis  Smoked for 20 yr, until 1982  prostate cancer , resection 06/2017 who presents for follow up of his hypertension,  and shortness of breath.  LOV 11/2019 On past clinic visit a chronic leg swelling, did not wear compression hose, Was retired, works on cars Sedentary, no regular exercise, weight stable Blood pressure well controlled on verapamil, valsartan HCTZ, Prostate cancer development, had prostatectomy laparoscopic  Leg swelling felt to be exacerbated by verapamil Swelling Worse on the left leg than the right, this is been a chronic issue  Receiving work-up for oligoarthritis, hands Seen by rheumatology, Dr. Posey Pronto  Followed for GERD symptoms by Dr. Alice Reichert  Still having chronic cramps, Thinks it is low potassium Takes 2 potassium pills a day  Interested in changing medications  Lab work reviewed,  recent PSA is undetectable Total cholesterol 167 LDL 88 in May 2021 No recent A1c available  EKG personally reviewed by myself on todays visit Shows normal sinus rhythm with rate 71 bpm no significant ST or T wave changes  Other past medical history History of severe back pain.  trouble started 30 years ago with a bulge disc.   ultrasound September 2013 showing hepatic steatohepatitis,      PMH:   has a past medical history of Back pain, Basal cell carcinoma (05/17/2020), Basal cell carcinoma (05/17/2020), Dysplastic nevus (05/17/2020), Dysplastic nevus (08/10/2020), GERD (gastroesophageal reflux disease), History of hiatal hernia, HLD (hyperlipidemia) (04/30/2015),  Hypertension, Melanoma ([place]) (05/17/2020), Osteoarthrosis involving multiple sites, Prostate cancer ([place]) (2019), Ruptured lumbar disc, and Sleep apnea.  PSH:    Past Surgical History:  Procedure Laterality Date  . APPENDECTOMY    . BACK SURGERY  09/2013   L4-L5   . BASAL CELL CARCINOMA EXCISION  2012   right neck   . PELVIC LYMPH NODE DISSECTION Bilateral 07/09/2017   Procedure: PELVIC LYMPH NODE DISSECTION;  Surgeon: Hollice Espy, MD;  Location: [facility] ORS;  Service: Urology;  Laterality: Bilateral;  . PROSTATE BIOPSY    . 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 REPLACEMENT TOTAL KNEE     right x 2  . ROBOT ASSISTED LAPAROSCOPIC RADICAL PROSTATECTOMY N/A 07/09/2017   Procedure: ROBOTIC ASSISTED LAPAROSCOPIC RADICAL PROSTATECTOMY;  Surgeon: Hollice Espy, MD;  Location: [facility] ORS;  Service: Urology;  Laterality: N/A;    Current Outpatient Medications  Medication Sig Dispense Refill  . cyanocobalamin (,VITAMIN B-12,) 1000 MCG/ML injection Inject 1,000 mcg into the muscle every 14 (fourteen) days.     Marland Kitchen latanoprost (XALATAN) 0.005 % ophthalmic solution Apply to eye.    . mupirocin ointment (BACTROBAN) 2 % Apply 1 application topically daily. Qd to excision site 22 g 1  . naproxen sodium (ANAPROX) 220 MG tablet Take 440 mg by mouth daily.    Marland Kitchen omeprazole (PRILOSEC) 40 MG capsule Take 40 mg by mouth daily.     Marland Kitchen POTASSIUM CHLORIDE ER PO Take 40 mEq by mouth daily.     . valsartan-hydrochlorothiazide (DIOVAN-HCT) 320-25 MG tablet   10  . verapamil (COVERA HS)  240 MG (CO) 24 hr tablet Take 240 mg by mouth 2 (two) times daily.     No current facility-administered medications for this visit.     Allergies:   Amlodipine and Lisinopril   Social History:  The patient  reports that he quit smoking about 39 years ago. His smoking use included cigarettes. He smoked 1.00 pack per day. He has quit using smokeless tobacco. He reports current alcohol use of about 1.0 standard drink of  alcohol per week. He reports that he does not use drugs.   Family History:   family history includes Heart attack in his maternal grandfather; Heart failure in his mother.    Review of Systems: Review of Systems  Constitutional: Negative.   HENT: Negative.   Respiratory: Negative.   Cardiovascular: Negative.   Gastrointestinal: Negative.   Musculoskeletal: Positive for myalgias.  Neurological: Negative.   Psychiatric/Behavioral: Negative.   All other systems reviewed and are negative.   PHYSICAL EXAM: VS:  BP (!) 144/100 (BP Location: Left Arm, Patient Position: Sitting, Cuff Size: Normal)   Pulse 71   Ht 5\' 8"  (1.727 m)   Wt 214 lb 6 oz (97.2 kg)   SpO2 98%   BMI 32.60 kg/m  , BMI Body mass index is 32.6 kg/m. Constitutional:  oriented to person, place, and time. No distress.  HENT:  Head: Grossly normal Eyes:  no discharge. No scleral icterus.  Neck: No JVD, no carotid bruits  Cardiovascular: Regular rate and rhythm, no murmurs appreciated Pulmonary/Chest: Clear to auscultation bilaterally, no wheezes or rails Abdominal: Soft.  no distension.  no tenderness.  Musculoskeletal: Normal range of motion Neurological:  normal muscle tone. Coordination normal. No atrophy Skin: Skin warm and dry Psychiatric: normal affect, pleasant  Recent Labs: No results found for requested labs within last 8760 hours.    Lipid Panel No results found for: CHOL, HDL, LDLCALC, TRIG    Wt Readings from Last 3 Encounters:  12/03/20 214 lb 6 oz (97.2 kg)  07/05/20 206 lb 12.8 oz (93.8 kg)  06/29/20 203 lb 1.6 oz (92.1 kg)     ASSESSMENT AND PLAN:  Essential hypertension We will stop the valsartan HCTZ given his significant cramping Start valsartan 320 daily Hold the potassium BMP in 2 weeks time Start Cardura 2 mg daily in place of HCTZ  Mixed hyperlipidemia Numbers well controlled on no statin Will not add any statin given leg cramping We have previously discussed risk  stratification with calcium scoring  Leg swelling Compression hose, likely exacerbated by verapamil, not swelling today  Class 2 obesity due to excess calories without serious comorbidity with body mass index (BMI) of 35.0 to 35.9 in adult We have encouraged continued exercise, careful diet management in an effort to lose weight.    Total encounter time more than 25 minutes  Greater than 50% was spent in counseling and coordination of care with the patient   Disposition:   F/U 12 months   Orders Placed This Encounter  Procedures  . EKG 12-Lead     Signed, Esmond Plants, M.D., Ph.D. 12/03/2020  [place], [place]

## 2020-12-03 ENCOUNTER — Other Ambulatory Visit: Payer: Self-pay

## 2020-12-03 ENCOUNTER — Encounter: Payer: Self-pay | Admitting: Cardiovascular Disease

## 2020-12-03 ENCOUNTER — Ambulatory Visit (INDEPENDENT_AMBULATORY_CARE_PROVIDER_SITE_OTHER): Payer: Medicare Other | Admitting: Cardiovascular Disease

## 2020-12-03 VITALS — BP 144/100 | HR 71 | Ht 68.0 in | Wt 214.4 lb

## 2020-12-03 DIAGNOSIS — I10 Essential (primary) hypertension: Secondary | ICD-10-CM | POA: Diagnosis not present

## 2020-12-03 DIAGNOSIS — M7989 Other specified soft tissue disorders: Secondary | ICD-10-CM

## 2020-12-03 DIAGNOSIS — E782 Mixed hyperlipidemia: Secondary | ICD-10-CM

## 2020-12-03 DIAGNOSIS — R0602 Shortness of breath: Secondary | ICD-10-CM | POA: Diagnosis not present

## 2020-12-03 MED ORDER — DOXAZOSIN MESYLATE 2 MG PO TABS: 2.0000 mg | ORAL_TABLET | Freq: Every day | ORAL | 3 refills | Status: DC

## 2020-12-03 MED ORDER — VALSARTAN 320 MG PO TABS: 320.0000 mg | ORAL_TABLET | Freq: Every day | ORAL | 3 refills | Status: DC

## 2020-12-03 NOTE — Patient Instructions (Addendum)
Medication Instructions:  LETS GET RID OF THE CRAMPS!  STOP  Valsartan HCTZ  Potassium  START  Valsartan 320 mg daily  Cardura 2 mg daily  Lab work: BMP (2 weeks)  Walk into medical mall at the check in desk, they will direct you to lab registration, hours for labs are Monday-Friday 07:00am-5:30pm (no appointment necessary)   Testing/Procedures: No new testing needed   Follow-Up:  . You will need a follow up appointment in 12 months  . Providers on your designated Care Team:   . Murray Hodgkins, NP . Christell Faith, PA-C . Marrianne Mood, PA-C  Any Other Special Instructions Will Be Listed Below (If Applicable).  Please monitor blood pressures and keep a log of your readings.   How to use a home blood pressure monitor. . Be still. . Measure at the same time every day. It's important to take the readings at the same time each day, such as morning and evening. Take reading approximately 1 1/2 to 2 hours after BP medications.   AVOID these things for 30 minutes before checking your blood pressure:  Drinking caffeine.  Drinking alcohol.  Eating.  Smoking.  Exercising.   Five minutes before checking your blood pressure:  Pee.  Sit in a dining chair. Avoid sitting in a soft couch or armchair.  Be quiet. Do not talk.       Sit correctly. Sit with your back straight and supported (on a dining chair, rather than a sofa). Your feet should be flat on the floor and your legs should not be crossed. Your arm should be supported on a flat surface (such as a table) with the upper arm at heart level. Make sure the bottom of the cuff is placed directly above the bend of the elbow.

## 2020-12-09 ENCOUNTER — Telehealth: Payer: Self-pay | Admitting: Cardiovascular Disease

## 2020-12-09 MED ORDER — HYDROCHLOROTHIAZIDE 25 MG PO TABS: 25.0000 mg | ORAL_TABLET | ORAL | 0 refills | Status: DC

## 2020-12-09 MED ORDER — DOXAZOSIN MESYLATE 2 MG PO TABS: 2.0000 mg | ORAL_TABLET | Freq: Two times a day (BID) | ORAL | 3 refills | Status: DC

## 2020-12-09 NOTE — Telephone Encounter (Signed)
Spoke with patient and his blood pressures have persistently increased until now he is very concerned.  Symptoms:  Headache Blurred vision Feeling draggy  He mentioned that If he needs to go back to previous medication he will because now he can't function. Advised that I would send message to provider for review and recommendations and would reach back out to him once I hear back. He verbalized understanding with no further questions at this time.   Last visit:   STOP  Valsartan HCTZ  Potassium  START  Valsartan 320 mg daily  Cardura 2 mg daily

## 2020-12-09 NOTE — Addendum Note (Signed)
Addended by: Valora Corporal on: 12/09/2020 02:41 PM   Modules accepted: Orders

## 2020-12-09 NOTE — Telephone Encounter (Signed)
Spoke with patient and reviewed recommendations to   1. Increase Cardura (Doxazosin) to 2 mg twice daily.  2. Restart HCTZ 25 mg every other day  Reviewed to continue monitoring blood pressures and to call us if they remain greater than 140/80. He verbalized understanding of our conversation, agreement with plan, and had no further questions at this time. Sent my chart message with current medications and to let us know if this has changed.

## 2020-12-09 NOTE — Telephone Encounter (Signed)
Would restart HCTZ 25 mg every other day We will avoid every day so we can get rid of his cramping  Increase Cardura up to 2 mg twice daily If blood pressure continues to run high into early next week, We can slowly increase Cardura, 3 mg twice daily

## 2020-12-09 NOTE — Telephone Encounter (Signed)
Pt c/o BP issue: STAT if pt c/o blurred vision, one-sided weakness or slurred speech  1. What are your last 5 BP readings?  Monday 165/95 Tuesday 173/100 Today 180/108  2. Are you having any other symptoms (ex. Dizziness, headache, blurred vision, passed out)? Headache, a little dizzy  3. What is your BP issue? BP is steadily going up.  Would like to discuss what to do.  Please call.

## 2020-12-09 NOTE — Telephone Encounter (Signed)
Left voicemail message to call back for review of concerns.

## 2020-12-27 ENCOUNTER — Other Ambulatory Visit: Payer: Self-pay

## 2020-12-27 DIAGNOSIS — C61 Malignant neoplasm of prostate: Secondary | ICD-10-CM

## 2020-12-28 ENCOUNTER — Encounter: Payer: Self-pay | Admitting: Urology

## 2020-12-28 ENCOUNTER — Other Ambulatory Visit: Payer: Medicare Other

## 2021-01-05 ENCOUNTER — Other Ambulatory Visit: Payer: Medicare Other

## 2021-01-05 ENCOUNTER — Other Ambulatory Visit: Payer: Self-pay

## 2021-01-05 DIAGNOSIS — C61 Malignant neoplasm of prostate: Secondary | ICD-10-CM

## 2021-01-06 LAB — PSA: Prostate Specific Ag, Serum: <0.1 ng/mL (ref 0.0–4.0)

## 2021-01-11 ENCOUNTER — Telehealth: Payer: Self-pay | Admitting: Urology

## 2021-01-11 DIAGNOSIS — C61 Malignant neoplasm of prostate: Secondary | ICD-10-CM

## 2021-01-11 NOTE — Telephone Encounter (Signed)
Would you let Jerry Welch know that his PSA remains undetectable and that we need to see him again in 6 months with PSA prior?  I've already tried to contact him via MyChart and he didn't respond.

## 2021-01-12 NOTE — Telephone Encounter (Signed)
Spoke with patient and advised results Lab appt scheduled Office appt scheduled

## 2021-02-17 ENCOUNTER — Ambulatory Visit (INDEPENDENT_AMBULATORY_CARE_PROVIDER_SITE_OTHER): Payer: Medicare Other | Admitting: Dermatology

## 2021-02-17 ENCOUNTER — Other Ambulatory Visit: Payer: Self-pay

## 2021-02-17 DIAGNOSIS — C4441 Basal cell carcinoma of skin of scalp and neck: Secondary | ICD-10-CM | POA: Diagnosis not present

## 2021-02-17 DIAGNOSIS — D692 Other nonthrombocytopenic purpura: Secondary | ICD-10-CM

## 2021-02-17 DIAGNOSIS — Z85828 Personal history of other malignant neoplasm of skin: Secondary | ICD-10-CM | POA: Diagnosis not present

## 2021-02-17 DIAGNOSIS — L570 Actinic keratosis: Secondary | ICD-10-CM | POA: Diagnosis not present

## 2021-02-17 DIAGNOSIS — L820 Inflamed seborrheic keratosis: Secondary | ICD-10-CM

## 2021-02-17 DIAGNOSIS — L578 Other skin changes due to chronic exposure to nonionizing radiation: Secondary | ICD-10-CM | POA: Diagnosis not present

## 2021-02-17 DIAGNOSIS — D229 Melanocytic nevi, unspecified: Secondary | ICD-10-CM

## 2021-02-17 DIAGNOSIS — D1800 Hemangioma unspecified site: Secondary | ICD-10-CM

## 2021-02-17 DIAGNOSIS — Z86018 Personal history of other benign neoplasm: Secondary | ICD-10-CM

## 2021-02-17 DIAGNOSIS — Z86006 Personal history of melanoma in-situ: Secondary | ICD-10-CM | POA: Diagnosis not present

## 2021-02-17 DIAGNOSIS — L814 Other melanin hyperpigmentation: Secondary | ICD-10-CM

## 2021-02-17 DIAGNOSIS — L821 Other seborrheic keratosis: Secondary | ICD-10-CM

## 2021-02-17 DIAGNOSIS — L918 Other hypertrophic disorders of the skin: Secondary | ICD-10-CM

## 2021-02-17 DIAGNOSIS — D485 Neoplasm of uncertain behavior of skin: Secondary | ICD-10-CM

## 2021-02-17 DIAGNOSIS — Z1283 Encounter for screening for malignant neoplasm of skin: Secondary | ICD-10-CM

## 2021-02-17 NOTE — Progress Notes (Signed)
Follow-Up Visit   Subjective  Jerry Welch is a 70 y.o. 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 He had a TBSE 3 months ago and just prefers an UBSE today. He just has a couple of spots that bother him on his left upper eyelid and left postauricular.). The patient presents for Upper Body Skin Exam (UBSE) for skin cancer screening and mole check.  The following portions of the chart were reviewed this encounter and updated as appropriate:   Tobacco  Allergies  Meds  Problems  Med Hx  Surg Hx  Fam Hx     Review of Systems:  No other skin or systemic complaints except as noted in HPI or Assessment and Plan.  Objective  Well appearing patient in no apparent distress; mood and affect are within normal limits.  All skin waist up examined.  Objective  Left Upper Eyelid Margin: Erythematous keratotic or waxy stuck-on papule or plaque.   Objective  Left neck postauricular: 1.1 cm indurated papule  Objective  Hands, ears (8): Erythematous thin papules/macules with gritty scale.    Assessment & Plan    Lentigines - Scattered tan macules - Due to sun exposure - Benign-appering, observe - Recommend daily broad spectrum sunscreen SPF 30+ to sun-exposed areas, reapply every 2 hours as needed. - Call for any changes  Seborrheic Keratoses - Stuck-on, waxy, tan-brown papules and/or plaques  - Benign-appearing - Discussed benign etiology and prognosis. - Observe - Call for any changes  Melanocytic Nevi - Tan-brown and/or pink-flesh-colored symmetric macules and papules - Benign appearing on exam today - Observation - Call clinic for new or changing moles - Recommend daily use of broad spectrum spf 30+ sunscreen to sun-exposed areas.   Hemangiomas - Red papules - Discussed benign nature - Observe - Call for any changes  Actinic Damage - Chronic condition, secondary to cumulative UV/sun  exposure - diffuse scaly erythematous macules with underlying dyspigmentation - Recommend daily broad spectrum sunscreen SPF 30+ to sun-exposed areas, reapply every 2 hours as needed.  - Staying in the shade or wearing long sleeves, sun glasses (UVA+UVB protection) and wide brim hats (4-inch brim around the entire circumference of the hat) are also recommended for sun protection.  - Call for new or changing lesions.  Skin cancer screening performed today.  Purpura - Chronic; persistent and recurrent.  Treatable, but not curable. - Violaceous macules and patches - Benign - Related to trauma, age, sun damage and/or use of blood thinners, chronic use of topical and/or oral steroids - Observe - Can use OTC arnica containing moisturizer such as Dermend Bruise Formula if desired - Call for worsening or other concerns  Acrochordons (Skin Tags) - Fleshy, skin-colored pedunculated papules - Benign appearing.  - Observe. - If desired, they can be removed with an in office procedure that is not covered by insurance. - Please call the clinic if you notice any new or changing lesions.  Inflamed seborrheic keratosis Left Upper Eyelid Margin  Destruction of lesion - Left Upper Eyelid Margin Complexity: simple   Destruction method: cryotherapy   Informed consent: discussed and consent obtained   Timeout:  patient name, date of birth, surgical site, and procedure verified Lesion destroyed using liquid nitrogen: Yes   Region frozen until ice ball extended beyond lesion: Yes   Outcome: patient tolerated procedure well with no complications   Post-procedure details: wound care instructions given  Neoplasm of uncertain behavior of skin Left neck postauricular  Epidermal / dermal shaving  Lesion diameter (cm):  0.6 Informed consent: discussed and consent obtained   Timeout: patient name, date of birth, surgical site, and procedure verified   Procedure prep:  Patient was prepped and draped in  usual sterile fashion Prep type:  Isopropyl alcohol Anesthesia: the lesion was anesthetized in a standard fashion   Anesthetic:  1% lidocaine w/ epinephrine 1-100,000 buffered w/ 8.4% NaHCO3 Instrument used: flexible razor blade   Hemostasis achieved with: pressure, aluminum chloride and electrodesiccation   Outcome: patient tolerated procedure well   Post-procedure details: sterile dressing applied and wound care instructions given   Dressing type: bandage and petrolatum    Destruction of lesion Complexity: extensive   Destruction method: electrodesiccation and curettage   Informed consent: discussed and consent obtained   Timeout:  patient name, date of birth, surgical site, and procedure verified Procedure prep:  Patient was prepped and draped in usual sterile fashion Prep type:  Isopropyl alcohol Anesthesia: the lesion was anesthetized in a standard fashion   Anesthetic:  1% lidocaine w/ epinephrine 1-100,000 buffered w/ 8.4% NaHCO3 Curettage performed in three different directions: Yes   Electrodesiccation performed over the curetted area: Yes   Lesion length (cm):  0.6 Lesion width (cm):  0.6 Margin per side (cm):  0.2 Final wound size (cm):  1 Hemostasis achieved with:  pressure and aluminum chloride Outcome: patient tolerated procedure well with no complications   Post-procedure details: sterile dressing applied and wound care instructions given   Dressing type: bandage and petrolatum    Specimen 1 - Surgical pathology Differential Diagnosis: BCC vs other  Check Margins: No 1.1 cm indurated papule EDC today  AK (actinic keratosis) (8) Hands, ears  Destruction of lesion - Hands, ears Complexity: simple   Destruction method: cryotherapy   Informed consent: discussed and consent obtained   Timeout:  patient name, date of birth, surgical site, and procedure verified Lesion destroyed using liquid nitrogen: Yes   Region frozen until ice ball extended beyond lesion: Yes    Outcome: patient tolerated procedure well with no complications   Post-procedure details: wound care instructions given    Return in about 3 months (around 05/20/2021).   I, Ashok Cordia, CMA, am acting as scribe for Sarina Ser, MD .  Documentation: I have reviewed the above documentation for accuracy and completeness, and I agree with the above.  Sarina Ser, MD

## 2021-02-17 NOTE — Patient Instructions (Signed)

## 2021-02-21 ENCOUNTER — Encounter: Payer: Self-pay | Admitting: Dermatology

## 2021-02-24 ENCOUNTER — Telehealth: Payer: Self-pay

## 2021-02-24 NOTE — Telephone Encounter (Signed)
-----   Message from Ralene Bathe, MD sent at 02/24/2021  4:51 PM EDT ----- Diagnosis Skin , Left neck postauricular BASAL CELL CARCINOMA, NODULAR AND INFILTRATIVE PATTERNS, BASE INVOLVED  Cancer - BCC Already treated Recheck next visit

## 2021-02-24 NOTE — Telephone Encounter (Signed)
Patient informed of pathology results 

## 2021-03-15 ENCOUNTER — Other Ambulatory Visit: Payer: Self-pay | Admitting: Cardiovascular Disease

## 2021-06-02 ENCOUNTER — Other Ambulatory Visit: Payer: Self-pay

## 2021-06-02 ENCOUNTER — Ambulatory Visit (INDEPENDENT_AMBULATORY_CARE_PROVIDER_SITE_OTHER): Payer: Medicare Other | Admitting: Dermatology

## 2021-06-02 DIAGNOSIS — L578 Other skin changes due to chronic exposure to nonionizing radiation: Secondary | ICD-10-CM

## 2021-06-02 DIAGNOSIS — L570 Actinic keratosis: Secondary | ICD-10-CM

## 2021-06-02 DIAGNOSIS — L821 Other seborrheic keratosis: Secondary | ICD-10-CM

## 2021-06-02 DIAGNOSIS — L820 Inflamed seborrheic keratosis: Secondary | ICD-10-CM

## 2021-06-02 NOTE — Patient Instructions (Signed)
Actinic keratoses are precancerous spots that appear secondary to cumulative UV radiation exposure/sun exposure over time. They are chronic with expected duration over 1 year. A portion of actinic keratoses will progress to squamous cell carcinoma of the skin. It is not possible to reliably predict which spots will progress to skin cancer and so treatment is recommended to prevent development of skin cancer.  Recommend daily broad spectrum sunscreen SPF 30+ to sun-exposed areas, reapply every 2 hours as needed.  Recommend staying in the shade or wearing long sleeves, sun glasses (UVA+UVB protection) and wide brim hats (4-inch brim around the entire circumference of the hat). Call for new or changing lesions.   Cryotherapy Aftercare  Wash gently with soap and water everyday.   Apply Vaseline and Band-Aid daily until healed.   If you have any questions or concerns for your doctor, please call our main line at [phone] and press option 4 to reach your doctor's medical assistant. If no one answers, please leave a voicemail as directed and we will return your call as soon as possible. Messages left after 4 pm will be answered the following business day.   You may also send us a message via MyChart. We typically respond to MyChart messages within 1-2 business days.  For prescription refills, please ask your pharmacy to contact our office. Our fax number is [phone].  If you have an urgent issue when the clinic is closed that cannot wait until the next business day, you can page your doctor at the number below.    Please note that while we do our best to be available for urgent issues outside of office hours, we are not available 24/7.   If you have an urgent issue and are unable to reach us, you may choose to seek medical care at your doctor's office, retail clinic, urgent care center, or emergency room.  If you have a medical emergency, please immediately call 911 or go to the emergency  department.  Pager Numbers  - Dr. Kowalski: [phone]  - Dr. Moye: [phone]  - Dr. Stewart: [phone]  In the event of inclement weather, please call our main line at [phone] for an update on the status of any delays or closures.  Dermatology Medication Tips: Please keep the boxes that topical medications come in in order to help keep track of the instructions about where and how to use these. Pharmacies typically print the medication instructions only on the boxes and not directly on the medication tubes.   If your medication is too expensive, please contact our office at [phone] option 4 or send us a message through MyChart.   We are unable to tell what your co-pay for medications will be in advance as this is different depending on your insurance coverage. However, we may be able to find a substitute medication at lower cost or fill out paperwork to get insurance to cover a needed medication.   If a prior authorization is required to get your medication covered by your insurance company, please allow us 1-2 business days to complete this process.  Drug prices often vary depending on where the prescription is filled and some pharmacies may offer cheaper prices.  The website www.goodrx.com contains coupons for medications through different pharmacies. The prices here do not account for what the cost may be with help from insurance (it may be cheaper with your insurance), but the website can give you the price if you did not use any insurance.  - You   can print the associated coupon and take it with your prescription to the pharmacy.  - You may also stop by our office during regular business hours and pick up a GoodRx coupon card.  - If you need your prescription sent electronically to a different pharmacy, notify our office through [facility] MyChart or by phone at [phone] option 4.  

## 2021-06-02 NOTE — Progress Notes (Signed)
Follow-Up Visit   Subjective  Jerry Welch is a 70 y.o. male who presents for the following: Follow-up (Patient here today for 3 month follow up. Patient has history of aks and isk. He reports no new spots of concern. ).  The following portions of the chart were reviewed this encounter and updated as appropriate:  Tobacco  Allergies  Meds  Problems  Med Hx  Surg Hx  Fam Hx      Objective  Well appearing patient in no apparent distress; mood and affect are within normal limits.  A focused examination was performed including face, ears, forearms. Relevant physical exam findings are noted in the Assessment and Plan.  face and ears x 6 (6) Erythematous thin papules/macules with gritty scale.   left forearm x 1 Erythematous keratotic or waxy stuck-on papule or plaque.   Assessment & Plan  Actinic keratosis (6) face and ears x 6  Actinic keratoses are precancerous spots that appear secondary to cumulative UV radiation exposure/sun exposure over time. They are chronic with expected duration over 1 year. A portion of actinic keratoses will progress to squamous cell carcinoma of the skin. It is not possible to reliably predict which spots will progress to skin cancer and so treatment is recommended to prevent development of skin cancer.  Recommend daily broad spectrum sunscreen SPF 30+ to sun-exposed areas, reapply every 2 hours as needed.  Recommend staying in the shade or wearing long sleeves, sun glasses (UVA+UVB protection) and wide brim hats (4-inch brim around the entire circumference of the hat). Call for new or changing lesions.  May consider in future  Actinic Damage - Severe, confluent actinic changes with pre-cancerous actinic keratoses  - Severe, chronic, not at goal, secondary to cumulative UV radiation exposure over time - diffuse scaly erythematous macules and papules with underlying dyspigmentation - Discussed Prescription "Field Treatment" for Severe, Chronic  Confluent Actinic Changes with Pre-Cancerous Actinic Keratoses Field treatment involves treatment of an entire area of skin that has confluent Actinic Changes (Sun/ Ultraviolet light damage) and PreCancerous Actinic Keratoses by method of PhotoDynamic Therapy (PDT) and/or prescription Topical Chemotherapy agents such as 5-fluorouracil, 5-fluorouracil/calcipotriene, and/or imiquimod.  The purpose is to decrease the number of clinically evident and subclinical PreCancerous lesions to prevent progression to development of skin cancer by chemically destroying early precancer changes that may or may not be visible.  It has been shown to reduce the risk of developing skin cancer in the treated area. As a result of treatment, redness, scaling, crusting, and open sores may occur during treatment course. One or more than one of these methods may be used and may have to be used several times to control, suppress and eliminate the PreCancerous changes. Discussed treatment course, expected reaction, and possible side effects. - Recommend daily broad spectrum sunscreen SPF 30+ to sun-exposed areas, reapply every 2 hours as needed.  - Staying in the shade or wearing long sleeves, sun glasses (UVA+UVB protection) and wide brim hats (4-inch brim around the entire circumference of the hat) are also recommended. - Call for new or changing lesions.   Destruction of lesion - face and ears x 6 Complexity: simple   Destruction method: cryotherapy   Informed consent: discussed and consent obtained   Timeout:  patient name, date of birth, surgical site, and procedure verified Lesion destroyed using liquid nitrogen: Yes   Region frozen until ice ball extended beyond lesion: Yes   Cryotherapy cycles:  2 Outcome: patient tolerated procedure well with  no complications   Post-procedure details: wound care instructions given    Inflamed seborrheic keratosis left forearm x 1  Destruction of lesion - left forearm x  1 Complexity: simple   Destruction method: cryotherapy   Informed consent: discussed and consent obtained   Timeout:  patient name, date of birth, surgical site, and procedure verified Lesion destroyed using liquid nitrogen: Yes   Region frozen until ice ball extended beyond lesion: Yes   Outcome: patient tolerated procedure well with no complications   Post-procedure details: wound care instructions given    Seborrheic Keratoses - Stuck-on, waxy, tan-brown papules and/or plaques  - Benign-appearing - Discussed benign etiology and prognosis. - Observe - Call for any changes  Actinic Damage - chronic, secondary to cumulative UV radiation exposure/sun exposure over time - diffuse scaly erythematous macules with underlying dyspigmentation - Recommend daily broad spectrum sunscreen SPF 30+ to sun-exposed areas, reapply every 2 hours as needed.  - Recommend staying in the shade or wearing long sleeves, sun glasses (UVA+UVB protection) and wide brim hats (4-inch brim around the entire circumference of the hat). - Call for new or changing lesions.  Return in about 6 months (around 11/30/2021) for ak followup and tbse . IRuthell Rummage, CMA, am acting as scribe for Sarina Ser, MD.  Documentation: I have reviewed the above documentation for accuracy and completeness, and I agree with the above.  Sarina Ser, MD

## 2021-06-07 ENCOUNTER — Other Ambulatory Visit: Payer: Self-pay

## 2021-06-07 ENCOUNTER — Other Ambulatory Visit: Payer: Medicare Other

## 2021-06-07 ENCOUNTER — Encounter: Payer: Self-pay | Admitting: Dermatology

## 2021-06-07 DIAGNOSIS — C61 Malignant neoplasm of prostate: Secondary | ICD-10-CM

## 2021-06-08 LAB — PSA: Prostate Specific Ag, Serum: <0.1 ng/mL (ref 0.0–4.0)

## 2021-06-13 NOTE — Progress Notes (Signed)
06/14/2021 2:52 PM   Jerry Welch 1951-05-07 [id number]  Referring provider: [name], MD [address] [place],   [address]  Urological history: 1. Prostate cancer -PSA <0.1 in 05/2021 -robotic prostatectomy with Dr. Erlene Quan in 06/2017.  Surgical pathology consistent with Gleason 3+4.  There was evidence of extraprostatic extension.  Negative margins.  Negative lymph nodes.  Negative seminal vesicles. pT3a N0.  2. High risk hematuria -former smoker -CTU 09/2019 3 mm left renal calculus. No evidence of ureteral calculi or hydronephrosis.  No radiographic evidence of urinary tract neoplasm.  5.6 cm well-circumscribed fluid collection in the Space of [place], with linear soft tissue density extending to the umbilicus.  Differential diagnosis includes urachal cyst or postop fluid collection.  Colonic diverticulosis. No radiographic evidence of diverticulitis.  Mild hepatic steatosis -Cysto unremarkable with Dr. Erlene Quan in 09/2019 -no reports of gross hematuria -UA negative for micro heme  3. SUI -contributing factors of age, pelvic surgery, sleep apnea, obesity and diuretic  Chief Complaint  Patient presents with   Prostate Cancer    HPI: Jerry Welch is a 70 y.o. male who presents today for follow up.    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  Patient denies any modifying or aggravating factors.  Patient denies any gross hematuria, dysuria or suprapubic/flank pain.  Patient denies any fevers, chills, nausea or vomiting.    He is having issues with night sweats and fatigue.    PMH: Past Medical History:  Diagnosis Date   Back pain    Basal cell carcinoma 05/17/2020   Right top of shoulder. Nodular pattern. EDC   Basal cell carcinoma 05/17/2020   Left inferior scapula. Nodular pattern. [facility] 07/27/20   Basal cell carcinoma 02/17/2021   L neck post auricular - ED&C    Dysplastic nevus 05/17/2020    Left mid to upper back 6cm lat to spine. Mild atypia, limited margins free.    Dysplastic nevus 08/10/2020   left pubic area, mild atypia, limited margins free   GERD (gastroesophageal reflux disease)    History of hiatal hernia    HLD (hyperlipidemia) 04/30/2015   Hypertension    Melanoma ([place]) 05/17/2020   Mid to upper back, left paraspinal. MMIS, lateral margin involved. exc 07/20/20, margins free.   Osteoarthrosis involving multiple sites    Prostate cancer ([place]) 2019   prostatectomy   Ruptured lumbar disc    L4 & L5   Sleep apnea     Surgical History: Past Surgical History:  Procedure Laterality Date   APPENDECTOMY     BACK SURGERY  09/2013   L4-L5    BASAL CELL CARCINOMA EXCISION  2012   right neck    PELVIC LYMPH NODE DISSECTION Bilateral 07/09/2017   Procedure: PELVIC LYMPH NODE DISSECTION;  Surgeon: Hollice Espy, MD;  Location: [facility] ORS;  Service: Urology;  Laterality: Bilateral;   PROSTATE BIOPSY     REPLACEMENT TOTAL KNEE  2002   left knee    REPLACEMENT TOTAL KNEE     right x 2   ROBOT ASSISTED LAPAROSCOPIC RADICAL PROSTATECTOMY N/A 07/09/2017   Procedure: ROBOTIC ASSISTED LAPAROSCOPIC RADICAL PROSTATECTOMY;  Surgeon: Hollice Espy, MD;  Location: [facility] ORS;  Service: Urology;  Laterality: N/A;    Home Medications:  Allergies as of 06/14/2021       Reactions   Amlodipine Other (See Comments)   LE EDEMA   Lisinopril    Cough-onset  Medication List        Accurate as of June 14, 2021 11:59 PM. If you have any questions, ask your nurse or doctor.          STOP taking these medications    mupirocin ointment 2 % Commonly known as: BACTROBAN Stopped by: Chania Kochanski, PA-C   valsartan 320 MG tablet Commonly known as: DIOVAN Stopped by: Shavone Nevers, PA-C       TAKE these medications    cyanocobalamin 1000 MCG/ML injection Commonly known as: (VITAMIN B-12) Inject 1,000 mcg into the muscle every 14 (fourteen) days.    doxazosin 2 MG tablet Commonly known as: Cardura Take 1 tablet (2 mg total) by mouth 2 (two) times daily.   hydrochlorothiazide 25 MG tablet Commonly known as: HYDRODIURIL TAKE 1 TABLET BY MOUTH EVERY OTHER DAY   hydroxychloroquine 200 MG tablet Commonly known as: PLAQUENIL Take 1 tablet by mouth 2 (two) times daily.   latanoprost 0.005 % ophthalmic solution Commonly known as: XALATAN Apply to eye.   naproxen sodium 220 MG tablet Commonly known as: ALEVE Take 440 mg by mouth daily.   omeprazole 40 MG capsule Commonly known as: PRILOSEC Take 40 mg by mouth daily.   Pfizer-BioNTech COVID-19 Vacc 30 MCG/0.3ML injection Generic drug: COVID-19 mRNA vaccine (Pfizer) USE AS DIRECTED   potassium chloride 10 MEQ tablet Commonly known as: KLOR-CON Take 10 mEq by mouth 3 (three) times daily.   valsartan-hydrochlorothiazide 320-25 MG tablet Commonly known as: DIOVAN-HCT Take 1 tablet by mouth daily.   verapamil 240 MG (CO) 24 hr tablet Commonly known as: COVERA HS Take 240 mg by mouth 2 (two) times daily.        Allergies:  Allergies  Allergen Reactions   Amlodipine Other (See Comments)    LE EDEMA   Lisinopril     Cough-onset     Family History: Family History  Problem Relation Age of Onset   Heart failure Mother    Heart attack Maternal Grandfather    Bladder Cancer Neg Hx    Prostate cancer Neg Hx    Kidney cancer Neg Hx     Social History:  reports that he quit smoking about 40 years ago. His smoking use included cigarettes. He smoked an average of 1 pack per day. He has quit using smokeless tobacco. He reports current alcohol use of about 1.0 standard drink per week. He reports that he does not use drugs.  ROS: For pertinent review of systems please refer to history of present illness  Physical Exam: BP (!) 155/84 (BP Location: Left Arm, Patient Position: Sitting, Cuff Size: Normal)   Pulse 72   Ht 5\' 8"  (1.727 m)   Wt 204 lb (92.5 kg)   BMI 31.02  kg/m   Constitutional:  Well nourished. Alert and oriented, No acute distress. HEENT:  AT, mask in place.  Trachea midline Cardiovascular: No clubbing, cyanosis, or edema. Respiratory: Normal respiratory effort, no increased work of breathing. Neurologic: Grossly intact, no focal deficits, moving all 4 extremities. Psychiatric: Normal mood and affect.   Laboratory Data:  Urinalysis Component     Latest Ref Rng & Units 06/14/2021  Specific Gravity, UA     1.005 - 1.030 1.015  pH, UA     5.0 - 7.5 6.5  Color, UA     Yellow Yellow  Appearance Ur     Clear Clear  Leukocytes,UA     Negative Negative  Protein,UA     Negative/Trace Negative  Glucose, UA     Negative Negative  Ketones, UA     Negative Negative  RBC, UA     Negative Negative  Bilirubin, UA     Negative Negative  Urobilinogen, Ur     0.2 - 1.0 mg/dL 0.2  Nitrite, UA     Negative Negative  Microscopic Examination      See below:   Component     Latest Ref Rng & Units 06/14/2021  WBC, UA     0 - 5 /hpf 0-5  RBC     0 - 2 /hpf 0-2  Epithelial Cells (non renal)     0 - 10 /hpf None seen  Bacteria, UA     None seen/Few None seen   I have reviewed the labs.   Pertinent Imaging: N/A   Assessment & Plan:    1. Prostate cancer ([place]) -PSA undetectable  2. Stress incontinence, male -minimal bother  3. High risk hematuria -work up in 2021 -NED -no reports of gross hematuria -UA negative for micro heme    4. Night sweats/fatigue -explained that we could check a testosterone level, but it would be an academic exercise as testosterone therapy is contraindicated in men with a history of prostate cancer -I did offer further discussion regarding this if he is finding his night sweats/fatigue are affecting his life in a significantly negative way we could refer off to a tertiary clinic, but he deferred at this time as he feels his symptoms are not severe enough to risk testosterone therapy and cause a  recurrence of his prostate cancer  Return in about 6 months (around 12/12/2021) for f/u w/ PSA prior .  Zara Council, PA-C  [facility] [address], [place], [place] [address] [phone]

## 2021-06-14 ENCOUNTER — Encounter: Payer: Self-pay | Admitting: Urology

## 2021-06-14 ENCOUNTER — Other Ambulatory Visit: Payer: Self-pay

## 2021-06-14 ENCOUNTER — Ambulatory Visit (INDEPENDENT_AMBULATORY_CARE_PROVIDER_SITE_OTHER): Payer: Medicare Other | Admitting: Urology

## 2021-06-14 VITALS — BP 155/84 | HR 72 | Ht 68.0 in | Wt 204.0 lb

## 2021-06-14 DIAGNOSIS — N393 Stress incontinence (female) (male): Secondary | ICD-10-CM | POA: Diagnosis not present

## 2021-06-14 DIAGNOSIS — R61 Generalized hyperhidrosis: Secondary | ICD-10-CM | POA: Diagnosis not present

## 2021-06-14 DIAGNOSIS — C61 Malignant neoplasm of prostate: Secondary | ICD-10-CM

## 2021-06-14 DIAGNOSIS — R319 Hematuria, unspecified: Secondary | ICD-10-CM

## 2021-06-15 LAB — MICROSCOPIC EXAMINATION
Bacteria, UA: NONE SEEN
Epithelial Cells (non renal): NONE SEEN /hpf (ref 0–10)

## 2021-06-15 LAB — URINALYSIS, COMPLETE
Bilirubin, UA: NEGATIVE
Glucose, UA: NEGATIVE
Ketones, UA: NEGATIVE
Leukocytes,UA: NEGATIVE
Nitrite, UA: NEGATIVE
Protein,UA: NEGATIVE
RBC, UA: NEGATIVE
Specific Gravity, UA: 1.015 (ref 1.005–1.030)
Urobilinogen, Ur: 0.2 mg/dL (ref 0.2–1.0)
pH, UA: 6.5 (ref 5.0–7.5)

## 2021-07-01 ENCOUNTER — Other Ambulatory Visit: Payer: Self-pay

## 2021-07-01 ENCOUNTER — Encounter (HOSPITAL_COMMUNITY): Payer: Self-pay

## 2021-07-01 ENCOUNTER — Emergency Department (HOSPITAL_COMMUNITY)
Admission: EM | Admit: 2021-07-01 | Discharge: 2021-07-02 | Disposition: A | Discharge status: Home / Self Care | Payer: Medicare Other | Attending: Emergency Medicine | Admitting: Emergency Medicine

## 2021-07-01 ENCOUNTER — Emergency Department (HOSPITAL_COMMUNITY): Payer: Medicare Other

## 2021-07-01 DIAGNOSIS — I10 Essential (primary) hypertension: Secondary | ICD-10-CM | POA: Diagnosis not present

## 2021-07-01 DIAGNOSIS — R079 Chest pain, unspecified: Secondary | ICD-10-CM | POA: Diagnosis not present

## 2021-07-01 DIAGNOSIS — Z85828 Personal history of other malignant neoplasm of skin: Secondary | ICD-10-CM | POA: Insufficient documentation

## 2021-07-01 DIAGNOSIS — R0602 Shortness of breath: Secondary | ICD-10-CM | POA: Diagnosis not present

## 2021-07-01 DIAGNOSIS — Z96653 Presence of artificial knee joint, bilateral: Secondary | ICD-10-CM | POA: Insufficient documentation

## 2021-07-01 DIAGNOSIS — Z79899 Other long term (current) drug therapy: Secondary | ICD-10-CM | POA: Insufficient documentation

## 2021-07-01 DIAGNOSIS — R42 Dizziness and giddiness: Secondary | ICD-10-CM | POA: Insufficient documentation

## 2021-07-01 DIAGNOSIS — Z87891 Personal history of nicotine dependence: Secondary | ICD-10-CM | POA: Diagnosis not present

## 2021-07-01 DIAGNOSIS — Z8546 Personal history of malignant neoplasm of prostate: Secondary | ICD-10-CM | POA: Insufficient documentation

## 2021-07-01 LAB — COMPREHENSIVE METABOLIC PANEL
ALT: 23 U/L (ref 0–44)
AST: 23 U/L (ref 15–41)
Albumin: 3.7 g/dL (ref 3.5–5.0)
Alkaline Phosphatase: 59 U/L (ref 38–126)
Anion gap: 9 (ref 5–15)
BUN: 13 mg/dL (ref 8–23)
CO2: 26 mmol/L (ref 22–32)
Calcium: 9 mg/dL (ref 8.9–10.3)
Chloride: 104 mmol/L (ref 98–111)
Creatinine, Ser: 1.23 mg/dL (ref 0.61–1.24)
GFR, Estimated: >60 mL/min (ref >60)
Glucose, Bld: 104 mg/dL — ABNORMAL HIGH (ref 70–99)
Potassium: 3.5 mmol/L (ref 3.5–5.1)
Sodium: 139 mmol/L (ref 135–145)
Total Bilirubin: 0.6 mg/dL (ref 0.3–1.2)
Total Protein: 6.2 g/dL — ABNORMAL LOW (ref 6.5–8.1)

## 2021-07-01 LAB — CBC WITH DIFFERENTIAL/PLATELET
Abs Immature Granulocytes: 0.01 10*3/uL (ref 0.00–0.07)
Basophils Absolute: 0.1 10*3/uL (ref 0.0–0.1)
Basophils Relative: 1 %
Eosinophils Absolute: 0.1 10*3/uL (ref 0.0–0.5)
Eosinophils Relative: 1 %
HCT: 43.7 % (ref 39.0–52.0)
Hemoglobin: 14.8 g/dL (ref 13.0–17.0)
Immature Granulocytes: 0 %
Lymphocytes Relative: 15 %
Lymphs Abs: 0.9 10*3/uL (ref 0.7–4.0)
MCH: 29 pg (ref 26.0–34.0)
MCHC: 33.9 g/dL (ref 30.0–36.0)
MCV: 85.5 fL (ref 80.0–100.0)
Monocytes Absolute: 0.5 10*3/uL (ref 0.1–1.0)
Monocytes Relative: 8 %
Neutro Abs: 4.6 10*3/uL (ref 1.7–7.7)
Neutrophils Relative %: 75 %
Platelets: 192 10*3/uL (ref 150–400)
RBC: 5.11 MIL/uL (ref 4.22–5.81)
RDW: 12.2 % (ref 11.5–15.5)
WBC: 6.1 10*3/uL (ref 4.0–10.5)
nRBC: 0 % (ref 0.0–0.2)

## 2021-07-01 LAB — TROPONIN I (HIGH SENSITIVITY)
Troponin I (High Sensitivity): 5 ng/L (ref <18)
Troponin I (High Sensitivity): 5 ng/L (ref <18)

## 2021-07-01 MED ORDER — LORAZEPAM 2 MG/ML IJ SOLN: 1.0000 mg | Freq: Once | INTRAMUSCULAR | Status: DC

## 2021-07-01 MED ORDER — LORAZEPAM 1 MG PO TABS
1.0000 mg | ORAL_TABLET | Freq: Once | ORAL | Status: AC
  Administered 2021-07-01: 1 mg via ORAL
  Filled 2021-07-01: qty 1

## 2021-07-01 MED ORDER — MECLIZINE HCL 25 MG PO TABS
25.0000 mg | ORAL_TABLET | Freq: Once | ORAL | Status: AC
  Administered 2021-07-01: 25 mg via ORAL
  Filled 2021-07-01: qty 1

## 2021-07-01 MED ORDER — MECLIZINE HCL 25 MG PO TABS: 25.0000 mg | ORAL_TABLET | Freq: Two times a day (BID) | ORAL | 0 refills | Status: AC | PRN

## 2021-07-01 NOTE — ED Notes (Signed)
The pt just returned from mri  no complaints

## 2021-07-01 NOTE — Discharge Instructions (Addendum)
I am prescribing you a medication called meclizine.  This is the same medication that you were given in the emergency department for your dizziness.  You can take this up to twice per day if your symptoms worsen.  Do not drive a car after taking this.  Do not mix it with alcohol.  Please follow-up with your regular doctor next week.  If you develop any new or worsening symptoms over the weekend please come back to the emergency department immediately.  It was a pleasure to meet you.

## 2021-07-01 NOTE — ED Triage Notes (Signed)
Pt BIB [facility] from home c/o CP and dizziness. Pt was driving while this happened and pulled over and had a friend drive him home but decided to come in. EMS gave 324 of ASA and 1 Nitro with some relief but not much.

## 2021-07-01 NOTE — ED Provider Notes (Signed)
[facility] EMERGENCY DEPARTMENT Provider Note   CSN: [id number] Arrival date & time: 07/01/21  1802     History Chief Complaint  Patient presents with   Chest Pain    Jerry Welch is a 70 y.o. male.  HPI Patient is a 70 year old male with a history of GERD, hyperlipidemia, hypertension, who presents to the emergency department due to dizziness.  Patient states around 4:30 PM he was driving in his car and began experiencing dizziness.  He was able to get himself home and while moving his symptoms worsen significantly.  While this was occurring he began developing central chest heaviness and shortness of breath as well.  States that when sitting his his dizziness will improve.  EMS was called and while moving to the stretcher he states his symptoms of dizziness worsen significantly.  He was given 324 mg of ASA as well as 1 NTG without improvement of his symptoms and notes that he began developing a headache after being given the NTG.  Also reports bilateral blurry vision.  No numbness or weakness.    Past Medical History:  Diagnosis Date   Back pain    Basal cell carcinoma 05/17/2020   Right top of shoulder. Nodular pattern. EDC   Basal cell carcinoma 05/17/2020   Left inferior scapula. Nodular pattern. [facility] 07/27/20   Basal cell carcinoma 02/17/2021   L neck post auricular - ED&C    Dysplastic nevus 05/17/2020   Left mid to upper back 6cm lat to spine. Mild atypia, limited margins free.    Dysplastic nevus 08/10/2020   left pubic area, mild atypia, limited margins free   GERD (gastroesophageal reflux disease)    History of hiatal hernia    HLD (hyperlipidemia) 04/30/2015   Hypertension    Melanoma ([place]) 05/17/2020   Mid to upper back, left paraspinal. MMIS, lateral margin involved. exc 07/20/20, margins free.   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   Abnormal prostate specific antigen 07/23/2013   Benign prostatic hyperplasia with urinary obstruction 07/23/2013   Excessive urination at night 07/23/2013   Decreased urine stream 07/23/2013   FOM (frequency of micturition) 07/23/2013   Elevated prostate specific antigen (PSA) 07/23/2013   Slowing of urinary stream 07/23/2013   HTN (hypertension) 07/26/2011   Somnolence 07/26/2011   SOB (shortness of breath) 07/26/2011    Past Surgical History:  Procedure Laterality Date   APPENDECTOMY     BACK SURGERY  09/2013   L4-L5    BASAL CELL CARCINOMA EXCISION  2012   right neck    PELVIC LYMPH NODE DISSECTION Bilateral 07/09/2017   Procedure: PELVIC LYMPH NODE DISSECTION;  Surgeon: Hollice Espy, MD;  Location: [facility] ORS;  Service: Urology;  Laterality: Bilateral;   PROSTATE BIOPSY     REPLACEMENT TOTAL KNEE  2002   left knee    REPLACEMENT TOTAL KNEE     right x 2   ROBOT ASSISTED LAPAROSCOPIC RADICAL PROSTATECTOMY N/A 07/09/2017   Procedure: ROBOTIC ASSISTED  LAPAROSCOPIC RADICAL PROSTATECTOMY;  Surgeon: Hollice Espy, MD;  Location: [facility] ORS;  Service: Urology;  Laterality: N/A;       Family History  Problem Relation Age of Onset   Heart failure Mother    Heart attack Maternal Grandfather    Bladder Cancer Neg Hx    Prostate cancer Neg Hx    Kidney cancer Neg Hx     Social History   Tobacco Use   Smoking status: Former    Packs/day: 1.00    Types: Cigarettes    Quit date: 06/06/1981    Years since quitting: 40.0   Smokeless tobacco: Former  Scientific laboratory technician Use: Never used  Substance  Use Topics   Alcohol use: Yes    Alcohol/week: 1.0 standard drink    Types: 1 Standard drinks or equivalent per week   Drug use: No    Home Medications Prior to Admission medications   Medication Sig Start Date End Date Taking? Authorizing Provider  cyanocobalamin (,VITAMIN B-12,) 1000 MCG/ML injection Inject 1,000 mcg into the muscle every 14 (fourteen) days.  03/07/16  Yes [provider]  hydroxychloroquine (PLAQUENIL) 200 MG tablet Take 1 tablet by mouth 2 (two) times daily. 04/13/21  Yes [provider]  latanoprost (XALATAN) 0.005 % ophthalmic solution Place 1 drop into both eyes at bedtime. 11/01/20  Yes [provider]  loratadine (ALLERGY) 10 MG tablet Take 10 mg by mouth daily.   Yes [provider]  meclizine (ANTIVERT) 25 MG tablet Take 1 tablet (25 mg total) by mouth 2 (two) times daily as needed for dizziness. 07/01/21  Yes Rayna Sexton, PA-C  naproxen sodium (ANAPROX) 220 MG tablet Take 440 mg by mouth daily as needed (pain).   Yes [provider]  omeprazole (PRILOSEC) 40 MG capsule Take 40 mg by mouth daily.    Yes [provider]  potassium chloride (KLOR-CON) 10 MEQ tablet Take 10 mEq by mouth 3 (three) times daily. 05/19/21  Yes [provider]  valsartan-hydrochlorothiazide (DIOVAN-HCT) 320-25 MG tablet Take 1 tablet by mouth daily. 06/10/21  Yes [provider]  verapamil (COVERA HS) 240 MG (CO) 24 hr tablet Take 240 mg by mouth 2 (two) times daily.   Yes [provider]  COVID-19 mRNA vaccine, Pfizer, 30 MCG/0.3ML injection USE AS DIRECTED 08/06/20 08/06/21  Carlyle Basques, MD  doxazosin (CARDURA) 2 MG tablet Take 1 tablet (2 mg total) by mouth 2 (two) times daily. Patient not taking: Reported on 07/01/2021 12/09/20   Minna Merritts, MD  hydrochlorothiazide (HYDRODIURIL) 25 MG tablet TAKE 1 TABLET BY MOUTH EVERY OTHER DAY Patient not taking: Reported on 07/01/2021 03/15/21   Minna Merritts,  MD    Allergies    Amlodipine and Lisinopril  Review of Systems   Review of Systems  All other systems reviewed and are negative. Ten systems reviewed and are negative for acute change, except as noted in the HPI.   Physical Exam Updated Vital Signs BP 135/89   Pulse 77   Temp 98 F (36.7 C)   Resp 17   Ht 5\' 9"  (1.753 m)   Wt 90.7 kg   SpO2 98%   BMI 29.53 kg/m   Physical Exam Vitals and nursing note reviewed.  Constitutional:      General: He is not in acute distress.    Appearance: Normal appearance. He is not ill-appearing, toxic-appearing or diaphoretic.  HENT:     Head: Normocephalic and atraumatic.  Right Ear: Tympanic membrane, ear canal and external ear normal.     Left Ear: Tympanic membrane, ear canal and external ear normal.     Nose: Nose normal.     Mouth/Throat:     Mouth: Mucous membranes are moist.     Pharynx: Oropharynx is clear. No oropharyngeal exudate or posterior oropharyngeal erythema.  Eyes:     Extraocular Movements: Extraocular movements intact.     Pupils: Pupils are equal, round, and reactive to light.  Cardiovascular:     Rate and Rhythm: Normal rate and regular rhythm.     Pulses: Normal pulses.     Heart sounds: Normal heart sounds. Heart sounds not distant. No murmur heard. No systolic murmur is present.  No diastolic murmur is present.    No friction rub. No gallop. No S3 or S4 sounds.  Pulmonary:     Effort: Pulmonary effort is normal. No tachypnea, accessory muscle usage or respiratory distress.     Breath sounds: Normal breath sounds. No stridor. No decreased breath sounds, wheezing, rhonchi or rales.  Abdominal:     General: Abdomen is flat.     Tenderness: There is no abdominal tenderness.  Musculoskeletal:        General: Normal range of motion.     Cervical back: Normal range of motion and neck supple. No tenderness.  Skin:    General: Skin is warm and dry.  Neurological:     General: No focal deficit present.      Mental Status: He is alert and oriented to person, place, and time.     Comments: Patient is oriented to person, place, and time. Patient phonates in clear, complete, and coherent sentences. Negative arm drift. Finger to nose intact bilaterally with no visible signs of dysmetria. Strength is 5/5 in all four extremities. Distal sensation intact in all four extremities.  Vertiginous symptoms worsen significantly when sitting patient upright from a semi-Fowler's position.   Psychiatric:        Mood and Affect: Mood normal.        Behavior: Behavior normal.   ED Results / Procedures / Treatments   Labs (all labs ordered are listed, but only abnormal results are displayed) Labs Reviewed  COMPREHENSIVE METABOLIC PANEL - Abnormal; Notable for the following components:      Result Value   Glucose, Bld 104 (*)    Total Protein 6.2 (*)    All other components within normal limits  CBC WITH DIFFERENTIAL/PLATELET  TROPONIN I (HIGH SENSITIVITY)  TROPONIN I (HIGH SENSITIVITY)   EKG EKG Interpretation  Date/Time:  Friday July 01 2021 18:05:04 EDT Ventricular Rate:  57 PR Interval:  181 QRS Duration: 112 QT Interval:  480 QTC Calculation: 468 R Axis:   -10 Text Interpretation: Sinus rhythm Borderline intraventricular conduction delay Confirmed by Thamas Jaegers ([id number]) on 07/01/2021 6:28:06 PM  Radiology MR BRAIN WO CONTRAST  Result Date: 07/01/2021 CLINICAL DATA:  Initial evaluation for acute dizziness. EXAM: MRI HEAD WITHOUT CONTRAST TECHNIQUE: Multiplanar, multiecho pulse sequences of the brain and surrounding structures were obtained without intravenous contrast. COMPARISON:  CT from 09/20/2016. FINDINGS: Brain: Cerebral volume within normal limits for age. Patchy T2/FLAIR hyperintensity within the periventricular and deep white matter both cerebral hemispheres as well as the pons, most consistent with chronic small vessel ischemic disease, moderate in nature. Few remote lacunar infarcts noted  at the left pons. No abnormal foci of restricted diffusion to suggest acute or subacute ischemia. Gray-white matter differentiation maintained. No encephalomalacia  to suggest chronic cortical infarction. No acute or chronic intracranial hemorrhage. No mass lesion, mass effect or midline shift. No hydrocephalus or extra-axial fluid collection. Pituitary gland and suprasellar region normal. Midline structures intact. Vascular: Major intracranial vascular flow voids are maintained. Hypoplastic left vertebral artery noted. Skull and upper cervical spine: Mild cerebellar tonsillar ectopia of approximately 3-4 mm without frank Chiari malformation. Craniocervical junction otherwise unremarkable. Bone marrow signal intensity within normal limits. No scalp soft tissue abnormality. Sinuses/Orbits: Globes and orbital soft tissues within normal limits. Paranasal sinuses are largely clear. No mastoid effusion. Inner ear structures grossly normal. Other: None. IMPRESSION: 1. No acute intracranial abnormality. 2. 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 Electronically Signed   By: Jeannine Boga M.D.   On: 07/01/2021 20:12   DG Chest Portable 1 View  Result Date: 07/01/2021 CLINICAL DATA:  Pt reports cp, sob, and dizziness that started around 4pm today. Reports hx of htn but no other cardiac hx. Hx of GERD, htn, former tobacco use EXAM: PORTABLE CHEST 1 VIEW COMPARISON:  Chest x-ray 10/13/2016 FINDINGS: The heart and mediastinal contours are unchanged. A aortic calcification. No focal consolidation. No pulmonary edema. No pleural effusion. No pneumothorax. No acute osseous abnormality. IMPRESSION: No active disease. Electronically Signed   By: Iven Finn M.D.   On: 07/01/2021 23:03    Procedures Procedures   Medications Ordered in ED Medications  meclizine (ANTIVERT) tablet 25 mg (25 mg Oral Given 07/01/21 2032)  LORazepam (ATIVAN) tablet 1 mg  (1 mg Oral Given 07/01/21 2239)   ED Course  I have reviewed the triage vital signs and the nursing notes.  Pertinent labs & imaging results that were available during my care of the patient were reviewed by me and considered in my medical decision making (see chart for details).    MDM Rules/Calculators/A&P                          Pt is a 70 y.o. male who presents to the emergency department due to dizziness, chest heaviness, shortness of breath.  Labs: CBC without abnormalities. CMP with a glucose 104 and a total protein of 6.2. Troponin of 5 with a repeat of 5.  Imaging: Chest x-ray is negative. MRI of the brain shows no acute intracranial abnormalities.  Moderate chronic microvascular ischemic disease with a few small remote lacunar infarcts at the left pons.  I, Rayna Sexton, PA-C, personally reviewed and evaluated these images and lab results as part of my medical decision-making.  Lab work is reassuring.  Reassuring troponins, ECG, and chest x-ray.  Patient denied improvement in his sx with NTG with EMS but states that both Meclizine and Ativan resolved his chest tightness and SOB.  Doubt ACS at this time.  MRI of the brain shows no acute intracranial abnormalities.  Patient's neurological exam was also reassuring.  His symptoms worsen significantly with any head movement.  Given his reassuring imaging and his physical exam findings this appears consistent with BPPV.  Patient given a dose of meclizine as well as a dose of Ativan and notes improvement in his symptoms.  He is now able to stand and ambulate unassisted.  Feel that the patient is stable for discharge at this time and he and his family at bedside are agreeable.  Will discharge on a short course of meclizine.  We discussed safety regarding this medication.  Recommended PCP follow-up next  week.  We discussed return precautions at length.  His questions were answered and he was amicable at the time of discharge.  Note:  Portions of this report may have been transcribed using voice recognition software. Every effort was made to ensure accuracy; however, inadvertent computerized transcription errors may be present.   Final Clinical Impression(s) / ED Diagnoses Final diagnoses:  Dizziness   Rx / DC Orders ED Discharge Orders          Ordered    meclizine (ANTIVERT) 25 MG tablet  2 times daily PRN        07/01/21 2353             Rayna Sexton, PA-C 07/02/21 0059    Luna Fuse, MD 07/13/21 [phone]

## 2021-07-11 ENCOUNTER — Other Ambulatory Visit: Payer: Self-pay

## 2021-07-11 ENCOUNTER — Ambulatory Visit (INDEPENDENT_AMBULATORY_CARE_PROVIDER_SITE_OTHER): Payer: Medicare Other | Admitting: Internal Medicine

## 2021-07-11 VITALS — BP 139/84 | HR 64 | Resp 16 | Ht 69.0 in | Wt 202.9 lb

## 2021-07-11 DIAGNOSIS — Z9989 Dependence on other enabling machines and devices: Secondary | ICD-10-CM | POA: Diagnosis not present

## 2021-07-11 DIAGNOSIS — Z7189 Other specified counseling: Secondary | ICD-10-CM

## 2021-07-11 DIAGNOSIS — I10 Essential (primary) hypertension: Secondary | ICD-10-CM

## 2021-07-11 DIAGNOSIS — G4733 Obstructive sleep apnea (adult) (pediatric): Secondary | ICD-10-CM

## 2021-07-11 NOTE — Progress Notes (Signed)
[facility] [place], [place] [address]  Pulmonary Sleep Medicine   Office Visit Note  Patient Name: Jerry Welch DOB: April 04, 1951 MRN [id number]    Chief Complaint: Obstructive Sleep Apnea visit  Brief History:  Jerry Welch is seen today for one year follow up.  The patient has a 5 year history of sleep apnea. Patient is using PAP nightly and currently on APAP 10 - 20 cmH2O.  The patient feels better after sleeping with PAP.  The patient reports benefiting from PAP use.  the Epworth Sleepiness Score is 15 out of 24. The patient does not take naps. The patient complains of the following: nothing  The compliance download shows  compliance with an average use time of 7:24 hours @ 97%. The AHI is 4.3  The patient does not complain of limb movements disrupting sleep.  ROS  General: (-) fever, (-) chills, (-) night sweat Nose and Sinuses: (-) nasal stuffiness or itchiness, (-) postnasal drip, (-) nosebleeds, (-) sinus trouble. Mouth and Throat: (-) sore throat, (-) hoarseness. Neck: (-) swollen glands, (-) enlarged thyroid, (-) neck pain. Respiratory: - cough, - shortness of breath, - wheezing. Neurologic: - numbness, - tingling. Psychiatric: - anxiety, - depression   Current Medication: Outpatient Encounter Medications as of 07/11/2021  Medication Sig   COVID-19 mRNA vaccine, Pfizer, 30 MCG/0.3ML injection USE AS DIRECTED   cyanocobalamin (,VITAMIN B-12,) 1000 MCG/ML injection Inject 1,000 mcg into the muscle every 14 (fourteen) days.    doxazosin (CARDURA) 2 MG tablet Take 1 tablet (2 mg total) by mouth 2 (two) times daily. (Patient not taking: Reported on 07/01/2021)   hydrochlorothiazide (HYDRODIURIL) 25 MG tablet TAKE 1 TABLET BY MOUTH EVERY OTHER DAY (Patient not taking: Reported on 07/01/2021)   hydroxychloroquine (PLAQUENIL) 200 MG tablet Take 1 tablet by mouth 2 (two) times daily.   latanoprost (XALATAN) 0.005 % ophthalmic solution Place 1 drop into  both eyes at bedtime.   loratadine (ALLERGY) 10 MG tablet Take 10 mg by mouth daily.   meclizine (ANTIVERT) 25 MG tablet Take 1 tablet (25 mg total) by mouth 2 (two) times daily as needed for dizziness.   naproxen sodium (ANAPROX) 220 MG tablet Take 440 mg by mouth daily as needed (pain).   omeprazole (PRILOSEC) 40 MG capsule Take 40 mg by mouth daily.    potassium chloride (KLOR-CON) 10 MEQ tablet Take 10 mEq by mouth 3 (three) times daily.   valsartan-hydrochlorothiazide (DIOVAN-HCT) 320-25 MG tablet Take 1 tablet by mouth daily.   verapamil (COVERA HS) 240 MG (CO) 24 hr tablet Take 240 mg by mouth 2 (two) times daily.   No facility-administered encounter medications on file as of 07/11/2021.    Surgical History: Past Surgical History:  Procedure Laterality Date   APPENDECTOMY     BACK SURGERY  09/2013   L4-L5    BASAL CELL CARCINOMA EXCISION  2012   right neck    PELVIC LYMPH NODE DISSECTION Bilateral 07/09/2017   Procedure: PELVIC LYMPH NODE DISSECTION;  Surgeon: Hollice Espy, MD;  Location: [facility] ORS;  Service: Urology;  Laterality: Bilateral;   PROSTATE BIOPSY     REPLACEMENT TOTAL KNEE  2002   left knee    REPLACEMENT TOTAL KNEE     right x 2   ROBOT ASSISTED LAPAROSCOPIC RADICAL PROSTATECTOMY N/A 07/09/2017   Procedure: ROBOTIC ASSISTED LAPAROSCOPIC RADICAL PROSTATECTOMY;  Surgeon: Hollice Espy, MD;  Location: [facility] ORS;  Service: Urology;  Laterality: N/A;    Medical History: Past  Medical History:  Diagnosis Date   Back pain    Basal cell carcinoma 05/17/2020   Right top of shoulder. Nodular pattern. EDC   Basal cell carcinoma 05/17/2020   Left inferior scapula. [facility]. [facility]   Basal cell carcinoma 02/17/2021   L neck post auricular - ED&C    Dysplastic nevus 05/17/2020   Left mid to upper back 6cm lat to spine. Mild atypia, limited margins free.    Dysplastic nevus 08/10/2020   left pubic area, mild atypia, limited margins free   GERD  (gastroesophageal reflux disease)    History of hiatal hernia    HLD (hyperlipidemia) 04/30/2015   Hypertension    Melanoma ([place]) 05/17/2020   Mid to upper back, left paraspinal. MMIS, lateral margin involved. exc 07/20/20, margins free.   Osteoarthrosis involving multiple sites    Prostate cancer ([place]) 2019   prostatectomy   Ruptured lumbar disc    L4 & L5   Sleep apnea     Family History: Non contributory to the present illness  Social History: Social History   Socioeconomic History   Marital status: Married    Spouse name: Not on file   Number of children: Not on file   Years of education: Not on file   Highest education level: Not on file  Occupational History   Not on file  Tobacco Use   Smoking status: Former    Packs/day: 1.00    Types: Cigarettes    Quit date: 06/06/1981    Years since quitting: 40.1   Smokeless tobacco: Former  Scientific laboratory technician Use: Never used  Substance and Sexual Activity   Alcohol use: Yes    Alcohol/week: 1.0 standard drink    Types: 1 Standard drinks or equivalent per week   Drug use: No   Sexual activity: Yes    Birth control/protection: None  Other Topics Concern   Not on file  Social History Narrative   Not on file   Social Determinants of Health   Financial Resource Strain: Not on file  Food Insecurity: Not on file  Transportation Needs: Not on file  Physical Activity: Not on file  Stress: Not on file  Social Connections: Not on file  Intimate Partner Violence: Not on file    Vital Signs: There were no vitals taken for this visit.  Examination: General Appearance: The patient is well-developed, well-nourished, and in no distress. Neck Circumference: 43 Skin: Gross inspection of skin unremarkable. Head: normocephalic, no gross deformities. Eyes: no gross deformities noted. ENT: ears appear grossly normal Neurologic: Alert and oriented. No involuntary movements.    EPWORTH SLEEPINESS SCALE:  Scale:  (0)= no  chance of dozing; (1)= slight chance of dozing; (2)= moderate chance of dozing; (3)= high chance of dozing  Chance  Situtation    Sitting and reading: 2    Watching TV: 2    Sitting Inactive in public: 2    As a passenger in car: 2      Lying down to rest: 2    Sitting and talking: 1    Sitting quielty after lunch: 2    In a car, stopped in traffic: 2   TOTAL SCORE:   15 out of 24    SLEEP STUDIES:  HST 05/2015 REI 57 SpO65min 75%   CPAP COMPLIANCE DATA:  Date Range: 07/11/20 - 07/10/21  Average Daily Use: 7:24 hours  Median Use: 7:40 hours  Compliance for > 4 Hours: 97%  AHI:  4.3 respiratory events per hour  Days Used: 356/365  Mask Leak: 24.2 lpm  95th Percentile Pressure: 12.7 cmH2O         LABS: Recent Results (from the past 2160 hour(s))  PSA     Status: None   Collection Time: 06/07/21  7:59 AM  Result Value Ref Range   Prostate Specific Ag, Serum <0.1 0.0 - 4.0 ng/mL    Comment: **Verified by repeat analysis** Roche ECLIA methodology. According to the American Urological Association, Serum PSA should decrease and remain at undetectable levels after radical prostatectomy. The AUA defines biochemical recurrence as an initial PSA value 0.2 ng/mL or greater followed by a subsequent confirmatory PSA value 0.2 ng/mL or greater. Values obtained with different assay methods or kits cannot be used interchangeably. Results cannot be interpreted as absolute evidence of the presence or absence of malignant disease.   Urinalysis, Complete     Status: None   Collection Time: 06/14/21  8:57 AM  Result Value Ref Range   Specific Gravity, UA 1.015 1.005 - 1.030   pH, UA 6.5 5.0 - 7.5   Color, UA Yellow Yellow   Appearance Ur Clear Clear   Leukocytes,UA Negative Negative   Protein,UA Negative Negative/Trace   Glucose, UA Negative Negative   Ketones, UA Negative Negative   RBC, UA Negative Negative   Bilirubin, UA Negative Negative    Urobilinogen, Ur 0.2 0.2 - 1.0 mg/dL   Nitrite, UA Negative Negative   Microscopic Examination See below:   Microscopic Examination     Status: None   Collection Time: 06/14/21  8:57 AM   Urine  Result Value Ref Range   WBC, UA 0-5 0 - 5 /hpf   RBC 0-2 0 - 2 /hpf   Epithelial Cells (non renal) None seen 0 - 10 /hpf   Bacteria, UA None seen None seen/Few  Comprehensive metabolic panel     Status: Abnormal   Collection Time: 07/01/21  8:44 PM  Result Value Ref Range   Sodium 139 135 - 145 mmol/L   Potassium 3.5 3.5 - 5.1 mmol/L   Chloride 104 98 - 111 mmol/L   CO2 26 22 - 32 mmol/L   Glucose, Bld 104 (H) 70 - 99 mg/dL    Comment: Glucose reference range applies only to samples taken after fasting for at least 8 hours.   BUN 13 8 - 23 mg/dL   Creatinine, Ser 1.23 0.61 - 1.24 mg/dL   Calcium 9.0 8.9 - 10.3 mg/dL   Total Protein 6.2 (L) 6.5 - 8.1 g/dL   Albumin 3.7 3.5 - 5.0 g/dL   AST 23 15 - 41 U/L   ALT 23 0 - 44 U/L   Alkaline Phosphatase 59 38 - 126 U/L   Total Bilirubin 0.6 0.3 - 1.2 mg/dL   GFR, Estimated >60 >60 mL/min    Comment: (NOTE) Calculated using the CKD-EPI Creatinine Equation (2021)    Anion gap 9 5 - 15    Comment: Performed at [facility] [address] [place], [place] [address]  CBC with Differential     Status: None   Collection Time: 07/01/21  8:44 PM  Result Value Ref Range   WBC 6.1 4.0 - 10.5 K/uL   RBC 5.11 4.22 - 5.81 MIL/uL   Hemoglobin 14.8 13.0 - 17.0 g/dL   HCT 43.7 39.0 - 52.0 %   MCV 85.5 80.0 - 100.0 fL   MCH 29.0 26.0 - 34.0 pg   MCHC  33.9 30.0 - 36.0 g/dL   RDW 12.2 11.5 - 15.5 %   Platelets 192 150 - 400 K/uL   nRBC 0.0 0.0 - 0.2 %   Neutrophils Relative % 75 %   Neutro Abs 4.6 1.7 - 7.7 K/uL   Lymphocytes Relative 15 %   Lymphs Abs 0.9 0.7 - 4.0 K/uL   Monocytes Relative 8 %   Monocytes Absolute 0.5 0.1 - 1.0 K/uL   Eosinophils Relative 1 %   Eosinophils Absolute 0.1 0.0 - 0.5 K/uL   Basophils Relative 1 %    Basophils Absolute 0.1 0.0 - 0.1 K/uL   Immature Granulocytes 0 %   Abs Immature Granulocytes 0.01 0.00 - 0.07 K/uL    Comment: Performed at [facility] [address] [place], [place] [address]  Troponin I (High Sensitivity)     Status: None   Collection Time: 07/01/21  8:44 PM  Result Value Ref Range   Troponin I (High Sensitivity) 5 <18 ng/L    Comment: (NOTE) Elevated high sensitivity troponin I (hsTnI) values and significant  changes across serial measurements may suggest ACS but many other  chronic and acute conditions are known to elevate hsTnI results.  Refer to the "Links" section for chest pain algorithms and additional  guidance. Performed at [facility], [place] [address] [place], [place] [address]   Troponin I (High Sensitivity)     Status: None   Collection Time: 07/01/21 10:55 PM  Result Value Ref Range   Troponin I (High Sensitivity) 5 <18 ng/L    Comment: (NOTE) Elevated high sensitivity troponin I (hsTnI) values and significant  changes across serial measurements may suggest ACS but many other  chronic and acute conditions are known to elevate hsTnI results.  Refer to the "Links" section for chest pain algorithms and additional  guidance. Performed at [facility], [place] [address] [place], [place] [address]     Radiology: MR BRAIN WO CONTRAST  Result Date: 07/01/2021 CLINICAL DATA:  Initial evaluation for acute dizziness. EXAM: MRI HEAD WITHOUT CONTRAST TECHNIQUE: Multiplanar, multiecho pulse sequences of the brain and surrounding structures were obtained without intravenous contrast. COMPARISON:  CT from 09/20/2016. FINDINGS: Brain: Cerebral volume within normal limits for age. Patchy T2/FLAIR hyperintensity within the periventricular and deep white matter both cerebral hemispheres as well as the pons, most consistent with chronic small vessel ischemic disease, moderate in nature. Few remote lacunar infarcts noted at the left pons. No abnormal  foci of restricted diffusion to suggest acute or subacute ischemia. Gray-white matter differentiation maintained. No encephalomalacia to suggest chronic cortical infarction. No acute or chronic intracranial hemorrhage. No mass lesion, mass effect or midline shift. No hydrocephalus or extra-axial fluid collection. Pituitary gland and suprasellar region normal. Midline structures intact. Vascular: Major intracranial vascular flow voids are maintained. Hypoplastic left vertebral artery noted. Skull and upper cervical spine: Mild cerebellar tonsillar ectopia of approximately 3-4 mm without frank Chiari malformation. Craniocervical junction otherwise unremarkable. Bone marrow signal intensity within normal limits. No scalp soft tissue abnormality. Sinuses/Orbits: Globes and orbital soft tissues within normal limits. Paranasal sinuses are largely clear. No mastoid effusion. Inner ear structures grossly normal. Other: None. IMPRESSION: 1. No acute intracranial abnormality. 2. 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 Electronically Signed   By: Jeannine Boga M.D.   On: 07/01/2021 20:12   DG Chest Portable 1 View  Result Date: 07/01/2021 CLINICAL DATA:  Pt  reports cp, sob, and dizziness that started around 4pm today. Reports hx of htn but no other cardiac hx. Hx of GERD, htn, former tobacco use EXAM: PORTABLE CHEST 1 VIEW COMPARISON:  Chest x-ray 10/13/2016 FINDINGS: The heart and mediastinal contours are unchanged. A aortic calcification. No focal consolidation. No pulmonary edema. No pleural effusion. No pneumothorax. No acute osseous abnormality. IMPRESSION: No active disease. Electronically Signed   By: Iven Finn M.D.   On: 07/01/2021 23:03    No results found.  MR BRAIN WO CONTRAST  Result Date: 07/01/2021 CLINICAL DATA:  Initial evaluation for acute dizziness. EXAM: MRI HEAD WITHOUT CONTRAST TECHNIQUE: Multiplanar, multiecho  pulse sequences of the brain and surrounding structures were obtained without intravenous contrast. COMPARISON:  CT from 09/20/2016. FINDINGS: Brain: Cerebral volume within normal limits for age. Patchy T2/FLAIR hyperintensity within the periventricular and deep white matter both cerebral hemispheres as well as the pons, most consistent with chronic small vessel ischemic disease, moderate in nature. Few remote lacunar infarcts noted at the left pons. No abnormal foci of restricted diffusion to suggest acute or subacute ischemia. Gray-white matter differentiation maintained. No encephalomalacia to suggest chronic cortical infarction. No acute or chronic intracranial hemorrhage. No mass lesion, mass effect or midline shift. No hydrocephalus or extra-axial fluid collection. Pituitary gland and suprasellar region normal. Midline structures intact. Vascular: Major intracranial vascular flow voids are maintained. Hypoplastic left vertebral artery noted. Skull and upper cervical spine: Mild cerebellar tonsillar ectopia of approximately 3-4 mm without frank Chiari malformation. Craniocervical junction otherwise unremarkable. Bone marrow signal intensity within normal limits. No scalp soft tissue abnormality. Sinuses/Orbits: Globes and orbital soft tissues within normal limits. Paranasal sinuses are largely clear. No mastoid effusion. Inner ear structures grossly normal. Other: None. IMPRESSION: 1. No acute intracranial abnormality. 2. 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 Electronically Signed   By: Jeannine Boga M.D.   On: 07/01/2021 20:12   DG Chest Portable 1 View  Result Date: 07/01/2021 CLINICAL DATA:  Pt reports cp, sob, and dizziness that started around 4pm today. Reports hx of htn but no other cardiac hx. Hx of GERD, htn, former tobacco use EXAM: PORTABLE CHEST 1 VIEW COMPARISON:  Chest x-ray 10/13/2016 FINDINGS: The heart and  mediastinal contours are unchanged. A aortic calcification. No focal consolidation. No pulmonary edema. No pleural effusion. No pneumothorax. No acute osseous abnormality. IMPRESSION: No active disease. Electronically Signed   By: Iven Finn M.D.   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 OSA on CPAP The patient does tolerate PAP and reports definite benefit from PAP use. The patient was reminded how to clean equipment and advised to replace supplies routinely. The patient  was also counselled on weight loss. . The compliance is excellent. The AHI is 4.3.   OSA- continue excellent compliance. F/u one year.    2. CPAP use counseling CPAP Counseling: had a lengthy discussion with the patient regarding the importance of PAP therapy in management of the sleep apnea. Patient appears to understand the risk factor reduction and also understands the risks associated with untreated sleep apnea. Patient will try to make a good faith effort to remain compliant with therapy. Also instructed the patient on proper cleaning of the device including the water must be changed daily if possible and use  of distilled water is preferred. Patient understands that the machine should be regularly cleaned with appropriate recommended cleaning solutions that do not damage the PAP machine for example given white vinegar and water rinses. Other methods such as ozone treatment may not be as good as these simple methods to achieve cleaning.   3. Primary hypertension Hypertension Counseling:   The following hypertensive lifestyle modification were recommended and discussed:  1. Limiting alcohol intake to less than 1 oz/day of ethanol:(24 oz of beer or 8 oz of wine or 2 oz of 100-proof whiskey). 2. Take baby ASA 81 mg daily. 3. Importance of regular aerobic exercise and losing weight. 4. Reduce dietary saturated fat and cholesterol intake for overall cardiovascular health. 5. Maintaining adequate dietary potassium, calcium, and magnesium intake. 6. Regular monitoring of the blood pressure. 7. Reduce sodium intake to less than 100 mmol/day (less than 2.3 gm of sodium or less than 6 gm of sodium choride)      General Counseling: I have discussed the findings of the evaluation and examination with Eliyah.  I have also discussed any further diagnostic evaluation thatmay be needed or ordered today. Shawne verbalizes understanding of the findings of todays visit. We also reviewed his medications today and discussed drug interactions and side effects including but not limited excessive drowsiness and altered mental states. We also discussed that there is always a risk not just to him but also people around him. he has been encouraged to call the office with any questions or concerns that should arise related to todays visit.  No orders of the defined types were placed in this encounter.       I have personally obtained a history, examined the patient, evaluated laboratory and imaging results, formulated the assessment and plan and placed orders.   This patient was seen today by Tressie Ellis, PA-C in  collaboration with Dr. Devona Konig.     Allyne Gee, MD [facility] Diplomate ABMS Pulmonary and Critical Care Medicine Sleep medicine

## 2021-07-11 NOTE — Patient Instructions (Signed)

## 2021-07-15 ENCOUNTER — Other Ambulatory Visit: Payer: Self-pay | Admitting: Neurology

## 2021-07-15 ENCOUNTER — Other Ambulatory Visit (HOSPITAL_COMMUNITY): Payer: Self-pay | Admitting: Neurology

## 2021-07-15 DIAGNOSIS — R9082 White matter disease, unspecified: Secondary | ICD-10-CM

## 2021-07-15 DIAGNOSIS — Z8673 Personal history of transient ischemic attack (TIA), and cerebral infarction without residual deficits: Secondary | ICD-10-CM

## 2021-07-26 ENCOUNTER — Ambulatory Visit
Admission: RE | Admit: 2021-07-26 | Discharge: 2021-07-26 | Disposition: A | Discharge status: Home / Self Care | Payer: Medicare Other | Source: Ambulatory Visit | Attending: Neurology | Admitting: Neurology

## 2021-07-26 ENCOUNTER — Ambulatory Visit: Payer: Medicare Other

## 2021-07-26 ENCOUNTER — Other Ambulatory Visit: Payer: Self-pay

## 2021-07-26 DIAGNOSIS — Z8673 Personal history of transient ischemic attack (TIA), and cerebral infarction without residual deficits: Secondary | ICD-10-CM | POA: Diagnosis not present

## 2021-07-26 DIAGNOSIS — R9082 White matter disease, unspecified: Secondary | ICD-10-CM | POA: Diagnosis present

## 2021-10-03 DIAGNOSIS — Z79899 Other long term (current) drug therapy: Secondary | ICD-10-CM | POA: Diagnosis not present

## 2021-10-03 DIAGNOSIS — M1120 Other chondrocalcinosis, unspecified site: Secondary | ICD-10-CM | POA: Diagnosis not present

## 2021-10-10 DIAGNOSIS — E785 Hyperlipidemia, unspecified: Secondary | ICD-10-CM | POA: Diagnosis not present

## 2021-10-10 DIAGNOSIS — C61 Malignant neoplasm of prostate: Secondary | ICD-10-CM | POA: Diagnosis not present

## 2021-10-10 DIAGNOSIS — I10 Essential (primary) hypertension: Secondary | ICD-10-CM | POA: Diagnosis not present

## 2021-10-10 DIAGNOSIS — R600 Localized edema: Secondary | ICD-10-CM | POA: Diagnosis not present

## 2021-10-10 DIAGNOSIS — Z0000 Encounter for general adult medical examination without abnormal findings: Secondary | ICD-10-CM | POA: Diagnosis not present

## 2021-10-18 DIAGNOSIS — Z9989 Dependence on other enabling machines and devices: Secondary | ICD-10-CM | POA: Diagnosis not present

## 2021-10-18 DIAGNOSIS — R9082 White matter disease, unspecified: Secondary | ICD-10-CM | POA: Diagnosis not present

## 2021-10-18 DIAGNOSIS — R42 Dizziness and giddiness: Secondary | ICD-10-CM | POA: Diagnosis not present

## 2021-10-18 DIAGNOSIS — G4733 Obstructive sleep apnea (adult) (pediatric): Secondary | ICD-10-CM | POA: Diagnosis not present

## 2021-10-18 DIAGNOSIS — I10 Essential (primary) hypertension: Secondary | ICD-10-CM | POA: Diagnosis not present

## 2021-11-04 DIAGNOSIS — M069 Rheumatoid arthritis, unspecified: Secondary | ICD-10-CM | POA: Diagnosis not present

## 2021-11-04 DIAGNOSIS — Z79899 Other long term (current) drug therapy: Secondary | ICD-10-CM | POA: Diagnosis not present

## 2021-11-04 DIAGNOSIS — H16223 Keratoconjunctivitis sicca, not specified as Sjogren's, bilateral: Secondary | ICD-10-CM | POA: Diagnosis not present

## 2021-11-08 DIAGNOSIS — M25542 Pain in joints of left hand: Secondary | ICD-10-CM | POA: Diagnosis not present

## 2021-11-08 DIAGNOSIS — M1120 Other chondrocalcinosis, unspecified site: Secondary | ICD-10-CM | POA: Diagnosis not present

## 2021-11-08 DIAGNOSIS — M25541 Pain in joints of right hand: Secondary | ICD-10-CM | POA: Diagnosis not present

## 2021-11-30 ENCOUNTER — Ambulatory Visit: Payer: PPO | Admitting: Dermatology

## 2021-12-02 ENCOUNTER — Other Ambulatory Visit: Payer: Self-pay

## 2021-12-02 ENCOUNTER — Encounter: Payer: Self-pay | Admitting: Cardiovascular Disease

## 2021-12-02 ENCOUNTER — Ambulatory Visit: Payer: PPO | Admitting: Cardiovascular Disease

## 2021-12-02 VITALS — BP 120/86 | HR 66 | Ht 69.0 in | Wt 205.5 lb

## 2021-12-02 DIAGNOSIS — I10 Essential (primary) hypertension: Secondary | ICD-10-CM

## 2021-12-02 DIAGNOSIS — Z9989 Dependence on other enabling machines and devices: Secondary | ICD-10-CM

## 2021-12-02 DIAGNOSIS — E782 Mixed hyperlipidemia: Secondary | ICD-10-CM

## 2021-12-02 DIAGNOSIS — G4733 Obstructive sleep apnea (adult) (pediatric): Secondary | ICD-10-CM

## 2021-12-02 DIAGNOSIS — M7989 Other specified soft tissue disorders: Secondary | ICD-10-CM

## 2021-12-02 DIAGNOSIS — R0602 Shortness of breath: Secondary | ICD-10-CM

## 2021-12-02 DIAGNOSIS — R7989 Other specified abnormal findings of blood chemistry: Secondary | ICD-10-CM

## 2021-12-02 NOTE — Progress Notes (Signed)
Cardiology Office Note ? ?Date:  12/02/2021  ? ?ID:  Jerry Welch, DOB 1950/11/25, MRN [id number] ? ?PCP:  [name], MD  ? ?Chief Complaint  ?Patient presents with  ? 12 month follow up   ?  "Doing well." Medications reviewed by the patient verbally.   ? ? ?HPI:  ?Jerry Welch  is a pleasant 71 year old gentleman with history of obesity,  ?hypertension,  ?migraines  ?osteoarthritis  ?Smoked for 20 yr, until 1982  ?prostate cancer , resection 06/2017 ?who presents for follow up of his hypertension,  and shortness of breath. ? ?LOV 11/2020 ?On his last clinic visit changes made to his medications ?stop the valsartan HCTZ given his significant cramping ?Start valsartan 320 daily, Cardura 2 mg daily in place of HCTZ ? ?Did not toletrate change, went back on old meds ?Reports prior changes did not control his blood pressure, feels blood pressure well controlled male ? ?Seen in the emergency room for chest pain ?MRI of the brain shows no acute intracranial abnormalities.  Moderate chronic microvascular ischemic disease with a few small remote lacunar infarcts at the left pons. ? ?Has seen Dr. Brigitte Pulse had additional scans, no carotid disease ? ?Active at baseline with no complaints ?golf, rebuilds cars, 11 yr  grandson ? ?Lab work reviewed, sugars run high ?Chronic leg swelling ?Reports his cramps are not that bad on HCTZ ? ?Tolerating statin, no lipids since that time ? ?EKG personally reviewed by myself on todays visit ?Shows normal sinus rhythm with rate 66 bpm no significant ST or T wave changes ?  ?Other past medical history ?History of severe back pain.  trouble started 30 years ago with a bulge disc.  ?  ?ultrasound September 2013 showing hepatic steatohepatitis,  ?   ? ?PMH:   has a past medical history of Back pain, Basal cell carcinoma (05/17/2020), Basal cell carcinoma (05/17/2020), Basal cell carcinoma (02/17/2021), Dysplastic nevus (05/17/2020), Dysplastic nevus (08/10/2020), GERD (gastroesophageal  reflux disease), History of hiatal hernia, HLD (hyperlipidemia) (04/30/2015), Hypertension, Melanoma ([place]) (05/17/2020), Osteoarthrosis involving multiple sites, Prostate cancer ([place]) (2019), Ruptured lumbar disc, and Sleep apnea. ? ?PSH:    ?Past Surgical History:  ?Procedure Laterality Date  ? APPENDECTOMY    ? BACK SURGERY  09/2013  ? L4-L5   ? BASAL CELL CARCINOMA EXCISION  2012  ? right neck   ? PELVIC LYMPH NODE DISSECTION Bilateral 07/09/2017  ? Procedure: PELVIC LYMPH NODE DISSECTION;  Surgeon: Hollice Espy, MD;  Location: [facility] ORS;  Service: Urology;  Laterality: Bilateral;  ? PROSTATE BIOPSY    ? REPLACEMENT TOTAL KNEE  2002  ? left knee   ? REPLACEMENT TOTAL KNEE    ? right x 2  ? ROBOT ASSISTED LAPAROSCOPIC RADICAL PROSTATECTOMY N/A 07/09/2017  ? Procedure: ROBOTIC ASSISTED LAPAROSCOPIC RADICAL PROSTATECTOMY;  Surgeon: Hollice Espy, MD;  Location: [facility] ORS;  Service: Urology;  Laterality: N/A;  ? ? ?Current Outpatient Medications  ?Medication Sig Dispense Refill  ? aspirin 81 MG EC tablet Take 81 mg by mouth daily.    ? atorvastatin (LIPITOR) 40 MG tablet Take 40 mg by mouth daily.    ? cyanocobalamin (,VITAMIN B-12,) 1000 MCG/ML injection Inject 1,000 mcg into the muscle every 14 (fourteen) days.     ? diclofenac Sodium (VOLTAREN) 1 % GEL Apply 2 g topically 4 (four) times daily.    ? doxazosin (CARDURA) 2 MG tablet Take 1 tablet (2 mg total) by mouth 2 (two) times daily. 90 tablet 3  ? fluticasone (  FLONASE) 50 MCG/ACT nasal spray Place 1 spray into both nostrils 2 (two) times daily.    ? hydroxychloroquine (PLAQUENIL) 200 MG tablet Take 1 tablet by mouth 2 (two) times daily.    ? latanoprost (XALATAN) 0.005 % ophthalmic solution Place 1 drop into both eyes at bedtime.    ? loratadine (CLARITIN) 10 MG tablet Take 10 mg by mouth daily.    ? magnesium oxide (MAG-OX) 400 MG tablet Take 400 mg by mouth daily.    ? meclizine (ANTIVERT) 25 MG tablet Take 1 tablet (25 mg total) by mouth 2 (two) times daily  as needed for dizziness. 14 tablet 0  ? naproxen sodium (ANAPROX) 220 MG tablet Take 440 mg by mouth daily as needed (pain).    ? omeprazole (PRILOSEC) 40 MG capsule Take 40 mg by mouth daily.     ? potassium chloride (KLOR-CON) 10 MEQ tablet Take 10 mEq by mouth 3 (three) times daily.    ? valsartan-hydrochlorothiazide (DIOVAN-HCT) 320-25 MG tablet Take 1 tablet by mouth daily.    ? verapamil (CALAN-SR) 240 MG CR tablet Take 240 mg by mouth 2 (two) times daily.    ? ?No current facility-administered medications for this visit.  ? ? ? ?Allergies:   Amlodipine and Lisinopril  ? ?Social History:  The patient  reports that he quit smoking about 40 years ago. His smoking use included cigarettes. He smoked an average of 1 pack per day. He has quit using smokeless tobacco. He reports current alcohol use of about 1.0 standard drink per week. He reports that he does not use drugs.  ? ?Family History:   family history includes Heart attack in his maternal grandfather; Heart failure in his mother.  ? ? ?Review of Systems: ?Review of Systems  ?Constitutional: Negative.   ?HENT: Negative.    ?Respiratory: Negative.    ?Cardiovascular: Negative.   ?Gastrointestinal: Negative.   ?Musculoskeletal: Negative.   ?Neurological: Negative.   ?Psychiatric/Behavioral: Negative.    ?All other systems reviewed and are negative. ? ?PHYSICAL EXAM: ?VS:  BP 120/86 (BP Location: Left Arm, Patient Position: Sitting, Cuff Size: Normal)   Pulse 66   Ht '5\' 9"'$  (1.753 m)   Wt 205 lb 8 oz (93.2 kg)   SpO2 98%   BMI 30.35 kg/m?  , BMI Body mass index is 30.35 kg/m?Marland Kitchen ?Constitutional:  oriented to person, place, and time. No distress.  ?HENT:  ?Head: Grossly normal ?Eyes:  no discharge. No scleral icterus.  ?Neck: No JVD, no carotid bruits  ?Cardiovascular: Regular rate and rhythm, no murmurs appreciated ?Pulmonary/Chest: Clear to auscultation bilaterally, no wheezes or rails ?Abdominal: Soft.  no distension.  no tenderness.  ?Musculoskeletal:  Normal range of motion ?Neurological:  normal muscle tone. Coordination normal. No atrophy ?Skin: Skin warm and dry ?Psychiatric: normal affect, pleasant ? ? ?Recent Labs: ?07/01/2021: ALT 23; BUN 13; Creatinine, Ser 1.23; Hemoglobin 14.8; Platelets 192; Potassium 3.5; Sodium 139  ? ? ?Lipid Panel ?No results found for: CHOL, HDL, LDLCALC, TRIG ?  ? ?Wt Readings from Last 3 Encounters:  ?12/02/21 205 lb 8 oz (93.2 kg)  ?07/11/21 202 lb 14.4 oz (92 kg)  ?07/01/21 200 lb (90.7 kg)  ?  ? ?ASSESSMENT AND PLAN: ? ?Essential hypertension ?Blood pressure is well controlled on today's visit. No changes made to the medications. ? ?Mixed hyperlipidemia ?On a statin given microvascular disease on MRI, prior small strokes ?No medication changes made, repeat lipid panel through primary care ? ?Leg swelling ?Compression hose,  likely exacerbated by verapamil,  ?Reports is not bothered by mild lower extremity swelling ? ?Class 2 obesity due to excess calories without serious comorbidity with body mass index (BMI) of 35.0 to 35.9 in adult ?Elevated glucose, numbers discussed, low carbohydrate diet recommended ? ? ? Total encounter time more than 30 minutes ? Greater than 50% was spent in counseling and coordination of care with the patient ? ? ?Disposition:   F/U 12 months ? ? ?No orders of the defined types were placed in this encounter. ? ? ? ?Signed, ?Esmond Plants, M.D., Ph.D. ?12/02/2021  ?[place] ?[phone] ? ?

## 2021-12-02 NOTE — Patient Instructions (Signed)
Medication Instructions:  No changes  If you need a refill on your cardiac medications before your next appointment, please call your pharmacy.   Lab work: No new labs needed  Testing/Procedures: No new testing needed  Follow-Up: At [facility], you and your health needs are our priority.  As part of our continuing mission to provide you with exceptional heart care, we have created designated Provider Care Teams.  These Care Teams include your primary Cardiologist (physician) and Advanced Practice Providers (APPs -  Physician Assistants and Nurse Practitioners) who all work together to provide you with the care you need, when you need it.  You will need a follow up appointment in 12 months  Providers on your designated Care Team:   Christopher Berge, NP Ryan Dunn, PA-C Cadence Furth, PA-C  COVID-19 Vaccine Information can be found at: [email or web address] For questions related to vaccine distribution or appointments, please email [email or web address] or call [phone].   

## 2021-12-09 ENCOUNTER — Other Ambulatory Visit: Payer: Medicare Other

## 2021-12-12 ENCOUNTER — Other Ambulatory Visit: Payer: PPO

## 2021-12-13 ENCOUNTER — Ambulatory Visit: Payer: Medicare Other | Admitting: Urology

## 2021-12-14 ENCOUNTER — Telehealth: Payer: Self-pay | Admitting: Urology

## 2021-12-14 NOTE — Telephone Encounter (Signed)
Please call Jerry Welch and have him reschedule his missed appointment from yesterday for follow up on his prostate cancer.  ?

## 2021-12-15 NOTE — Telephone Encounter (Signed)
Pt states he had an emergency and had to go out of town. Lab appt and OV rescheduled. Patient confirmed.  ?

## 2021-12-16 ENCOUNTER — Other Ambulatory Visit: Payer: PPO

## 2021-12-19 ENCOUNTER — Other Ambulatory Visit: Payer: PPO

## 2021-12-19 ENCOUNTER — Other Ambulatory Visit: Payer: Self-pay

## 2021-12-19 DIAGNOSIS — C61 Malignant neoplasm of prostate: Secondary | ICD-10-CM

## 2021-12-20 LAB — PSA: Prostate Specific Ag, Serum: <0.1 ng/mL (ref 0.0–4.0)

## 2021-12-28 ENCOUNTER — Ambulatory Visit: Payer: PPO | Admitting: Urology

## 2022-04-04 DIAGNOSIS — M25542 Pain in joints of left hand: Secondary | ICD-10-CM | POA: Diagnosis not present

## 2022-04-04 DIAGNOSIS — M25541 Pain in joints of right hand: Secondary | ICD-10-CM | POA: Diagnosis not present

## 2022-04-04 DIAGNOSIS — M1120 Other chondrocalcinosis, unspecified site: Secondary | ICD-10-CM | POA: Diagnosis not present

## 2022-04-04 DIAGNOSIS — Z79899 Other long term (current) drug therapy: Secondary | ICD-10-CM | POA: Diagnosis not present

## 2022-04-10 DIAGNOSIS — E785 Hyperlipidemia, unspecified: Secondary | ICD-10-CM | POA: Diagnosis not present

## 2022-04-10 DIAGNOSIS — M1120 Other chondrocalcinosis, unspecified site: Secondary | ICD-10-CM | POA: Diagnosis not present

## 2022-04-10 DIAGNOSIS — Z125 Encounter for screening for malignant neoplasm of prostate: Secondary | ICD-10-CM | POA: Diagnosis not present

## 2022-04-10 DIAGNOSIS — I10 Essential (primary) hypertension: Secondary | ICD-10-CM | POA: Diagnosis not present

## 2022-04-10 DIAGNOSIS — I679 Cerebrovascular disease, unspecified: Secondary | ICD-10-CM | POA: Diagnosis not present

## 2022-07-10 ENCOUNTER — Ambulatory Visit: Payer: Medicare Other | Admitting: Internal Medicine

## 2022-07-10 NOTE — Progress Notes (Signed)
Pt did not show for scheduled appointment. Attempts made to reach pt unsuccessful.

## 2022-08-03 DIAGNOSIS — M5416 Radiculopathy, lumbar region: Secondary | ICD-10-CM | POA: Diagnosis not present

## 2022-08-03 DIAGNOSIS — M9903 Segmental and somatic dysfunction of lumbar region: Secondary | ICD-10-CM | POA: Diagnosis not present

## 2022-08-03 DIAGNOSIS — M9905 Segmental and somatic dysfunction of pelvic region: Secondary | ICD-10-CM | POA: Diagnosis not present

## 2022-08-03 DIAGNOSIS — M5417 Radiculopathy, lumbosacral region: Secondary | ICD-10-CM | POA: Diagnosis not present

## 2022-08-07 DIAGNOSIS — M9903 Segmental and somatic dysfunction of lumbar region: Secondary | ICD-10-CM | POA: Diagnosis not present

## 2022-08-07 DIAGNOSIS — M5416 Radiculopathy, lumbar region: Secondary | ICD-10-CM | POA: Diagnosis not present

## 2022-08-07 DIAGNOSIS — M9905 Segmental and somatic dysfunction of pelvic region: Secondary | ICD-10-CM | POA: Diagnosis not present

## 2022-08-07 DIAGNOSIS — M5417 Radiculopathy, lumbosacral region: Secondary | ICD-10-CM | POA: Diagnosis not present

## 2022-08-11 DIAGNOSIS — M9903 Segmental and somatic dysfunction of lumbar region: Secondary | ICD-10-CM | POA: Diagnosis not present

## 2022-08-11 DIAGNOSIS — M5416 Radiculopathy, lumbar region: Secondary | ICD-10-CM | POA: Diagnosis not present

## 2022-08-11 DIAGNOSIS — M5417 Radiculopathy, lumbosacral region: Secondary | ICD-10-CM | POA: Diagnosis not present

## 2022-08-11 DIAGNOSIS — M9905 Segmental and somatic dysfunction of pelvic region: Secondary | ICD-10-CM | POA: Diagnosis not present

## 2022-08-30 ENCOUNTER — Telehealth: Payer: Self-pay | Admitting: Urology

## 2022-08-30 NOTE — Telephone Encounter (Signed)
Jerry Welch will need an office visit in March with PSA prior for prostate cancer monitoring.

## 2022-08-31 NOTE — Telephone Encounter (Signed)
LMOM for pt to return call to schedule appt.

## 2022-09-13 IMAGING — MR MR MRA NECK W/O CM
2 series · 19 of 32 positions shown · non-contrast
Comparison: 07/01/2021 MRI brain, no prior MRA.

CLINICAL DATA: Stroke follow-up

EXAM:
MRA NECK WITHOUT CONTRAST
MRA HEAD WITHOUT CONTRAST
TECHNIQUE: Angiographic images of the Circle of Willis were acquired using MRA
technique without intravenous contrast.

[Series 1033: rcca · 0.43mm/px · 10 of 13 slices shown]
[im 1/13]
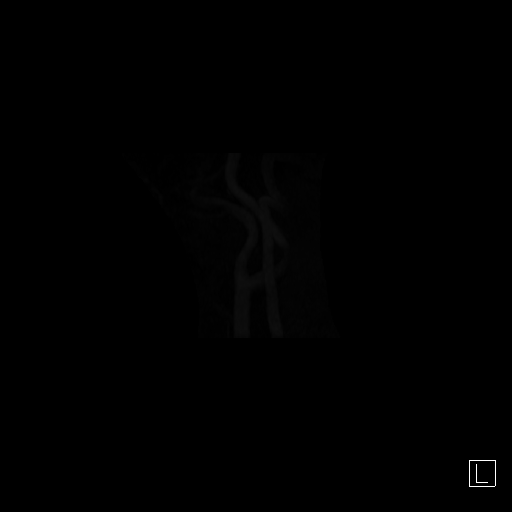
[im 2/13]
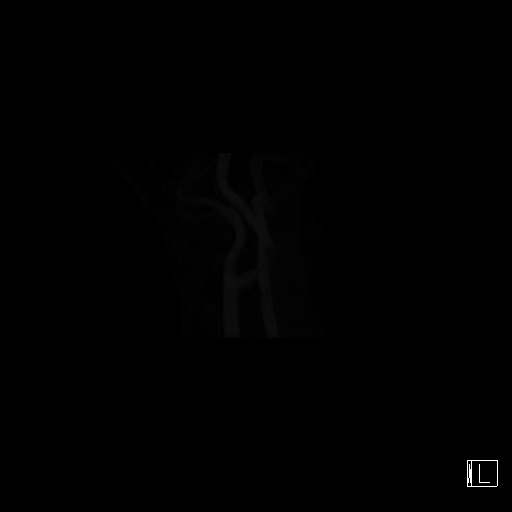
[im 3/13]
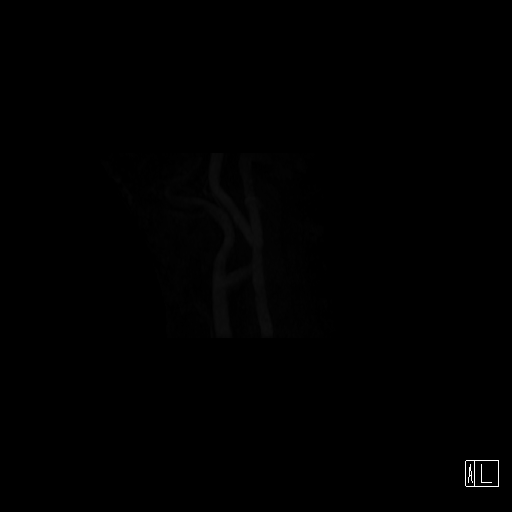
[im 5/13]
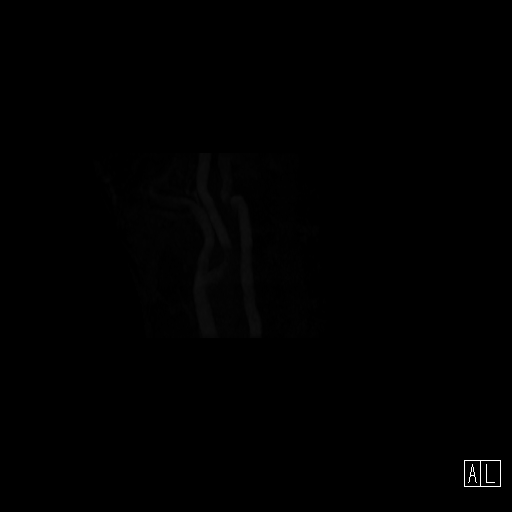
[im 6/13]
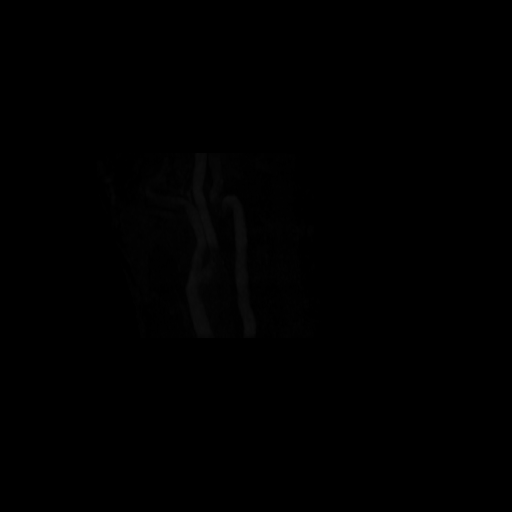
[im 7/13]
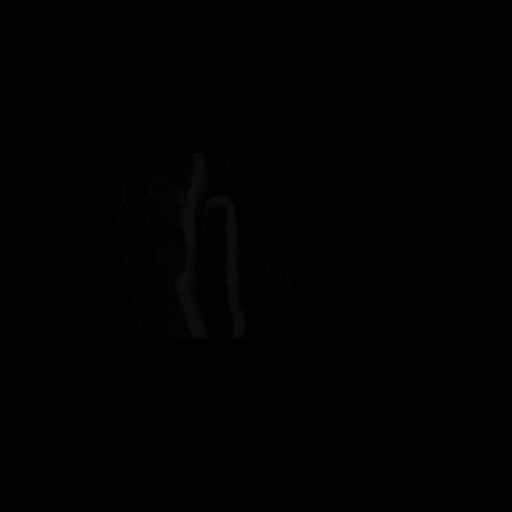
[im 8/13]
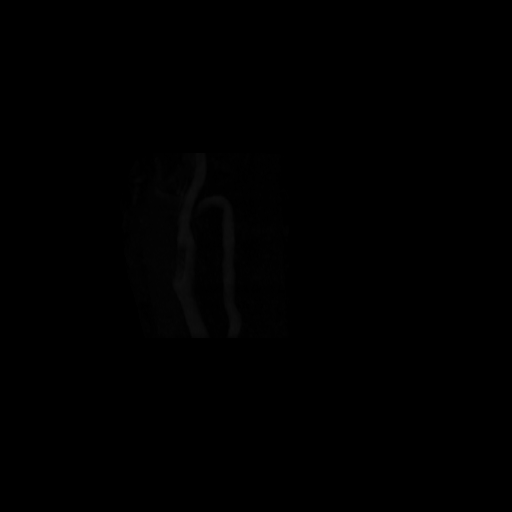
[im 9/13]
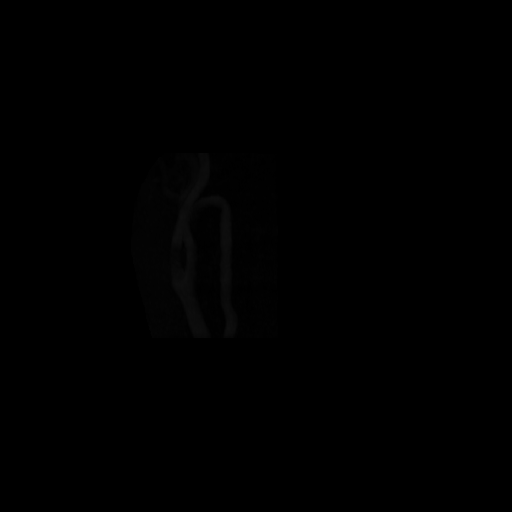
[im 11/13]
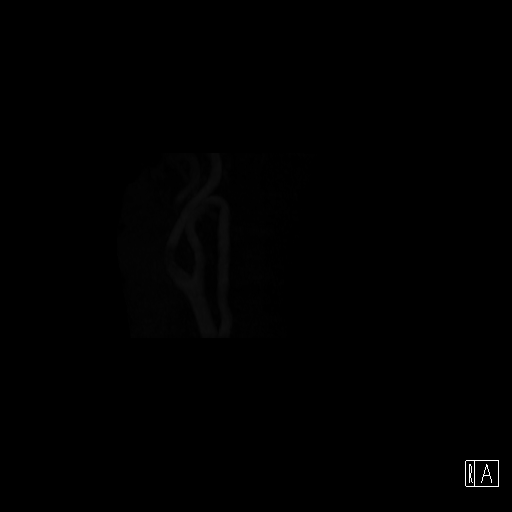
[im 13/13]
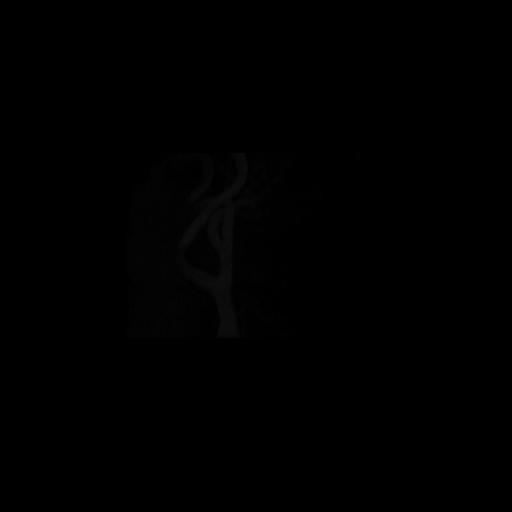

[Series 1037: lcca · 0.43mm/px · 9 of 19 slices shown]
[im 2/19]
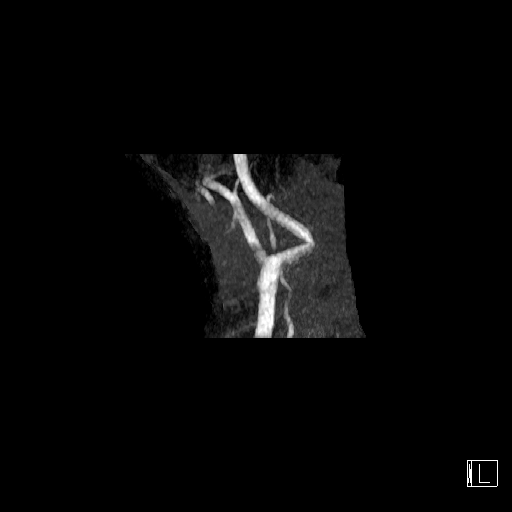
[im 4/19]
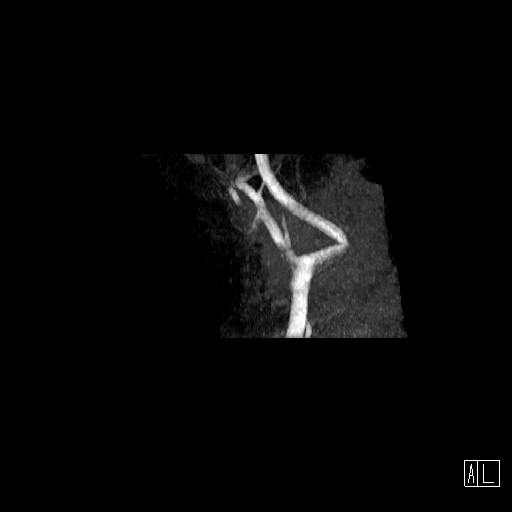
[im 6/19]
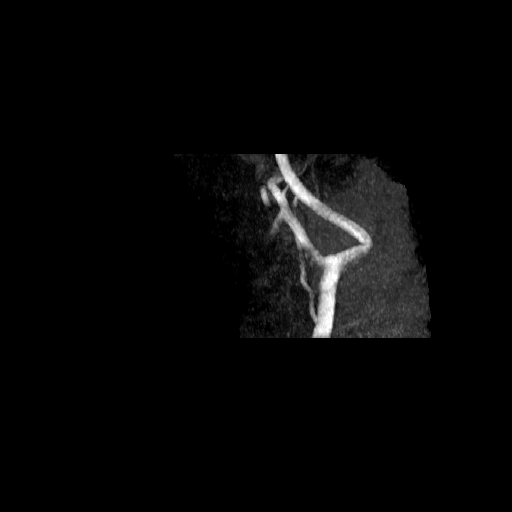
[im 9/19]
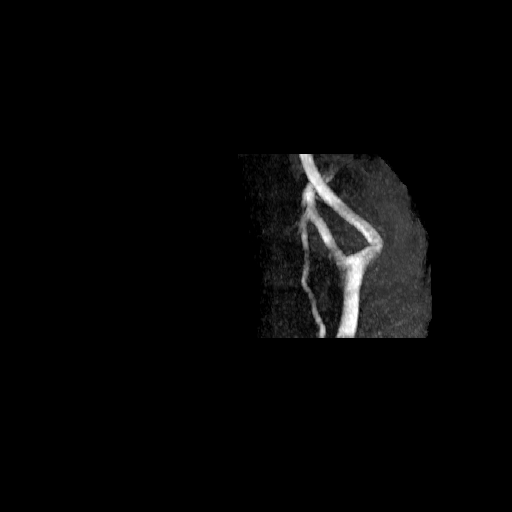
[im 10/19]
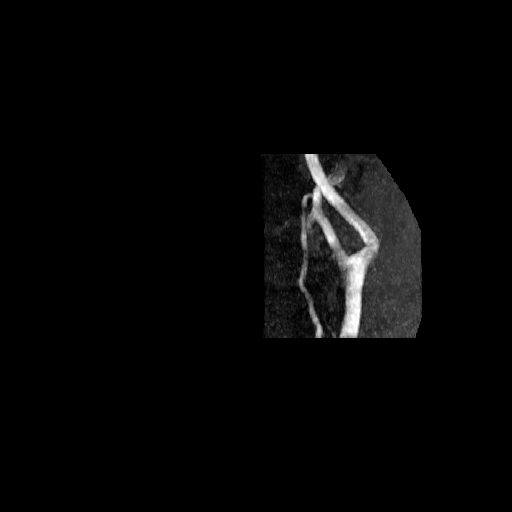
[im 11/19]
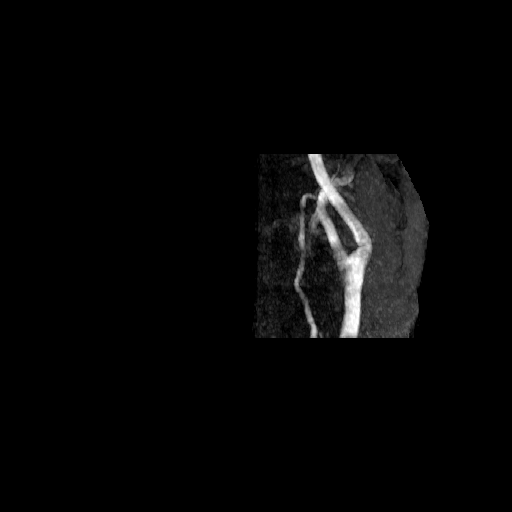
[im 14/19]
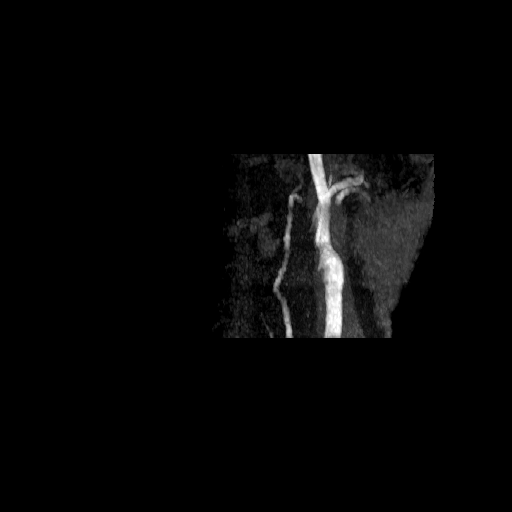
[im 16/19]
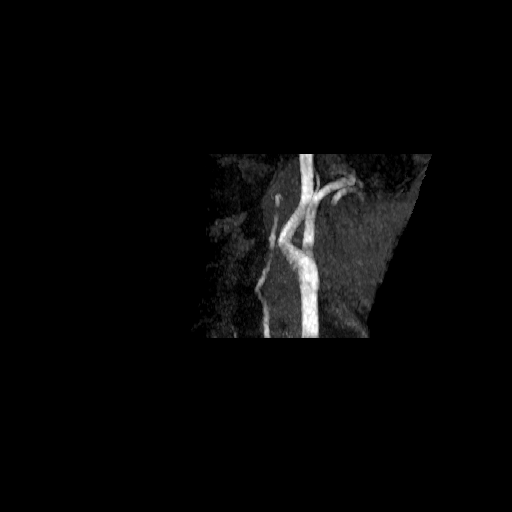
[im 18/19]
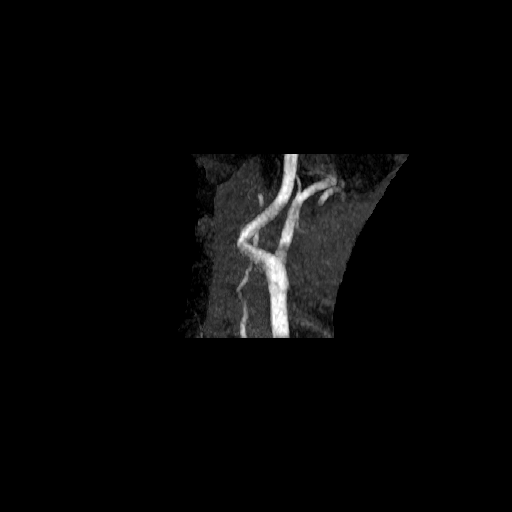

[19 of 32 positions shown; findings below may reference images not displayed]

FINDINGS: MRA NECK FINDINGS

The imaged bilateral carotid arteries demonstrate no significant
stenosis, dissection, or occlusion.

The vertebral arteries are primarily imaged in the V2 and proximal
V3 segments. The left vertebral artery is relatively diminutive. No
hemodynamically significant stenosis in the imaged vertebral
arteries. No occlusion or dissection.

MRA HEAD FINDINGS

Both internal carotid arteries are patent to the termini, without
stenosis or other abnormality. A1 segments patent. Hypoplastic right
A1 normal anterior communicating artery. Anterior cerebral arteries
are patent to their distal aspects. Mild narrowing of the distal
left M1 (series 5, image 120). No other M1 stenosis or occlusion.
Normal MCA bifurcations. Distal MCA branches perfused and symmetric.

Vertebral arteries are patent. The left vertebral artery is
diminutive and primarily supplies the left PICA. Posterior inferior
cerebral arteries patent bilaterally. Basilar patent to its distal
aspect. Superior cerebral arteries patent bilaterally. PCAs well
perfused to their distal aspects without stenosis. The left
posterior communicating artery is not visualized.
IMPRESSION: 1. No intracranial large vessel occlusion or significant stenosis.
2. No hemodynamically significant stenosis in the imaged portions of
the carotid and vertebral arteries.

## 2022-10-11 DIAGNOSIS — B9689 Other specified bacterial agents as the cause of diseases classified elsewhere: Secondary | ICD-10-CM | POA: Diagnosis not present

## 2022-10-11 DIAGNOSIS — U071 COVID-19: Secondary | ICD-10-CM | POA: Diagnosis not present

## 2022-10-11 DIAGNOSIS — Z03818 Encounter for observation for suspected exposure to other biological agents ruled out: Secondary | ICD-10-CM | POA: Diagnosis not present

## 2022-10-11 DIAGNOSIS — J019 Acute sinusitis, unspecified: Secondary | ICD-10-CM | POA: Diagnosis not present

## 2022-10-11 DIAGNOSIS — J209 Acute bronchitis, unspecified: Secondary | ICD-10-CM | POA: Diagnosis not present

## 2022-10-13 NOTE — Progress Notes (Signed)
[facility] [place], [place] [address]  Pulmonary Sleep Medicine   Office Visit Note  Patient Name: Jerry Welch DOB: 1951/01/21 MRN [id number]    Chief Complaint: Obstructive Sleep Apnea visit  Brief History:  Jerry Welch is seen today for an annual follow up visit for APAP@ 10-20 cmH2O. The patient has a 8 year history of sleep apnea. Patient is using PAP nightly.  The patient feels rested after sleeping with PAP.  The patient reports benefiting from PAP use. Reported sleepiness is  improved and the Epworth Sleepiness Score is 6 out of 24. The patient does not take naps. The patient complains of the following: Some dry mouth, we discussed how to adjust his humidity level.  He also thinks his mouth may be dropping open, he uses a chinstrap and is open to trying a full face mask.  The compliance download shows 97% compliance with an average use time of 7 hours 37 minutes. The AHI is 5.6.  The patient does not complain of limb movements disrupting sleep. The patient continues to require PAP therapy in order to eliminate sleep apnea.   ROS  General: (-) fever, (-) chills, (-) night sweat Nose and Sinuses: (-) nasal stuffiness or itchiness, (-) postnasal drip, (-) nosebleeds, (-) sinus trouble. Mouth and Throat: (-) sore throat, (-) hoarseness. Neck: (-) swollen glands, (-) enlarged thyroid, (-) neck pain. Respiratory: - cough, - shortness of breath, - wheezing. Neurologic: - numbness, - tingling. Psychiatric: - anxiety, - depression   Current Medication: Outpatient Encounter Medications as of 10/16/2022  Medication Sig   aspirin 81 MG EC tablet Take 81 mg by mouth daily.   atorvastatin (LIPITOR) 40 MG tablet Take 40 mg by mouth daily.   cyanocobalamin (,VITAMIN B-12,) 1000 MCG/ML injection Inject 1,000 mcg into the muscle every 14 (fourteen) days.    diclofenac Sodium (VOLTAREN) 1 % GEL Apply 2 g topically 4 (four) times daily.   doxazosin (CARDURA) 2  MG tablet Take 1 tablet (2 mg total) by mouth 2 (two) times daily.   fluticasone (FLONASE) 50 MCG/ACT nasal spray Place 1 spray into both nostrils 2 (two) times daily.   hydroxychloroquine (PLAQUENIL) 200 MG tablet Take 1 tablet by mouth 2 (two) times daily.   latanoprost (XALATAN) 0.005 % ophthalmic solution Place 1 drop into both eyes at bedtime.   loratadine (CLARITIN) 10 MG tablet Take 10 mg by mouth daily.   magnesium oxide (MAG-OX) 400 MG tablet Take 400 mg by mouth daily.   meclizine (ANTIVERT) 25 MG tablet Take 1 tablet (25 mg total) by mouth 2 (two) times daily as needed for dizziness.   naproxen sodium (ANAPROX) 220 MG tablet Take 440 mg by mouth daily as needed (pain).   omeprazole (PRILOSEC) 40 MG capsule Take 40 mg by mouth daily.    potassium chloride (KLOR-CON) 10 MEQ tablet Take 10 mEq by mouth 3 (three) times daily.   valsartan-hydrochlorothiazide (DIOVAN-HCT) 320-25 MG tablet Take 1 tablet by mouth daily.   verapamil (CALAN-SR) 240 MG CR tablet Take 240 mg by mouth 2 (two) times daily.   No facility-administered encounter medications on file as of 10/16/2022.    Surgical History: Past Surgical History:  Procedure Laterality Date   APPENDECTOMY     BACK SURGERY  09/2013   L4-L5    BASAL CELL CARCINOMA EXCISION  2012   right neck    PELVIC LYMPH NODE DISSECTION Bilateral 07/09/2017   Procedure: PELVIC LYMPH NODE DISSECTION;  Surgeon: Erlene Quan,  Caryl Pina, MD;  Location: [facility] ORS;  Service: Urology;  Laterality: Bilateral;   PROSTATE BIOPSY     REPLACEMENT TOTAL KNEE  2002   left knee    REPLACEMENT TOTAL KNEE     right x 2   ROBOT ASSISTED LAPAROSCOPIC RADICAL PROSTATECTOMY N/A 07/09/2017   Procedure: ROBOTIC ASSISTED LAPAROSCOPIC RADICAL PROSTATECTOMY;  Surgeon: Hollice Espy, MD;  Location: [facility] ORS;  Service: Urology;  Laterality: N/A;    Medical History: Past Medical History:  Diagnosis Date   Back pain    Basal cell carcinoma 05/17/2020   Right top of shoulder.  Nodular pattern. EDC   Basal cell carcinoma 05/17/2020   Left inferior scapula. Nodular pattern. [facility] 07/27/20   Basal cell carcinoma 02/17/2021   L neck post auricular - ED&C    Dysplastic nevus 05/17/2020   Left mid to upper back 6cm lat to spine. Mild atypia, limited margins free.    Dysplastic nevus 08/10/2020   left pubic area, mild atypia, limited margins free   GERD (gastroesophageal reflux disease)    History of hiatal hernia    HLD (hyperlipidemia) 04/30/2015   Hypertension    Melanoma ([place]) 05/17/2020   Mid to upper back, left paraspinal. MMIS, lateral margin involved. exc 07/20/20, margins free.   Osteoarthrosis involving multiple sites    Prostate cancer ([place]) 2019   prostatectomy   Ruptured lumbar disc    L4 & L5   Sleep apnea     Family History: Non contributory to the present illness  Social History: Social History   Socioeconomic History   Marital status: Married    Spouse name: Not on file   Number of children: Not on file   Years of education: Not on file   Highest education level: Not on file  Occupational History   Not on file  Tobacco Use   Smoking status: Former    Packs/day: 1.00    Types: Cigarettes    Quit date: 06/06/1981    Years since quitting: 41.3   Smokeless tobacco: Former  Scientific laboratory technician Use: Never used  Substance and Sexual Activity   Alcohol use: Yes    Alcohol/week: 1.0 standard drink of alcohol    Types: 1 Standard drinks or equivalent per week   Drug use: No   Sexual activity: Yes    Birth control/protection: None  Other Topics Concern   Not on file  Social History Narrative   Not on file   Social Determinants of Health   Financial Resource Strain: Not on file  Food Insecurity: Not on file  Transportation Needs: Not on file  Physical Activity: Not on file  Stress: Not on file  Social Connections: Not on file  Intimate Partner Violence: Not on file    Vital Signs: Blood pressure (!) 156/87, pulse 62, resp. rate  16, height '5\' 10"'$  (1.778 m), weight 202 lb (91.6 kg), SpO2 96 %. Body mass index is 28.98 kg/m.    Examination: General Appearance: The patient is well-developed, well-nourished, and in no distress. Neck Circumference: 43 cm Skin: Gross inspection of skin unremarkable. Head: normocephalic, no gross deformities. Eyes: no gross deformities noted. ENT: ears appear grossly normal Neurologic: Alert and oriented. No involuntary movements.  STOP BANG RISK ASSESSMENT S (snore) Have you been told that you snore?     NO   T (tired) Are you often tired, fatigued, or sleepy during the day?   NO  O (obstruction) Do you stop breathing, choke, or gasp  during sleep? NO   P (pressure) Do you have or are you being treated for high blood pressure? YES   B (BMI) Is your body index greater than 35 kg/m? NO   A (age) Are you 40 years old or older? YES   N (neck) Do you have a neck circumference greater than 16 inches?   YES   G (gender) Are you a male? YES   TOTAL STOP/BANG "YES" ANSWERS 4       A STOP-Bang score of 2 or less is considered low risk, and a score of 5 or more is high risk for having either moderate or severe OSA. For people who score 3 or 4, doctors may need to perform further assessment to determine how likely they are to have OSA.         EPWORTH SLEEPINESS SCALE:  Scale:  (0)= no chance of dozing; (1)= slight chance of dozing; (2)= moderate chance of dozing; (3)= high chance of dozing  Chance  Situtation    Sitting and reading: 1    Watching TV: 1    Sitting Inactive in public: 0    As a passenger in car: 1      Lying down to rest: 2    Sitting and talking: 0    Sitting quielty after lunch: 1    In a car, stopped in traffic: 0   TOTAL SCORE:   6 out of 24    SLEEP STUDIES:  HST (06/22/15)  AHI 39.8, SUPINE AHI 57.1, SPO2  75% Titration (07/22/15) APAP@ 10-20 cmH2O   CPAP COMPLIANCE DATA:  Date Range: 10/12/2021-10/11/2022  Average Daily Use: 7  hours 37 minutes  Median Use: 7 hours 50 minutes  Compliance for > 4 Hours: 97%  AHI: 5.6 respiratory events per hour  Days Used: 360/365 days  Mask Leak: 39.5   95th Percentile Pressure: 12.2         LABS: No results found for this or any previous visit (from the past 2160 hour(s)).  Radiology: MR ANGIO HEAD WO CONTRAST  Result Date: 07/27/2021 CLINICAL DATA:  Stroke follow-up EXAM: MRA NECK WITHOUT CONTRAST MRA HEAD WITHOUT CONTRAST TECHNIQUE: Angiographic images of the Circle of Willis were acquired using MRA technique without intravenous contrast. COMPARISON:  07/01/2021 MRI brain, no prior MRA. FINDINGS: MRA NECK FINDINGS The imaged bilateral carotid arteries demonstrate no significant stenosis, dissection, or occlusion. The vertebral arteries are primarily imaged in the V2 and proximal V3 segments. The left vertebral artery is relatively diminutive. No hemodynamically significant stenosis in the imaged vertebral arteries. No occlusion or dissection. MRA HEAD FINDINGS Both internal carotid arteries are patent to the termini, without stenosis or other abnormality. A1 segments patent. Hypoplastic right A1 normal anterior communicating artery. Anterior cerebral arteries are patent to their distal aspects. Mild narrowing of the distal left M1 (series 5, image 120). No other M1 stenosis or occlusion. Normal MCA bifurcations. Distal MCA branches perfused and symmetric. Vertebral arteries are patent. The left vertebral artery is diminutive and primarily supplies the left PICA. Posterior inferior cerebral arteries patent bilaterally. Basilar patent to its distal aspect. Superior cerebral arteries patent bilaterally. PCAs well perfused to their distal aspects without stenosis. The left posterior communicating artery is not visualized. IMPRESSION: 1. No intracranial large vessel occlusion or significant stenosis. 2. No hemodynamically significant stenosis in the imaged portions of the carotid  and vertebral arteries. Electronically Signed   By: Merilyn Baba M.D.   On: 07/27/2021 11:12   MR  ANGIO NECK WO CONTRAST  Result Date: 07/27/2021 CLINICAL DATA:  Stroke follow-up EXAM: MRA NECK WITHOUT CONTRAST MRA HEAD WITHOUT CONTRAST TECHNIQUE: Angiographic images of the Circle of Willis were acquired using MRA technique without intravenous contrast. COMPARISON:  07/01/2021 MRI brain, no prior MRA. FINDINGS: MRA NECK FINDINGS The imaged bilateral carotid arteries demonstrate no significant stenosis, dissection, or occlusion. The vertebral arteries are primarily imaged in the V2 and proximal V3 segments. The left vertebral artery is relatively diminutive. No hemodynamically significant stenosis in the imaged vertebral arteries. No occlusion or dissection. MRA HEAD FINDINGS Both internal carotid arteries are patent to the termini, without stenosis or other abnormality. A1 segments patent. Hypoplastic right A1 normal anterior communicating artery. Anterior cerebral arteries are patent to their distal aspects. Mild narrowing of the distal left M1 (series 5, image 120). No other M1 stenosis or occlusion. Normal MCA bifurcations. Distal MCA branches perfused and symmetric. Vertebral arteries are patent. The left vertebral artery is diminutive and primarily supplies the left PICA. Posterior inferior cerebral arteries patent bilaterally. Basilar patent to its distal aspect. Superior cerebral arteries patent bilaterally. PCAs well perfused to their distal aspects without stenosis. The left posterior communicating artery is not visualized. IMPRESSION: 1. No intracranial large vessel occlusion or significant stenosis. 2. No hemodynamically significant stenosis in the imaged portions of the carotid and vertebral arteries. Electronically Signed   By: Merilyn Baba M.D.   On: 07/27/2021 11:12    No results found.  No results found.    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 OSA on CPAP The patient does tolerate PAP and reports  benefit from PAP use. He has slightly elevated AHI showing his apnea is not optimally controlled. He has significant mask leak and is mouth venting. He is willing to try a full face mask. He will get a mask fit and we will do a download 3 weeks after. If not controlled, pt will need to try a side sleep positioning device. The patient was reminded how to clean equipment and advised to replace supplies routinely. The patient was also counselled on weight loss. The compliance is excellent. The AHI is 5.6.   OSA on cpap- not optimally controlled. Switch to full face mask to correct leak/mouth venting. 3 week download. If no improvement then we will have him try a side sleep positioning device. F/u one year  2. CPAP use counseling CPAP Counseling: had a lengthy discussion with the patient regarding the importance of PAP therapy in management of the sleep apnea. Patient appears to understand the risk factor reduction and also understands the risks associated with untreated sleep apnea. Patient will try to make a good faith effort to remain  compliant with therapy. Also instructed the patient on proper cleaning of the device including the water must be changed daily if possible and use of distilled water is preferred. Patient understands that the machine should be regularly cleaned with appropriate recommended cleaning solutions that do not damage the PAP machine for example given white vinegar and water rinses. Other methods such as ozone treatment may not be as good as these simple methods to achieve cleaning.   3. Primary hypertension Hypertension Counseling:   The following hypertensive lifestyle modification were recommended and discussed:  1. Limiting alcohol intake to less than 1 oz/day of ethanol:(24 oz of beer or 8 oz of wine or 2 oz of 100-proof whiskey). 2. Take baby ASA 81 mg daily. 3. Importance of regular aerobic exercise and losing weight. 4. Reduce dietary saturated fat and cholesterol intake for overall cardiovascular health. 5. Maintaining adequate dietary potassium, calcium, and magnesium intake. 6. Regular monitoring of the blood pressure. 7. Reduce sodium intake to less than 100 mmol/day (less than 2.3 gm of sodium or less than 6 gm of sodium choride)       General Counseling: I have discussed the findings of the evaluation and examination with Pearson.  I have also discussed any further diagnostic evaluation thatmay be needed or ordered today. Ulrick verbalizes understanding of the findings of todays visit. We also reviewed his medications today and discussed drug interactions and side effects including but not limited excessive drowsiness and altered mental states. We also discussed that there is always a risk not just to him but also people around him. he has been encouraged to call the office with any questions or concerns that should arise related to todays visit.  No orders of the defined types were placed in this encounter.       I have personally obtained a history, examined the patient, evaluated  laboratory and imaging results, formulated the assessment and plan and placed orders. This patient was seen today by Tressie Ellis, PA-C in collaboration with Dr. Devona Konig.   Allyne Gee, MD [facility] Diplomate ABMS Pulmonary Critical Care Medicine and Sleep Medicine

## 2022-10-16 ENCOUNTER — Ambulatory Visit (INDEPENDENT_AMBULATORY_CARE_PROVIDER_SITE_OTHER): Payer: PPO | Admitting: Internal Medicine

## 2022-10-16 VITALS — BP 156/87 | HR 62 | Resp 16 | Ht 70.0 in | Wt 202.0 lb

## 2022-10-16 DIAGNOSIS — Z7189 Other specified counseling: Secondary | ICD-10-CM

## 2022-10-16 DIAGNOSIS — G4733 Obstructive sleep apnea (adult) (pediatric): Secondary | ICD-10-CM | POA: Diagnosis not present

## 2022-10-16 DIAGNOSIS — I10 Essential (primary) hypertension: Secondary | ICD-10-CM | POA: Diagnosis not present

## 2022-10-16 NOTE — Patient Instructions (Signed)

## 2022-10-18 DIAGNOSIS — E785 Hyperlipidemia, unspecified: Secondary | ICD-10-CM | POA: Diagnosis not present

## 2022-10-18 DIAGNOSIS — Z125 Encounter for screening for malignant neoplasm of prostate: Secondary | ICD-10-CM | POA: Diagnosis not present

## 2022-10-18 DIAGNOSIS — I10 Essential (primary) hypertension: Secondary | ICD-10-CM | POA: Diagnosis not present

## 2022-10-23 ENCOUNTER — Ambulatory Visit: Payer: PPO | Admitting: Dermatology

## 2022-10-23 VITALS — BP 154/90 | HR 88

## 2022-10-23 DIAGNOSIS — Z86007 Personal history of in-situ neoplasm of skin: Secondary | ICD-10-CM

## 2022-10-23 DIAGNOSIS — L820 Inflamed seborrheic keratosis: Secondary | ICD-10-CM

## 2022-10-23 DIAGNOSIS — L578 Other skin changes due to chronic exposure to nonionizing radiation: Secondary | ICD-10-CM

## 2022-10-23 DIAGNOSIS — L814 Other melanin hyperpigmentation: Secondary | ICD-10-CM

## 2022-10-23 DIAGNOSIS — D229 Melanocytic nevi, unspecified: Secondary | ICD-10-CM

## 2022-10-23 DIAGNOSIS — Z86018 Personal history of other benign neoplasm: Secondary | ICD-10-CM

## 2022-10-23 DIAGNOSIS — Z872 Personal history of diseases of the skin and subcutaneous tissue: Secondary | ICD-10-CM | POA: Diagnosis not present

## 2022-10-23 DIAGNOSIS — Z85820 Personal history of malignant melanoma of skin: Secondary | ICD-10-CM

## 2022-10-23 DIAGNOSIS — L821 Other seborrheic keratosis: Secondary | ICD-10-CM

## 2022-10-23 DIAGNOSIS — Z85828 Personal history of other malignant neoplasm of skin: Secondary | ICD-10-CM

## 2022-10-23 NOTE — Patient Instructions (Addendum)
Seborrheic Keratosis  What causes seborrheic keratoses? Seborrheic keratoses are harmless, common skin growths that first appear during adult life.  As time goes by, more growths appear.  Some people may develop a large number of them.  Seborrheic keratoses appear on both covered and uncovered body parts.  They are not caused by sunlight.  The tendency to develop seborrheic keratoses can be inherited.  They vary in color from skin-colored to gray, brown, or even black.  They can be either smooth or have a rough, warty surface.   Seborrheic keratoses are superficial and look as if they were stuck on the skin.  Under the microscope this type of keratosis looks like layers upon layers of skin.  That is why at times the top layer may seem to fall off, but the rest of the growth remains and re-grows.    Treatment Seborrheic keratoses do not need to be treated, but can easily be removed in the office.  Seborrheic keratoses often cause symptoms when they rub on clothing or jewelry.  Lesions can be in the way of shaving.  If they become inflamed, they can cause itching, soreness, or burning.  Removal of a seborrheic keratosis can be accomplished by freezing, burning, or surgery. If any spot bleeds, scabs, or grows rapidly, please return to have it checked, as these can be an indication of a skin cancer.  Cryotherapy Aftercare  Wash gently with soap and water everyday.   Apply Vaseline and Band-Aid daily until healed.       Melanoma ABCDEs  Melanoma is the most dangerous type of skin cancer, and is the leading cause of death from skin disease.  You are more likely to develop melanoma if you: Have light-colored skin, light-colored eyes, or red or blond hair Spend a lot of time in the sun Tan regularly, either outdoors or in a tanning bed Have had blistering sunburns, especially during childhood Have a close family member who has had a melanoma Have atypical moles or large birthmarks  Early  detection of melanoma is key since treatment is typically straightforward and cure rates are extremely high if we catch it early.   The first sign of melanoma is often a change in a mole or a new dark spot.  The ABCDE system is a way of remembering the signs of melanoma.  A for asymmetry:  The two halves do not match. B for border:  The edges of the growth are irregular. C for color:  A mixture of colors are present instead of an even brown color. D for diameter:  Melanomas are usually (but not always) greater than 6mm - the size of a pencil eraser. E for evolution:  The spot keeps changing in size, shape, and color.  Please check your skin once per month between visits. You can use a small mirror in front and a large mirror behind you to keep an eye on the back side or your body.   If you see any new or changing lesions before your next follow-up, please call to schedule a visit.  Please continue daily skin protection including broad spectrum sunscreen SPF 30+ to sun-exposed areas, reapplying every 2 hours as needed when you're outdoors.   Staying in the shade or wearing long sleeves, sun glasses (UVA+UVB protection) and wide brim hats (4-inch brim around the entire circumference of the hat) are also recommended for sun protection.     Due to recent changes in healthcare laws, you may see results of   your pathology and/or laboratory studies on MyChart before the doctors have had a chance to review them. We understand that in some cases there may be results that are confusing or concerning to you. Please understand that not all results are received at the same time and often the doctors may need to interpret multiple results in order to provide you with the best plan of care or course of treatment. Therefore, we ask that you please give us 2 business days to thoroughly review all your results before contacting the office for clarification. Should we see a critical lab result, you will be contacted  sooner.   If You Need Anything After Your Visit  If you have any questions or concerns for your doctor, please call our main line at [phone] and press option 4 to reach your doctor's medical assistant. If no one answers, please leave a voicemail as directed and we will return your call as soon as possible. Messages left after 4 pm will be answered the following business day.   You may also send us a message via MyChart. We typically respond to MyChart messages within 1-2 business days.  For prescription refills, please ask your pharmacy to contact our office. Our fax number is [phone].  If you have an urgent issue when the clinic is closed that cannot wait until the next business day, you can page your doctor at the number below.    Please note that while we do our best to be available for urgent issues outside of office hours, we are not available 24/7.   If you have an urgent issue and are unable to reach us, you may choose to seek medical care at your doctor's office, retail clinic, urgent care center, or emergency room.  If you have a medical emergency, please immediately call 911 or go to the emergency department.  Pager Numbers  - Dr. Kowalski: [phone]  - Dr. Moye: [phone]  - Dr. Stewart: [phone]  In the event of inclement weather, please call our main line at [phone] for an update on the status of any delays or closures.  Dermatology Medication Tips: Please keep the boxes that topical medications come in in order to help keep track of the instructions about where and how to use these. Pharmacies typically print the medication instructions only on the boxes and not directly on the medication tubes.   If your medication is too expensive, please contact our office at [phone] option 4 or send us a message through MyChart.   We are unable to tell what your co-pay for medications will be in advance as this is different depending on your insurance  coverage. However, we may be able to find a substitute medication at lower cost or fill out paperwork to get insurance to cover a needed medication.   If a prior authorization is required to get your medication covered by your insurance company, please allow us 1-2 business days to complete this process.  Drug prices often vary depending on where the prescription is filled and some pharmacies may offer cheaper prices.  The website www.goodrx.com contains coupons for medications through different pharmacies. The prices here do not account for what the cost may be with help from insurance (it may be cheaper with your insurance), but the website can give you the price if you did not use any insurance.  - You can print the associated coupon and take it with your prescription to the pharmacy.  - You may also stop   by our office during regular business hours and pick up a GoodRx coupon card.  - If you need your prescription sent electronically to a different pharmacy, notify our office through [facility] MyChart or by phone at [phone] option 4.     Si Usted Necesita Algo Despus de Su Visita  Tambin puede enviarnos un mensaje a travs de MyChart. Por lo general respondemos a los mensajes de MyChart en el transcurso de 1 a 2 das hbiles.  Para renovar recetas, por favor pida a su farmacia que se ponga en contacto con nuestra oficina. Nuestro nmero de fax es el [phone].  Si tiene un asunto urgente cuando la clnica est cerrada y que no puede esperar hasta el siguiente da hbil, puede llamar/localizar a su doctor(a) al nmero que aparece a continuacin.   Por favor, tenga en cuenta que aunque hacemos todo lo posible para estar disponibles para asuntos urgentes fuera del horario de oficina, no estamos disponibles las 24 horas del da, los 7 das de la semana.   Si tiene un problema urgente y no puede comunicarse con nosotros, puede optar por buscar atencin mdica  en el consultorio de  su doctor(a), en una clnica privada, en un centro de atencin urgente o en una sala de emergencias.  Si tiene una emergencia mdica, por favor llame inmediatamente al 911 o vaya a la sala de emergencias.  Nmeros de bper  - Dr. Kowalski: [phone]  - Dra. Moye: [phone]  - Dra. Stewart: [phone]  En caso de inclemencias del tiempo, por favor llame a nuestra lnea principal al [phone] para una actualizacin sobre el estado de cualquier retraso o cierre.  Consejos para la medicacin en dermatologa: Por favor, guarde las cajas en las que vienen los medicamentos de uso tpico para ayudarle a seguir las instrucciones sobre dnde y cmo usarlos. Las farmacias generalmente imprimen las instrucciones del medicamento slo en las cajas y no directamente en los tubos del medicamento.   Si su medicamento es muy caro, por favor, pngase en contacto con nuestra oficina llamando al [phone] y presione la opcin 4 o envenos un mensaje a travs de MyChart.   No podemos decirle cul ser su copago por los medicamentos por adelantado ya que esto es diferente dependiendo de la cobertura de su seguro. Sin embargo, es posible que podamos encontrar un medicamento sustituto a menor costo o llenar un formulario para que el seguro cubra el medicamento que se considera necesario.   Si se requiere una autorizacin previa para que su compaa de seguros cubra su medicamento, por favor permtanos de 1 a 2 das hbiles para completar este proceso.  Los precios de los medicamentos varan con frecuencia dependiendo del lugar de dnde se surte la receta y alguna farmacias pueden ofrecer precios ms baratos.  El sitio web www.goodrx.com tiene cupones para medicamentos de diferentes farmacias. Los precios aqu no tienen en cuenta lo que podra costar con la ayuda del seguro (puede ser ms barato con su seguro), pero el sitio web puede darle el precio si no utiliz ningn seguro.  - Puede imprimir el  cupn correspondiente y llevarlo con su receta a la farmacia.  - Tambin puede pasar por nuestra oficina durante el horario de atencin regular y recoger una tarjeta de cupones de GoodRx.  - Si necesita que su receta se enve electrnicamente a una farmacia diferente, informe a nuestra oficina a travs de MyChart de [place] o por telfono llamando al [phone] y presione la opcin 4.    presione la opcin 4.

## 2022-10-23 NOTE — Progress Notes (Signed)
Follow-Up Visit   Subjective  Jerry Welch is a 72 y.o. male who presents for the following: Skin Problem (Spot at right leg that he would like checked. He noticed 4 or 5 months ago. Patient also reports a spot his wife noticed that would like checked. ). The patient has spots, moles and lesions to be evaluated, some may be new or changing and the patient has concerns that these could be cancer.  The following portions of the chart were reviewed this encounter and updated as appropriate:  Tobacco  Allergies  Meds  Problems  Med Hx  Surg Hx  Fam Hx     Review of Systems: No other skin or systemic complaints except as noted in HPI or Assessment and Plan.  Objective  Well appearing patient in no apparent distress; mood and affect are within normal limits.  A focused examination was performed including back, right leg. Relevant physical exam findings are noted in the Assessment and Plan.  left scapula x 1, right pretibia x 2 (3) Erythematous stuck-on, waxy papule or plaque   Assessment & Plan  Inflamed seborrheic keratosis (3) left scapula x 1, right pretibia x 2 Symptomatic, irritating, patient would like treated. Destruction of lesion - left scapula x 1, right pretibia x 2 Complexity: simple   Destruction method: cryotherapy   Informed consent: discussed and consent obtained   Timeout:  patient name, date of birth, surgical site, and procedure verified Lesion destroyed using liquid nitrogen: Yes   Region frozen until ice ball extended beyond lesion: Yes   Outcome: patient tolerated procedure well with no complications   Post-procedure details: wound care instructions given   Additional details:  Prior to procedure, discussed risks of blister formation, small wound, skin dyspigmentation, or rare scar following cryotherapy. Recommend Vaseline ointment to treated areas while healing.  Seborrheic Keratoses - Stuck-on, waxy, tan-brown papules and/or plaques  -  Benign-appearing - Discussed benign etiology and prognosis. - Observe - Call for any changes  Lentigines - Scattered tan macules - Due to sun exposure - Benign-appearing, observe - Recommend daily broad spectrum sunscreen SPF 30+ to sun-exposed areas, reapply every 2 hours as needed. - Call for any changes  Melanocytic Nevi - Tan-brown and/or pink-flesh-colored symmetric macules and papules - Benign appearing on exam today - Observation - Call clinic for new or changing moles - Recommend daily use of broad spectrum spf 30+ sunscreen to sun-exposed areas.   Actinic Damage - chronic, secondary to cumulative UV radiation exposure/sun exposure over time - diffuse scaly erythematous macules with underlying dyspigmentation - Recommend daily broad spectrum sunscreen SPF 30+ to sun-exposed areas, reapply every 2 hours as needed.  - Recommend staying in the shade or wearing long sleeves, sun glasses (UVA+UVB protection) and wide brim hats (4-inch brim around the entire circumference of the hat). - Call for new or changing lesions.  History of PreCancerous Actinic Keratosis  Face and ears - site(s) of PreCancerous Actinic Keratosis clear today. - these may recur and new lesions may form requiring treatment to prevent transformation into skin cancer - observe for new or changing spots and contact [facility] for appointment if occur - photoprotection with sun protective clothing; sunglasses and broad spectrum sunscreen with SPF of at least 30 + and frequent self skin exams recommended - yearly exams by a dermatologist recommended for persons with history of PreCancerous Actinic Keratoses  History of Basal Cell Carcinoma of the Skin Multiple locations see history  - No evidence  of recurrence today - Recommend regular full body skin exams - Recommend daily broad spectrum sunscreen SPF 30+ to sun-exposed areas, reapply every 2 hours as needed.  - Call if any new or changing lesions  are noted between office visits  History of Melanoma in Situ Mid to upper back, left paraspinal 04/2020 - No evidence of recurrence today - Recommend regular full body skin exams - Recommend daily broad spectrum sunscreen SPF 30+ to sun-exposed areas, reapply every 2 hours as needed.  - Call if any new or changing lesions are noted between office visits  History of Dysplastic Nevi Multiple sites see history - No evidence of recurrence today - Recommend regular full body skin exams - Recommend daily broad spectrum sunscreen SPF 30+ to sun-exposed areas, reapply every 2 hours as needed.  - Call if any new or changing lesions are noted between office visits  Return for 6 month tbse hx of melanoma.  IRuthell Rummage, CMA, am acting as scribe for Sarina Ser, MD. Documentation: I have reviewed the above documentation for accuracy and completeness, and I agree with the above.  Sarina Ser, MD

## 2022-10-25 DIAGNOSIS — Z125 Encounter for screening for malignant neoplasm of prostate: Secondary | ICD-10-CM | POA: Diagnosis not present

## 2022-10-25 DIAGNOSIS — E785 Hyperlipidemia, unspecified: Secondary | ICD-10-CM | POA: Diagnosis not present

## 2022-10-25 DIAGNOSIS — C61 Malignant neoplasm of prostate: Secondary | ICD-10-CM | POA: Diagnosis not present

## 2022-10-25 DIAGNOSIS — I10 Essential (primary) hypertension: Secondary | ICD-10-CM | POA: Diagnosis not present

## 2022-10-25 DIAGNOSIS — R42 Dizziness and giddiness: Secondary | ICD-10-CM | POA: Diagnosis not present

## 2022-10-25 DIAGNOSIS — Z0000 Encounter for general adult medical examination without abnormal findings: Secondary | ICD-10-CM | POA: Diagnosis not present

## 2022-10-25 DIAGNOSIS — R413 Other amnesia: Secondary | ICD-10-CM | POA: Diagnosis not present

## 2022-10-25 DIAGNOSIS — Z1211 Encounter for screening for malignant neoplasm of colon: Secondary | ICD-10-CM | POA: Diagnosis not present

## 2022-10-25 DIAGNOSIS — M1120 Other chondrocalcinosis, unspecified site: Secondary | ICD-10-CM | POA: Diagnosis not present

## 2022-10-27 ENCOUNTER — Encounter: Payer: Self-pay | Admitting: Dermatology

## 2022-10-31 DIAGNOSIS — R42 Dizziness and giddiness: Secondary | ICD-10-CM | POA: Diagnosis not present

## 2022-10-31 DIAGNOSIS — R519 Headache, unspecified: Secondary | ICD-10-CM | POA: Diagnosis not present

## 2022-10-31 DIAGNOSIS — G4733 Obstructive sleep apnea (adult) (pediatric): Secondary | ICD-10-CM | POA: Diagnosis not present

## 2022-11-02 DIAGNOSIS — M1120 Other chondrocalcinosis, unspecified site: Secondary | ICD-10-CM | POA: Diagnosis not present

## 2022-11-02 DIAGNOSIS — M25541 Pain in joints of right hand: Secondary | ICD-10-CM | POA: Diagnosis not present

## 2022-11-02 DIAGNOSIS — M25542 Pain in joints of left hand: Secondary | ICD-10-CM | POA: Diagnosis not present

## 2022-11-02 DIAGNOSIS — Z79899 Other long term (current) drug therapy: Secondary | ICD-10-CM | POA: Diagnosis not present

## 2022-11-05 DIAGNOSIS — G4733 Obstructive sleep apnea (adult) (pediatric): Secondary | ICD-10-CM | POA: Diagnosis not present

## 2022-11-09 DIAGNOSIS — M25562 Pain in left knee: Secondary | ICD-10-CM | POA: Diagnosis not present

## 2022-11-09 DIAGNOSIS — Z96653 Presence of artificial knee joint, bilateral: Secondary | ICD-10-CM | POA: Diagnosis not present

## 2022-11-24 ENCOUNTER — Other Ambulatory Visit: Payer: Self-pay | Admitting: *Deleted

## 2022-11-24 DIAGNOSIS — C61 Malignant neoplasm of prostate: Secondary | ICD-10-CM

## 2022-11-27 ENCOUNTER — Other Ambulatory Visit: Payer: PPO

## 2022-11-27 DIAGNOSIS — C61 Malignant neoplasm of prostate: Secondary | ICD-10-CM | POA: Diagnosis not present

## 2022-11-28 LAB — PSA: Prostate Specific Ag, Serum: <0.1 ng/mL (ref 0.0–4.0)

## 2022-11-28 NOTE — Progress Notes (Signed)
06/14/2021 2:52 PM   Jerry Welch 04-10-1951 [id number]  Referring provider: [name], MD [address] [place],  [place] [address]  Urological history: 1. Prostate cancer -PSA (11/2022) <0.1 -robotic prostatectomy with Dr. Erlene Quan in 06/2017.  Surgical pathology consistent with Gleason 3+4.  There was evidence of extraprostatic extension.  Negative margins.  Negative lymph nodes.  Negative seminal vesicles. pT3a N0.  2. High risk hematuria -former smoker -CTU 09/2019 3 mm left renal calculus. No evidence of ureteral calculi or hydronephrosis.  No radiographic evidence of urinary tract neoplasm.  5.6 cm well-circumscribed fluid collection in the [place] of [place], with linear soft tissue density extending to the umbilicus.  Differential diagnosis includes urachal cyst or postop fluid collection.  Colonic diverticulosis. No radiographic evidence of diverticulitis.  Mild hepatic steatosis -Cysto unremarkable with Dr. Erlene Quan in 09/2019 -no reports of gross hematuria -UA (09/2022) negative for micro heme  3. SUI -contributing factors of age, pelvic surgery, sleep apnea, obesity and diuretic -PVR ***  Chief Complaint  Patient presents with   Prostate Cancer    HPI: Jerry Welch is a 72 y.o. male who presents today for follow up.    PVR ***   PMH: Past Medical History:  Diagnosis Date   Back pain    Basal cell carcinoma 05/17/2020   Right top of shoulder. Nodular pattern. [facility]C   Basal cell carcinoma 05/17/2020   Left inferior scapula. Nodular pattern. [facility] 07/27/20   Basal cell carcinoma 02/17/2021   L neck post auricular - ED&C    Dysplastic nevus 05/17/2020   Left mid to upper back 6cm lat to spine. Mild atypia, limited margins free.    Dysplastic nevus 08/10/2020   left pubic area, mild atypia, limited margins free   GERD (gastroesophageal reflux disease)    History of hiatal hernia    HLD (hyperlipidemia) 04/30/2015   Hypertension     Melanoma ([place]) 05/17/2020   Mid to upper back, left paraspinal. MMIS, lateral margin involved. exc 07/20/20, margins free.   Osteoarthrosis involving multiple sites    Prostate cancer ([place]) 2019   prostatectomy   Ruptured lumbar disc    L4 & L5   Sleep apnea     Surgical History: Past Surgical History:  Procedure Laterality Date   APPENDECTOMY     BACK SURGERY  09/2013   L4-L5    BASAL CELL CARCINOMA EXCISION  2012   right neck    PELVIC LYMPH NODE DISSECTION Bilateral 07/09/2017   Procedure: PELVIC LYMPH NODE DISSECTION;  Surgeon: Hollice Espy, MD;  Location: [facility] ORS;  Service: Urology;  Laterality: Bilateral;   PROSTATE BIOPSY     REPLACEMENT TOTAL KNEE  2002   left knee    REPLACEMENT TOTAL KNEE     right x 2   ROBOT ASSISTED LAPAROSCOPIC RADICAL PROSTATECTOMY N/A 07/09/2017   Procedure: ROBOTIC ASSISTED LAPAROSCOPIC RADICAL PROSTATECTOMY;  Surgeon: Hollice Espy, MD;  Location: [facility] ORS;  Service: Urology;  Laterality: N/A;    Home Medications:  Allergies as of 06/14/2021       Reactions   Amlodipine Other (See Comments)   LE EDEMA   Lisinopril    Cough-onset        Medication List        Accurate as of June 14, 2021 11:59 PM. If you have any questions, ask your nurse or doctor.          STOP taking these medications    mupirocin  ointment 2 % Commonly known as: BACTROBAN Stopped by: Iyania Denne, PA-C   valsartan 320 MG tablet Commonly known as: DIOVAN Stopped by: Maryfer Tauzin, PA-C       TAKE these medications    cyanocobalamin 1000 MCG/ML injection Commonly known as: (VITAMIN B-12) Inject 1,000 mcg into the muscle every 14 (fourteen) days.   doxazosin 2 MG tablet Commonly known as: Cardura Take 1 tablet (2 mg total) by mouth 2 (two) times daily.   hydrochlorothiazide 25 MG tablet Commonly known as: HYDRODIURIL TAKE 1 TABLET BY MOUTH EVERY OTHER DAY   hydroxychloroquine 200 MG tablet Commonly known as:  PLAQUENIL Take 1 tablet by mouth 2 (two) times daily.   latanoprost 0.005 % ophthalmic solution Commonly known as: XALATAN Apply to eye.   naproxen sodium 220 MG tablet Commonly known as: ALEVE Take 440 mg by mouth daily.   omeprazole 40 MG capsule Commonly known as: PRILOSEC Take 40 mg by mouth daily.   Pfizer-BioNTech COVID-19 Vacc 30 MCG/0.3ML injection Generic drug: COVID-19 mRNA vaccine (Pfizer) USE AS DIRECTED   potassium chloride 10 MEQ tablet Commonly known as: KLOR-CON Take 10 mEq by mouth 3 (three) times daily.   valsartan-hydrochlorothiazide 320-25 MG tablet Commonly known as: DIOVAN-HCT Take 1 tablet by mouth daily.   verapamil 240 MG (CO) 24 hr tablet Commonly known as: COVERA HS Take 240 mg by mouth 2 (two) times daily.        Allergies:  Allergies  Allergen Reactions   Amlodipine Other (See Comments)    LE EDEMA   Lisinopril     Cough-onset     Family History: Family History  Problem Relation Age of Onset   Heart failure Mother    Heart attack Maternal Grandfather    Bladder Cancer Neg Hx    Prostate cancer Neg Hx    Kidney cancer Neg Hx     Social History:  reports that he quit smoking about 40 years ago. His smoking use included cigarettes. He smoked an average of 1 pack per day. He has quit using smokeless tobacco. He reports current alcohol use of about 1.0 standard drink per week. He reports that he does not use drugs.  ROS: For pertinent review of systems please refer to history of present illness  Physical Exam: BP (!) 155/84 (BP Location: Left Arm, Patient Position: Sitting, Cuff Size: Normal)   Pulse 72   Ht '5\' 8"'$  (1.727 m)   Wt 204 lb (92.5 kg)   BMI 31.02 kg/m   Constitutional:  Well nourished. Alert and oriented, No acute distress. HEENT: [place] AT, moist mucus membranes.  Trachea midline Cardiovascular: No clubbing, cyanosis, or edema. Respiratory: Normal respiratory effort, no increased work of breathing. Neurologic:  Grossly intact, no focal deficits, moving all 4 extremities. Psychiatric: Normal mood and affect.   Laboratory Data: Component     Latest Ref Rng 11/27/2022  Prostate Specific Ag, Serum     0.0 - 4.0 ng/mL <0.1    Serum creatinine (09/2022) 1.3 PSA (09/2022) <0.01 Total cholesterol (09/2022) 120  Urinalysis Urinalysis w/Microscopic Order: [id number]  Ref Range & Units 1 mo ago  Color Colorless, Straw, Light Yellow, Yellow, Dark Yellow Colorless  Clarity Clear Clear  Specific Gravity 1.005 - 1.030 1.010  pH, Urine 5.0 - 8.0 6.5  Protein, Urinalysis Negative mg/dL Negative  Glucose, Urinalysis Negative mg/dL Negative  Ketones, Urinalysis Negative mg/dL Negative  Blood, Urinalysis Negative Negative  Nitrite, Urinalysis Negative Negative  Leukocyte Esterase, Urinalysis Negative Negative  Bilirubin,  Urinalysis Negative Negative  Urobilinogen, Urinalysis 0.2 - 1.0 mg/dL 0.2  WBC, UA <=5 /hpf <1  Red Blood Cells, Urinalysis <=3 /hpf <1  Bacteria, Urinalysis 0 - 5 /hpf 0-5  Squamous Epithelial Cells, Urinalysis /hpf 0  Resulting Agency  [facility]   Specimen Collected: 10/18/22 08:33   Performed by: [place]: 10/18/22 13:37  Received From: [facility]  Result Received: 10/22/22 14:27  I have reviewed the labs.   Pertinent Imaging: N/A   Assessment & Plan:    1. Prostate cancer ([place]) -PSA undetectable  2. Stress incontinence, male -minimal bother  3. High risk hematuria -work up in 2021 -NED -no reports of gross hematuria -UA negative for micro heme    4. Night sweats/fatigue -explained that we could check a testosterone level, but it would be an academic exercise as testosterone therapy is contraindicated in men with a history of prostate cancer -I did offer further discussion regarding this if he is finding his night sweats/fatigue are affecting his life in a significantly negative way we could refer off  to a tertiary clinic, but he deferred at this time as he feels his symptoms are not severe enough to risk testosterone therapy and cause a recurrence of his prostate cancer  Return in about 6 months (around 12/12/2021) for f/u w/ PSA prior .  Zara Council, PA-C  [facility] [address] [place],  [address] [phone]

## 2022-11-29 ENCOUNTER — Ambulatory Visit: Payer: PPO | Admitting: Urology

## 2022-11-29 ENCOUNTER — Encounter: Payer: Self-pay | Admitting: Urology

## 2022-11-29 VITALS — BP 154/86 | HR 73 | Ht 69.0 in | Wt 203.0 lb

## 2022-11-29 DIAGNOSIS — N393 Stress incontinence (female) (male): Secondary | ICD-10-CM | POA: Diagnosis not present

## 2022-11-29 DIAGNOSIS — R7989 Other specified abnormal findings of blood chemistry: Secondary | ICD-10-CM | POA: Diagnosis not present

## 2022-11-29 DIAGNOSIS — C61 Malignant neoplasm of prostate: Secondary | ICD-10-CM

## 2022-11-29 DIAGNOSIS — R61 Generalized hyperhidrosis: Secondary | ICD-10-CM

## 2022-11-29 DIAGNOSIS — R5383 Other fatigue: Secondary | ICD-10-CM

## 2022-11-29 DIAGNOSIS — G4733 Obstructive sleep apnea (adult) (pediatric): Secondary | ICD-10-CM | POA: Diagnosis not present

## 2022-11-29 DIAGNOSIS — R319 Hematuria, unspecified: Secondary | ICD-10-CM

## 2022-11-29 LAB — BLADDER SCAN AMB NON-IMAGING: Scan Result: 0

## 2022-11-30 ENCOUNTER — Telehealth: Payer: Self-pay | Admitting: Family Medicine

## 2022-11-30 LAB — TESTOSTERONE: Testosterone: 343 ng/dL (ref 264–916)

## 2022-11-30 NOTE — Telephone Encounter (Signed)
Patient notified and voiced understanding, appointments made.

## 2022-11-30 NOTE — Telephone Encounter (Signed)
-----   Message from Nori Riis, PA-C sent at 11/30/2022  9:18 AM EST ----- Please let Mr. Alongi know that his testosterone level is normal.   We need to see him back in one year with PSA prior.

## 2022-12-26 DIAGNOSIS — K219 Gastro-esophageal reflux disease without esophagitis: Secondary | ICD-10-CM | POA: Diagnosis not present

## 2022-12-26 DIAGNOSIS — Z86010 Personal history of colonic polyps: Secondary | ICD-10-CM | POA: Diagnosis not present

## 2023-01-05 DIAGNOSIS — M1120 Other chondrocalcinosis, unspecified site: Secondary | ICD-10-CM | POA: Diagnosis not present

## 2023-01-05 DIAGNOSIS — R21 Rash and other nonspecific skin eruption: Secondary | ICD-10-CM | POA: Diagnosis not present

## 2023-01-05 DIAGNOSIS — M1990 Unspecified osteoarthritis, unspecified site: Secondary | ICD-10-CM | POA: Diagnosis not present

## 2023-01-05 DIAGNOSIS — Z7960 Long term (current) use of unspecified immunomodulators and immunosuppressants: Secondary | ICD-10-CM | POA: Diagnosis not present

## 2023-01-11 ENCOUNTER — Ambulatory Visit: Payer: PPO

## 2023-01-11 DIAGNOSIS — D122 Benign neoplasm of ascending colon: Secondary | ICD-10-CM | POA: Diagnosis not present

## 2023-01-11 DIAGNOSIS — K2970 Gastritis, unspecified, without bleeding: Secondary | ICD-10-CM | POA: Diagnosis not present

## 2023-01-11 DIAGNOSIS — K5710 Diverticulosis of small intestine without perforation or abscess without bleeding: Secondary | ICD-10-CM | POA: Diagnosis not present

## 2023-01-11 DIAGNOSIS — K640 First degree hemorrhoids: Secondary | ICD-10-CM | POA: Diagnosis not present

## 2023-01-11 DIAGNOSIS — K635 Polyp of colon: Secondary | ICD-10-CM | POA: Diagnosis not present

## 2023-01-11 DIAGNOSIS — K5730 Diverticulosis of large intestine without perforation or abscess without bleeding: Secondary | ICD-10-CM | POA: Diagnosis not present

## 2023-01-11 DIAGNOSIS — K219 Gastro-esophageal reflux disease without esophagitis: Secondary | ICD-10-CM | POA: Diagnosis not present

## 2023-01-11 DIAGNOSIS — Z86010 Personal history of colonic polyps: Secondary | ICD-10-CM | POA: Diagnosis not present

## 2023-01-11 DIAGNOSIS — K2960 Other gastritis without bleeding: Secondary | ICD-10-CM | POA: Diagnosis not present

## 2023-01-11 DIAGNOSIS — D123 Benign neoplasm of transverse colon: Secondary | ICD-10-CM | POA: Diagnosis not present

## 2023-01-11 DIAGNOSIS — D120 Benign neoplasm of cecum: Secondary | ICD-10-CM | POA: Diagnosis not present

## 2023-01-11 DIAGNOSIS — K5711 Diverticulosis of small intestine without perforation or abscess with bleeding: Secondary | ICD-10-CM | POA: Diagnosis not present

## 2023-02-03 DIAGNOSIS — G4733 Obstructive sleep apnea (adult) (pediatric): Secondary | ICD-10-CM | POA: Diagnosis not present

## 2023-02-06 ENCOUNTER — Ambulatory Visit: Payer: PPO | Admitting: Cardiovascular Disease

## 2023-03-22 ENCOUNTER — Ambulatory Visit: Payer: PPO | Admitting: Dermatology

## 2023-04-01 NOTE — Progress Notes (Unsigned)
Cardiology Office Note  Date:  04/02/2023   ID:  Jerry Welch, DOB Feb 01, 1951, MRN [id number]  PCP:  Jerry Mina, MD   Chief Complaint  Patient presents with   12 month follow up     "Doing well." Medications reviewed by the patient verbally.     HPI:  Mr. Jerry Welch  is a pleasant 72 year old gentleman with history of obesity,  hypertension,  migraines  osteoarthritis  Smoked for 20 yr, until 1982  prostate cancer , resection 06/2017 who presents for follow up of his hypertension,  and shortness of breath.  LOV 11/2021  Active at baseline Recent travel to [place], plays golf, rebuild scars  Chronic leg swelling, not bothered by it Does not like to wear compression hose  Reviewed blood pressure medication Tolerating Cardura 2 mg twice daily, valsartan HCTZ 320/25 daily, verapamil to 40 twice daily  Lab work reviewed Total cholesterol 120 LDL 47 Creatinine 1.3 Hemoglobin 16  EKG personally reviewed by myself on todays visit EKG Interpretation Date/Time:  Monday April 02 2023 09:36:53 EDT Ventricular Rate:  61 PR Interval:  188 QRS Duration:  100 QT Interval:  462 QTC Calculation: 465 R Axis:   -16  Text Interpretation: Normal sinus rhythm Normal ECG When compared with ECG of 01-Jul-2021 18:05, PREVIOUS ECG IS PRESENT Confirmed by Jerry Welch [phone]) on 04/02/2023 9:57:18 AM    Prior imaging reviewed MRI of the brain shows no acute intracranial abnormalities.  Moderate chronic microvascular ischemic disease with a few small remote lacunar infarcts at the left pons.  Followed by neuro allergy, Dr. Clelia Welch had additional scans, no carotid disease   Other past medical history History of severe back pain.  trouble started 30 years ago with a bulge disc.    ultrasound September 2013 showing hepatic steatohepatitis,      PMH:   has a past medical history of Back pain, Basal cell carcinoma (05/17/2020), Basal cell carcinoma (05/17/2020), Basal cell carcinoma  (02/17/2021), Dysplastic nevus (05/17/2020), Dysplastic nevus (08/10/2020), GERD (gastroesophageal reflux disease), History of hiatal hernia, HLD (hyperlipidemia) (04/30/2015), Hypertension, Melanoma (HCC) (05/17/2020), Osteoarthrosis involving multiple sites, Prostate cancer (HCC) (2019), Ruptured lumbar disc, and Sleep apnea.  PSH:    Past Surgical History:  Procedure Laterality Date   APPENDECTOMY     BACK SURGERY  09/2013   L4-L5    BASAL CELL CARCINOMA EXCISION  2012   right neck    PELVIC LYMPH NODE DISSECTION Bilateral 07/09/2017   Procedure: PELVIC LYMPH NODE DISSECTION;  Surgeon: Vanna Scotland, MD;  Location: [facility];  Service: Urology;  Laterality: Bilateral;   PROSTATE BIOPSY     REPLACEMENT TOTAL KNEE  2002   left knee    REPLACEMENT TOTAL KNEE     right x 2   ROBOT ASSISTED LAPAROSCOPIC RADICAL PROSTATECTOMY N/A 07/09/2017   Procedure: ROBOTIC ASSISTED LAPAROSCOPIC RADICAL PROSTATECTOMY;  Surgeon: Vanna Scotland, MD;  Location: [facility] OR[facility];  Service: Urology;  Laterality: N/A;    Current Outpatient Medications  Medication Sig Dispense Refill   aspirin 81 MG EC tablet Take 81 mg by mouth daily.     atorvastatin (LIPITOR) 40 MG tablet Take 40 mg by mouth daily.     cyanocobalamin (,VITAMIN B-12,) 1000 MCG/ML injection Inject 1,000 mcg into the muscle every 14 (fourteen) days.      diclofenac Sodium (VOLTAREN) 1 % GEL Apply 2 g topically 4 (four) times daily.     doxazosin (CARDURA) 2 MG tablet Take 1 tablet (2 mg total) by mouth  2 (two) times daily. 90 tablet 3   fluticasone (FLONASE) 50 MCG/ACT nasal spray Place 1 spray into both nostrils 2 (two) times daily.     hydroxychloroquine (PLAQUENIL) 200 MG tablet Take 1 tablet by mouth 2 (two) times daily.     latanoprost (XALATAN) 0.005 % ophthalmic solution Place 1 drop into both eyes at bedtime.     loratadine (CLARITIN) 10 MG tablet Take 10 mg by mouth daily.     magnesium oxide (MAG-OX) 400 MG tablet Take 400 mg by mouth  daily.     meclizine (ANTIVERT) 25 MG tablet Take 1 tablet (25 mg total) by mouth 2 (two) times daily as needed for dizziness. 14 tablet 0   naproxen sodium (ANAPROX) 220 MG tablet Take 440 mg by mouth daily as needed (pain).     omeprazole (PRILOSEC) 40 MG capsule Take 40 mg by mouth daily.      potassium chloride (KLOR-CON) 10 MEQ tablet Take 10 mEq by mouth 3 (three) times daily.     valsartan-hydrochlorothiazide (DIOVAN-HCT) 320-25 MG tablet Take 1 tablet by mouth daily.     verapamil (CALAN-SR) 240 MG CR tablet Take 240 mg by mouth 2 (two) times daily.     No current facility-administered medications for this visit.     Allergies:   Amlodipine and Lisinopril   Social History:  The patient  reports that he quit smoking about 41 years ago. His smoking use included cigarettes. He smoked an average of 1 pack per day. He has quit using smokeless tobacco. He reports current alcohol use of about 1.0 standard drink of alcohol per week. He reports that he does not use drugs.   Family History:   family history includes Heart attack in his maternal grandfather; Heart failure in his mother.    Review of Systems: Review of Systems  Constitutional: Negative.   HENT: Negative.    Respiratory: Negative.    Cardiovascular: Negative.   Gastrointestinal: Negative.   Musculoskeletal: Negative.   Neurological: Negative.   Psychiatric/Behavioral: Negative.    All other systems reviewed and are negative.   PHYSICAL EXAM: VS:  BP 130/80 (BP Location: Left Arm, Patient Position: Sitting, Cuff Size: Normal)   Pulse 61   Ht 5\' 9"  (1.753 m)   Wt 206 lb (93.4 kg)   SpO2 97%   BMI 30.42 kg/m  , BMI Body mass index is 30.42 kg/m. Constitutional:  oriented to person, place, and time. No distress.  HENT:  Head: Grossly normal Eyes:  no discharge. No scleral icterus.  Neck: No JVD, no carotid bruits  Cardiovascular: Regular rate and rhythm, no murmurs appreciated Pulmonary/Chest: Clear to  auscultation bilaterally, no wheezes or rails Abdominal: Soft.  no distension.  no tenderness.  Musculoskeletal: Normal range of motion Neurological:  normal muscle tone. Coordination normal. No atrophy Skin: Skin warm and dry Psychiatric: normal affect, pleasant  Recent Labs: No results found for requested labs within last 365 days.    Lipid Panel No results found for: "CHOL", "HDL", "LDLCALC", "TRIG"    Wt Readings from Last 3 Encounters:  04/02/23 206 lb (93.4 kg)  11/29/22 203 lb (92.1 kg)  10/16/22 202 lb (91.6 kg)     ASSESSMENT AND PLAN:  Essential hypertension Blood pressure is well controlled on today's visit. No changes made to the medications. 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 04/02/2023  [facility] [place], [place] [phone]

## 2023-04-02 ENCOUNTER — Encounter: Payer: Self-pay | Admitting: Cardiovascular Disease

## 2023-04-02 ENCOUNTER — Ambulatory Visit: Payer: PPO | Attending: Cardiovascular Disease | Admitting: Cardiovascular Disease

## 2023-04-02 VITALS — BP 130/80 | HR 61 | Ht 69.0 in | Wt 206.0 lb

## 2023-04-02 DIAGNOSIS — R0602 Shortness of breath: Secondary | ICD-10-CM | POA: Diagnosis not present

## 2023-04-02 DIAGNOSIS — G4733 Obstructive sleep apnea (adult) (pediatric): Secondary | ICD-10-CM | POA: Diagnosis not present

## 2023-04-02 DIAGNOSIS — E782 Mixed hyperlipidemia: Secondary | ICD-10-CM | POA: Diagnosis not present

## 2023-04-02 DIAGNOSIS — Z7189 Other specified counseling: Secondary | ICD-10-CM | POA: Diagnosis not present

## 2023-04-02 DIAGNOSIS — I10 Essential (primary) hypertension: Secondary | ICD-10-CM | POA: Diagnosis not present

## 2023-04-02 DIAGNOSIS — M7989 Other specified soft tissue disorders: Secondary | ICD-10-CM

## 2023-04-02 MED ORDER — DOXAZOSIN MESYLATE 2 MG PO TABS: 2.0000 mg | ORAL_TABLET | Freq: Two times a day (BID) | ORAL | 3 refills | Status: AC

## 2023-04-02 NOTE — Patient Instructions (Signed)
Medication Instructions:  No changes  If you need a refill on your cardiac medications before your next appointment, please call your pharmacy.   Lab work: No new labs needed  Testing/Procedures: No new testing needed  Follow-Up: At [facility], you and your health needs are our priority.  As part of our continuing mission to provide you with exceptional heart care, we have created designated Provider Care Teams.  These Care Teams include your primary Cardiologist (physician) and Advanced Practice Providers (APPs -  Physician Assistants and Nurse Practitioners) who all work together to provide you with the care you need, when you need it.  You will need a follow up appointment in 12 months  Providers on your designated Care Team:   Christopher Berge, NP Ryan Dunn, PA-C Cadence Furth, PA-C  COVID-19 Vaccine Information can be found at: [email or web address] For questions related to vaccine distribution or appointments, please email [email or web address] or call [phone].   

## 2023-04-06 DIAGNOSIS — M1120 Other chondrocalcinosis, unspecified site: Secondary | ICD-10-CM | POA: Diagnosis not present

## 2023-04-06 DIAGNOSIS — Z7960 Long term (current) use of unspecified immunomodulators and immunosuppressants: Secondary | ICD-10-CM | POA: Diagnosis not present

## 2023-05-06 DIAGNOSIS — G4733 Obstructive sleep apnea (adult) (pediatric): Secondary | ICD-10-CM | POA: Diagnosis not present

## 2023-05-08 ENCOUNTER — Ambulatory Visit: Payer: PPO | Admitting: Dermatology

## 2023-05-08 ENCOUNTER — Encounter: Payer: Self-pay | Admitting: Dermatology

## 2023-05-08 VITALS — BP 156/84 | HR 64

## 2023-05-08 DIAGNOSIS — Z86018 Personal history of other benign neoplasm: Secondary | ICD-10-CM | POA: Diagnosis not present

## 2023-05-08 DIAGNOSIS — W908XXA Exposure to other nonionizing radiation, initial encounter: Secondary | ICD-10-CM

## 2023-05-08 DIAGNOSIS — Z1283 Encounter for screening for malignant neoplasm of skin: Secondary | ICD-10-CM

## 2023-05-08 DIAGNOSIS — L820 Inflamed seborrheic keratosis: Secondary | ICD-10-CM

## 2023-05-08 DIAGNOSIS — L578 Other skin changes due to chronic exposure to nonionizing radiation: Secondary | ICD-10-CM | POA: Diagnosis not present

## 2023-05-08 DIAGNOSIS — L821 Other seborrheic keratosis: Secondary | ICD-10-CM

## 2023-05-08 DIAGNOSIS — D692 Other nonthrombocytopenic purpura: Secondary | ICD-10-CM

## 2023-05-08 DIAGNOSIS — I872 Venous insufficiency (chronic) (peripheral): Secondary | ICD-10-CM

## 2023-05-08 DIAGNOSIS — Z86006 Personal history of melanoma in-situ: Secondary | ICD-10-CM | POA: Diagnosis not present

## 2023-05-08 DIAGNOSIS — D1801 Hemangioma of skin and subcutaneous tissue: Secondary | ICD-10-CM | POA: Diagnosis not present

## 2023-05-08 DIAGNOSIS — L918 Other hypertrophic disorders of the skin: Secondary | ICD-10-CM | POA: Diagnosis not present

## 2023-05-08 DIAGNOSIS — L814 Other melanin hyperpigmentation: Secondary | ICD-10-CM

## 2023-05-08 DIAGNOSIS — Z85820 Personal history of malignant melanoma of skin: Secondary | ICD-10-CM

## 2023-05-08 DIAGNOSIS — Z85828 Personal history of other malignant neoplasm of skin: Secondary | ICD-10-CM

## 2023-05-08 NOTE — Patient Instructions (Addendum)

## 2023-05-08 NOTE — Progress Notes (Signed)
Follow-Up Visit   Subjective  Jerry Welch is a 72 y.o. male who presents for the following: Skin Cancer Screening and Full Body Skin Exam. HxMIS. HxBCC, Hx of multiple dysplastic nevi.  The patient presents for Total-Body Skin Exam (TBSE) for skin cancer screening and mole check. The patient has spots, moles and lesions to be evaluated, some may be new or changing and the patient may have concern these could be cancer.    The following portions of the chart were reviewed this encounter and updated as appropriate: medications, allergies, medical history  Review of Systems:  No other skin or systemic complaints except as noted in HPI or Assessment and Plan.  Objective  Well appearing patient in no apparent distress; mood and affect are within normal limits.  A full examination was performed including scalp, head, eyes, ears, nose, lips, neck, chest, axillae, abdomen, back, buttocks, bilateral upper extremities, bilateral lower extremities, hands, feet, fingers, toes, fingernails, and toenails. All findings within normal limits unless otherwise noted below.   Relevant physical exam findings are noted in the Assessment and Plan.  Back x7, R forearm x2 (9) Erythematous keratotic or waxy stuck-on papule or plaque.    Assessment & Plan   History of Basal Cell Carcinoma of the Skin Multiple locations see history  - No evidence of recurrence today - Recommend regular full body skin exams - Recommend daily broad spectrum sunscreen SPF 30+ to sun-exposed areas, reapply every 2 hours as needed.  - Call if any new or changing lesions are noted between office visits   History of Melanoma in Situ Mid to upper back, left paraspinal 04/2020 - No evidence of recurrence today - No lymphadenopathy - Recommend regular full body skin exams - Recommend daily broad spectrum sunscreen SPF 30+ to sun-exposed areas, reapply every 2 hours as needed.  - Call if any new or changing lesions are  noted between office visits   History of Dysplastic Nevi Multiple sites see history - No evidence of recurrence today - Recommend regular full body skin exams - Recommend daily broad spectrum sunscreen SPF 30+ to sun-exposed areas, reapply every 2 hours as needed.  - Call if any new or changing lesions are noted between office visits  SKIN CANCER SCREENING PERFORMED TODAY.  ACTINIC DAMAGE - Chronic condition, secondary to cumulative UV/sun exposure - diffuse scaly erythematous macules with underlying dyspigmentation - Recommend daily broad spectrum sunscreen SPF 30+ to sun-exposed areas, reapply every 2 hours as needed.  - Staying in the shade or wearing long sleeves, sun glasses (UVA+UVB protection) and wide brim hats (4-inch brim around the entire circumference of the hat) are also recommended for sun protection.  - Call for new or changing lesions.  LENTIGINES, SEBORRHEIC KERATOSES, HEMANGIOMAS - Benign normal skin lesions - Benign-appearing - Call for any changes  MELANOCYTIC NEVI - Tan-brown and/or pink-flesh-colored symmetric macules and papules - Benign appearing on exam today - Observation - Call clinic for new or changing moles - Recommend daily use of broad spectrum spf 30+ sunscreen to sun-exposed areas.    Purpura - Chronic; persistent and recurrent.  Treatable, but not curable. - Violaceous macules and patches at arms - Benign - Related to trauma, age, sun damage and/or use of blood thinners, chronic use of topical and/or oral steroids - Observe - Can use OTC arnica containing moisturizer such as Dermend Bruise Formula if desired - Call for worsening or other concerns  Acrochordons (Skin Tags) - Fleshy, skin-colored pedunculated papules at  axillae  - Benign appearing.  - Observe. - If desired, they can be removed with an in office procedure that is not covered by insurance. - Please call the clinic if you notice any new or changing lesions.   STASIS  DERMATITIS Exam: Erythematous, scaly patches involving the ankle and distal lower leg with associated lower leg edema.  Chronic and persistent condition with duration or expected duration over one year. Condition is symptomatic/ bothersome to patient. Not currently at goal.   Stasis in the legs causes chronic leg swelling, which may result in itchy or painful rashes, skin discoloration, skin texture changes, and sometimes ulceration.  Recommend daily graduated compression hose/stockings- easiest to put on first thing in morning, remove at bedtime.  Elevate legs as much as possible. Avoid salt/sodium rich foods.  Treatment Plan: Recommend daily graduated compression hose/stockings- easiest to put on first thing in morning, remove at bedtime.  Elevate legs as much as possible. Avoid salt/sodium rich foods.    Inflamed seborrheic keratosis (9) Back x7, R forearm x2  Symptomatic, irritating, patient would like treated.  Destruction of lesion - Back x7, R forearm x2 (9) Complexity: simple   Destruction method: cryotherapy   Informed consent: discussed and consent obtained   Timeout:  patient name, date of birth, surgical site, and procedure verified Lesion destroyed using liquid nitrogen: Yes   Region frozen until ice ball extended beyond lesion: Yes   Outcome: patient tolerated procedure well with no complications   Post-procedure details: wound care instructions given   Additional details:  Prior to procedure, discussed risks of blister formation, small wound, skin dyspigmentation, or rare scar following cryotherapy. Recommend Vaseline ointment to treated areas while healing.    Return in about 6 months (around 11/08/2023) for TBSE, HxMIS, HxBCC, HxDN.  I, Lawson Radar, CMA, am acting as scribe for Armida Sans, MD.   Documentation: I have reviewed the above documentation for accuracy and completeness, and I agree with the above.  Armida Sans, MD

## 2023-05-11 DIAGNOSIS — M1120 Other chondrocalcinosis, unspecified site: Secondary | ICD-10-CM | POA: Diagnosis not present

## 2023-05-11 DIAGNOSIS — M25542 Pain in joints of left hand: Secondary | ICD-10-CM | POA: Diagnosis not present

## 2023-05-11 DIAGNOSIS — Z7960 Long term (current) use of unspecified immunomodulators and immunosuppressants: Secondary | ICD-10-CM | POA: Diagnosis not present

## 2023-05-18 ENCOUNTER — Encounter: Payer: Self-pay | Admitting: Dermatology

## 2023-06-12 DIAGNOSIS — J0140 Acute pansinusitis, unspecified: Secondary | ICD-10-CM | POA: Diagnosis not present

## 2023-08-06 DIAGNOSIS — G4733 Obstructive sleep apnea (adult) (pediatric): Secondary | ICD-10-CM | POA: Diagnosis not present

## 2023-08-10 DIAGNOSIS — M150 Primary generalized (osteo)arthritis: Secondary | ICD-10-CM | POA: Diagnosis not present

## 2023-08-10 DIAGNOSIS — M1120 Other chondrocalcinosis, unspecified site: Secondary | ICD-10-CM | POA: Diagnosis not present

## 2023-08-10 DIAGNOSIS — Z7960 Long term (current) use of unspecified immunomodulators and immunosuppressants: Secondary | ICD-10-CM | POA: Diagnosis not present

## 2023-10-05 NOTE — Progress Notes (Signed)
 [facility] [address] [place], [place] [address]  Pulmonary Sleep Medicine   Office Visit Note  Patient Name: Jerry Welch DOB: 12-18-1950 MRN [id number]    Chief Complaint: Obstructive Sleep Apnea visit  Brief History:  Jerry Welch is seen today for  an annual follow up visit for APAP@ 10-20 cmH2O. The patient has a 9 year history of sleep apnea. Patient is using PAP nightly.  The patient feels rested after sleeping with PAP.  The patient reports benefiting from PAP use. Reported sleepiness is improved and the Epworth Sleepiness Score is 13 out of 24. The patient will occasionally take naps. The patient complains of the following: none.  The compliance download shows 98% compliance with an average use time of 7 hours 41 minutes. The AHI is 6.8.  The patient does not complain of limb movements disrupting sleep. The patient continues to require PAP therapy in order to eliminate sleep apnea.   ROS  General: (-) fever, (-) chills, (-) night sweat Nose and Sinuses: (-) nasal stuffiness or itchiness, (-) postnasal drip, (-) nosebleeds, (-) sinus trouble. Mouth and Throat: (-) sore throat, (-) hoarseness. Neck: (-) swollen glands, (-) enlarged thyroid, (-) neck pain. Respiratory: - cough, - shortness of breath, - wheezing. Neurologic: - numbness, - tingling. Psychiatric: - anxiety, - depression   Current Medication: Outpatient Encounter Medications as of 10/08/2023  Medication Sig   atorvastatin (LIPITOR) 40 MG tablet Take 40 mg by mouth daily.   cyanocobalamin (,VITAMIN B-12,) 1000 MCG/ML injection Inject 1,000 mcg into the muscle every 14 (fourteen) days.    diclofenac  Sodium (VOLTAREN ) 1 % GEL Apply 2 g topically 4 (four) times daily.   doxazosin  (CARDURA ) 2 MG tablet Take 1 tablet (2 mg total) by mouth 2 (two) times daily.   fluticasone (FLONASE) 50 MCG/ACT nasal spray Place 1 spray into both nostrils 2 (two) times daily.   hydroxychloroquine (PLAQUENIL) 200 MG  tablet Take 1 tablet by mouth 2 (two) times daily.   latanoprost (XALATAN) 0.005 % ophthalmic solution Place 1 drop into both eyes at bedtime.   loratadine (CLARITIN) 10 MG tablet Take 10 mg by mouth daily.   meclizine  (ANTIVERT ) 25 MG tablet Take 1 tablet (25 mg total) by mouth 2 (two) times daily as needed for dizziness.   methotrexate (RHEUMATREX) 2.5 MG tablet Take 15 mg by mouth once a week.   naproxen  sodium (ANAPROX ) 220 MG tablet Take 440 mg by mouth daily as needed (pain).   potassium chloride  (KLOR-CON ) 10 MEQ tablet Take 10 mEq by mouth 3 (three) times daily.   valsartan -hydrochlorothiazide  (DIOVAN -HCT) 320-25 MG tablet Take 1 tablet by mouth daily.   verapamil  (CALAN -SR) 240 MG CR tablet Take 240 mg by mouth 2 (two) times daily.   [DISCONTINUED] aspirin 81 MG EC tablet Take 81 mg by mouth daily.   [DISCONTINUED] magnesium oxide (MAG-OX) 400 MG tablet Take 400 mg by mouth daily.   [DISCONTINUED] omeprazole (PRILOSEC) 40 MG capsule Take 40 mg by mouth daily.    No facility-administered encounter medications on file as of 10/08/2023.    Surgical History: Past Surgical History:  Procedure Laterality Date   APPENDECTOMY     BACK SURGERY  09/2013   L4-L5    BASAL CELL CARCINOMA EXCISION  2012   right neck    PELVIC LYMPH NODE DISSECTION Bilateral 07/09/2017   Procedure: PELVIC LYMPH NODE DISSECTION;  Surgeon: Penne Knee, MD;  Location: [facility] ORS;  Service: Urology;  Laterality: Bilateral;   PROSTATE  BIOPSY     REPLACEMENT TOTAL KNEE  2002   left knee    REPLACEMENT TOTAL KNEE     right x 2   ROBOT ASSISTED LAPAROSCOPIC RADICAL PROSTATECTOMY N/A 07/09/2017   Procedure: ROBOTIC ASSISTED LAPAROSCOPIC RADICAL PROSTATECTOMY;  Surgeon: Penne Knee, MD;  Location: [facility] ORS;  Service: Urology;  Laterality: N/A;    Medical History: Past Medical History:  Diagnosis Date   Back pain    Basal cell carcinoma 05/17/2020   Right top of shoulder. Nodular pattern. [facility]   Basal cell  carcinoma 05/17/2020   Left inferior scapula. Nodular pattern. [facility] 07/27/20   Basal cell carcinoma 02/17/2021   L neck post auricular - ED&C    Dysplastic nevus 05/17/2020   Left mid to upper back 6cm lat to spine. Mild atypia, limited margins free.    Dysplastic nevus 08/10/2020   left pubic area, mild atypia, limited margins free   GERD (gastroesophageal reflux disease)    History of hiatal hernia    HLD (hyperlipidemia) 04/30/2015   Hypertension    Melanoma (HCC) 05/17/2020   Mid to upper back, left paraspinal. MMIS, lateral margin involved. exc 07/20/20, margins free.   Osteoarthrosis involving multiple sites    Prostate cancer (HCC) 2019   prostatectomy   Ruptured lumbar disc    L4 & L5   Sleep apnea     Family History: Non contributory to the present illness  Social History: Social History   Socioeconomic History   Marital status: Married    Spouse name: Not on file   Number of children: Not on file   Years of education: Not on file   Highest education level: Not on file  Occupational History   Not on file  Tobacco Use   Smoking status: Former    Current packs/day: 0.00    Types: Cigarettes    Quit date: 06/06/1981    Years since quitting: 42.3   Smokeless tobacco: Former  Building Services Engineer status: Never Used  Substance and Sexual Activity   Alcohol use: Yes    Alcohol/week: 1.0 standard drink of alcohol    Types: 1 Standard drinks or equivalent per week   Drug use: No   Sexual activity: Yes    Birth control/protection: None  Other Topics Concern   Not on file  Social History Narrative   Not on file   Social Drivers of Health   Financial Resource Strain: Low Risk  (10/25/2022)   Received from [facility], [facility]   Overall Financial Resource Strain (CARDIA)    Difficulty of Paying Living Expenses: Not hard at all  Food Insecurity: No Food Insecurity (10/25/2022)   Received from [facility],  [facility]   Hunger Vital Sign    Worried About Running Out of Food in the Last Year: Never true    Ran Out of Food in the Last Year: Never true  Transportation Needs: No Transportation Needs (10/25/2022)   Received from [facility], [facility]   [facility] - Transportation    In the past 12 months, has lack of transportation kept you from medical appointments or from getting medications?: No    Lack of Transportation (Non-Medical): No  Physical Activity: Not on file  Stress: Not on file  Social Connections: Not on file  Intimate Partner Violence: Not on file    Vital Signs: Blood pressure (!) 149/89, pulse 88, resp. rate 16,  height 5' 9 (1.753 m), weight 209 lb (94.8 kg), SpO2 96%. Body mass index is 30.86 kg/m.    Examination: General Appearance: The patient is well-developed, well-nourished, and in no distress. Neck Circumference: 43 cm Skin: Gross inspection of skin unremarkable. Head: normocephalic, no gross deformities. Eyes: no gross deformities noted. ENT: ears appear grossly normal Neurologic: Alert and oriented. No involuntary movements.  STOP BANG RISK ASSESSMENT S (snore) Have you been told that you snore?     NO   T (tired) Are you often tired, fatigued, or sleepy during the day?   NO  O (obstruction) Do you stop breathing, choke, or gasp during sleep? NO   P (pressure) Do you have or are you being treated for high blood pressure? YES   B (BMI) Is your body index greater than 35 kg/m? NO   A (age) Are you 42 years old or older? YES   N (neck) Do you have a neck circumference greater than 16 inches?   YES   G (gender) Are you a male? YES   TOTAL STOP/BANG "YES" ANSWERS 4       A STOP-Bang score of 2 or less is considered low risk, and a score of 5 or more is high risk for having either moderate or severe OSA. For people who score 3 or 4, doctors may need to perform further assessment to determine how  likely they are to have OSA.         EPWORTH SLEEPINESS SCALE:  Scale:  (0)= no chance of dozing; (1)= slight chance of dozing; (2)= moderate chance of dozing; (3)= high chance of dozing  Chance  Situtation    Sitting and reading: 2    Watching TV: 2    Sitting Inactive in public: 2    As a passenger in car: 2      Lying down to rest: 2    Sitting and talking: 1    Sitting quielty after lunch: 2    In a car, stopped in traffic: 0   TOTAL SCORE:   13 out of 24    SLEEP STUDIES:  HST (06/22/15)  AHI 39.8, SUPINE AHI 57.1, SPO2  75% Titration (07/22/15) APAP@ 10-20 cmH2O   CPAP COMPLIANCE DATA:  Date Range: 10/05/2022-10/04/2023  Average Daily Use: 7 hours 41 minutes  Median Use: 7 hours 50 minutes  Compliance for > 4 Hours: 98%  AHI: 6.8 respiratory events per hour  Days Used: 360/365 days  Mask Leak: 40.7  95th Percentile Pressure: 12.8         LABS: No results found for this or any previous visit (from the past 2160 hours).  Radiology: MR ANGIO HEAD WO CONTRAST Result Date: 07/27/2021 CLINICAL DATA:  Stroke follow-up EXAM: MRA NECK WITHOUT CONTRAST MRA HEAD WITHOUT CONTRAST TECHNIQUE: Angiographic images of the Circle of Willis were acquired using MRA technique without intravenous contrast. COMPARISON:  07/01/2021 MRI brain, no prior MRA. FINDINGS: MRA NECK FINDINGS The imaged bilateral carotid arteries demonstrate no significant stenosis, dissection, or occlusion. The vertebral arteries are primarily imaged in the V2 and proximal V3 segments. The left vertebral artery is relatively diminutive. No hemodynamically significant stenosis in the imaged vertebral arteries. No occlusion or dissection. MRA HEAD FINDINGS Both internal carotid arteries are patent to the termini, without stenosis or other abnormality. A1 segments patent. Hypoplastic right A1 normal anterior communicating artery. Anterior cerebral arteries are patent to their distal aspects. Mild  narrowing of the distal left M1 (series 5,  image 120). No other M1 stenosis or occlusion. Normal MCA bifurcations. Distal MCA branches perfused and symmetric. Vertebral arteries are patent. The left vertebral artery is diminutive and primarily supplies the left PICA. Posterior inferior cerebral arteries patent bilaterally. Basilar patent to its distal aspect. Superior cerebral arteries patent bilaterally. PCAs well perfused to their distal aspects without stenosis. The left posterior communicating artery is not visualized. IMPRESSION: 1. No intracranial large vessel occlusion or significant stenosis. 2. No hemodynamically significant stenosis in the imaged portions of the carotid and vertebral arteries. Electronically Signed   By: Donald Campion M.D.   On: 07/27/2021 11:12   MR ANGIO NECK WO CONTRAST Result Date: 07/27/2021 CLINICAL DATA:  Stroke follow-up EXAM: MRA NECK WITHOUT CONTRAST MRA HEAD WITHOUT CONTRAST TECHNIQUE: Angiographic images of the Circle of Willis were acquired using MRA technique without intravenous contrast. COMPARISON:  07/01/2021 MRI brain, no prior MRA. FINDINGS: MRA NECK FINDINGS The imaged bilateral carotid arteries demonstrate no significant stenosis, dissection, or occlusion. The vertebral arteries are primarily imaged in the V2 and proximal V3 segments. The left vertebral artery is relatively diminutive. No hemodynamically significant stenosis in the imaged vertebral arteries. No occlusion or dissection. MRA HEAD FINDINGS Both internal carotid arteries are patent to the termini, without stenosis or other abnormality. A1 segments patent. Hypoplastic right A1 normal anterior communicating artery. Anterior cerebral arteries are patent to their distal aspects. Mild narrowing of the distal left M1 (series 5, image 120). No other M1 stenosis or occlusion. Normal MCA bifurcations. Distal MCA branches perfused and symmetric. Vertebral arteries are patent. The left vertebral artery is  diminutive and primarily supplies the left PICA. Posterior inferior cerebral arteries patent bilaterally. Basilar patent to its distal aspect. Superior cerebral arteries patent bilaterally. PCAs well perfused to their distal aspects without stenosis. The left posterior communicating artery is not visualized. IMPRESSION: 1. No intracranial large vessel occlusion or significant stenosis. 2. No hemodynamically significant stenosis in the imaged portions of the carotid and vertebral arteries. Electronically Signed   By: Donald Campion M.D.   On: 07/27/2021 11:12    No results found.  No results found.    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 1. OSA on CPAP (Primary) The patient does tolerate PAP and reports  benefit from PAP use. His apnea is not controlled, and he is having cheyne stokes breathing pattern. Will get titration. The patient was reminded how to clean equipment and advised to replace supplies routinely. The patient was also counselled on weight loss. The compliance is excellent. The AHI is 6.8.   OSA, with suboptimal control, AHI at 6.8, some  nights with AHI over 10, increasing proportion of centrals, and cheyne stokes breathing. Will  get cpap/bipap titration. F/u after setup.   2. CPAP use counseling CPAP Counseling: had a lengthy discussion with the patient regarding the importance of PAP therapy in management of the sleep apnea. Patient appears to understand the risk factor reduction and also understands the risks associated with untreated sleep apnea. Patient will try to make a good faith effort to remain compliant with therapy. Also instructed the patient on proper cleaning of the device including the water must be changed daily if possible and use of distilled water is preferred. Patient understands that the machine should be regularly cleaned with appropriate recommended cleaning solutions that do not damage the PAP machine for example given white vinegar and water rinses. Other methods such as ozone treatment may not be as good as these simple methods to achieve cleaning.   3. Primary hypertension Elevated pressure discussed with patient. He will monitor and follow up with treating provider.    General Counseling: I have discussed the findings of the evaluation and examination with Jerry Welch.  I have also discussed any further diagnostic evaluation thatmay be needed or ordered today. Jerry Welch verbalizes understanding of the findings of todays visit. We also reviewed his medications today and discussed drug interactions and side effects including but not limited excessive drowsiness and altered mental states. We also discussed that there is always a risk not just to him but also people around him. he has been encouraged to call the office with any questions or concerns that should arise related to todays visit.  No orders of the defined types were placed in this encounter.       I have personally obtained a history, examined the patient, evaluated laboratory and imaging results, formulated the assessment and plan and placed  orders. This patient was seen today by Lauraine Lay, PA-C in collaboration with Dr. Elfreda Bathe.   Elfreda DELENA Bathe, MD [facility] Diplomate ABMS Pulmonary Critical Care Medicine and Sleep Medicine

## 2023-10-08 ENCOUNTER — Ambulatory Visit (INDEPENDENT_AMBULATORY_CARE_PROVIDER_SITE_OTHER): Payer: PPO | Admitting: Internal Medicine

## 2023-10-08 VITALS — BP 149/89 | HR 88 | Resp 16 | Ht 69.0 in | Wt 209.0 lb

## 2023-10-08 DIAGNOSIS — Z7189 Other specified counseling: Secondary | ICD-10-CM | POA: Diagnosis not present

## 2023-10-08 DIAGNOSIS — G4733 Obstructive sleep apnea (adult) (pediatric): Secondary | ICD-10-CM

## 2023-10-08 DIAGNOSIS — I10 Essential (primary) hypertension: Secondary | ICD-10-CM | POA: Diagnosis not present

## 2023-10-08 NOTE — Patient Instructions (Signed)

## 2023-10-24 DIAGNOSIS — Z125 Encounter for screening for malignant neoplasm of prostate: Secondary | ICD-10-CM | POA: Diagnosis not present

## 2023-10-24 DIAGNOSIS — E785 Hyperlipidemia, unspecified: Secondary | ICD-10-CM | POA: Diagnosis not present

## 2023-10-24 DIAGNOSIS — I10 Essential (primary) hypertension: Secondary | ICD-10-CM | POA: Diagnosis not present

## 2023-10-28 NOTE — Progress Notes (Signed)
 QUICK REFERENCE INFORMATION: CMS.gov Medicare Wellness Visits  Medicare Annual Wellness Visit  Subjective:   Jerry Welch is a 73 y.o. Male who presents for an Annual Wellness Visit.  Patient Active Problem List  Diagnosis  . Hyperlipidemia  . Hypertension  . Osteoarthritis  . Elevated glucose  . Elevated serum creatinine  . Enlarged prostate with lower urinary tract symptoms (LUTS)  . Frequency of micturition  . High grade prostatic intraepithelial neoplasia  . Nocturia  . Leg swelling  . Other difficulties with micturition  . Preop cardiovascular exam  . Obesity  . Prostate cancer (CMS/HHS-HCC)  . SOB (shortness of breath)  . Somnolence  . Pseudogout     Outpatient Medications Prior to Visit  Medication Sig Dispense Refill  . aspirin 81 MG EC tablet Take 81 mg by mouth once daily    . atorvastatin (LIPITOR) 40 MG tablet Take 1 tablet (40 mg total) by mouth once daily 90 tablet 3  . cyanocobalamin (VITAMIN B12) 1,000 mcg/mL injection INJECT 1ML INTRAMUSCULARLY EVERY 14 DAYS 2 mL 11  . diclofenac  (VOLTAREN ) 1 % topical gel Apply 2 g topically 4 (four) times daily 300 g 3  . diphenhydramine  HCl (ALLERGY ORAL) Take by mouth once daily    . fluticasone propionate (FLONASE) 50 mcg/actuation nasal spray Place 1 spray into both nostrils 2 (two) times daily 16 g 0  . folic acid (FOLVITE) 1 MG tablet Take 1 tablet (1 mg total) by mouth once daily 90 tablet 3  . hydroxychloroquine (PLAQUENIL) 200 mg tablet Take 1 tablet (200 mg total) by mouth 2 (two) times daily 180 tablet 1  . methotrexate (RHEUMATREX) 2.5 MG tablet Take 6 tablets (15 mg total) by mouth every 7 (seven) days All on the same day 72 tablet 1  . omeprazole (PRILOSEC) 40 MG DR capsule Take 1 capsule (40 mg total) by mouth once daily Take 1 capsule 30 minutes before breakfast on an empty stomach 90 capsule 1  . potassium chloride  (KLOR-CON ) 10 MEQ ER tablet TAKE 1 TABLET BY MOUTH 3 TIMES DAILY. 90 tablet 11  .  triamcinolone  0.5 % ointment Apply topically 2 (two) times daily 30 g 0  . valsartan -hydroCHLOROthiazide  (DIOVAN -HCT) 320-25 mg tablet TAKE 1 TABLET BY MOUTH DAILY 90 tablet 3  . verapamiL  (CALAN -SR) 240 MG SR tablet TAKE ONE (1) TABLET BY MOUTH TWO TIMES PER DAY 180 tablet 3  . latanoprost (XALATAN) 0.005 % ophthalmic solution Place 1 drop into both eyes nightly    . promethazine-dextromethorphan (PROMETHAZINE-DM) 6.25-15 mg/5 mL syrup Take 5 mLs by mouth every 6 (six) hours as needed 120 mL 0  . sodium, potassium, and magnesium (SUPREP) oral solution Take 1 Bottle by mouth as directed One kit contains 2 bottles.  Take both bottles at the times instructed by your provider. (Patient not taking: Reported on 06/12/2023) 354 mL 0   No facility-administered medications prior to visit.    Social History   Socioeconomic History  . Marital status: Married  Occupational History  . Occupation: Retired  Tobacco Use  . Smoking status: Former    Passive exposure: Past  . Smokeless tobacco: Former  Advertising Account Planner  . Vaping status: Never Used  Substance and Sexual Activity  . Alcohol use: Yes  . Drug use: Never  . Sexual activity: Yes    Partners: Female   Social Drivers of Health   Financial Resource Strain: Patient Declined (10/31/2023)   Overall Financial Resource Strain (CARDIA)   . Difficulty of Paying  Living Expenses: Patient declined  Food Insecurity: Patient Declined (10/31/2023)   Hunger Vital Sign   . Worried About Programme Researcher, Broadcasting/film/video in the Last Year: Patient declined   . Ran Out of Food in the Last Year: Patient declined  Transportation Needs: Patient Declined (10/31/2023)   PRAPARE - Transportation   . Lack of Transportation (Medical): Patient declined   . Lack of Transportation (Non-Medical): Patient declined  Housing Stability: Patient Declined (10/31/2023)   Housing Stability Vital Sign   . Unable to Pay for Housing in the Last Year: Patient declined   . Homeless in the Last Year:  Patient declined     Family History  Problem Relation Age of Onset  . Heart failure Mother 76  . High blood pressure (Hypertension) Mother   . Myocardial Infarction (Heart attack) Father 14       MI  . Heart failure Maternal Grandmother   . High blood pressure (Hypertension) Maternal Grandmother   . Heart failure Maternal Grandfather   . High blood pressure (Hypertension) Maternal Grandfather      Past Medical History:  Diagnosis Date  . Colon polyp   . History of cancer   . Hyperlipidemia   . Hypertension   . Osteoarthritis   . Sleep apnea      Recent Hospitalizations? No  Health Habits: Current exercise activities include: Golf Exercise: 1-2 times/week. Diet: in general, a healthy diet    How many times in the past year has patient used an illegal drug or used a prescription medication for non-medical reasons? 0 (>=1 is positive) Has the patient used opioid medication within the last year? No  Current Medical Providers and Suppliers: [facility] Patient Care Team: Valora Lynwood FALCON, MD as PCP - General (Family Medicine) Future Appointments     Date/Time Provider Department Center Visit Type   11/01/2023 8:30 AM Paich, Kaitlin, PA [facility] C RETURN VISIT   12/13/2023 8:30 AM Tobie Lady Plumb, MD [facility] C RETURN VISIT      eye doctor Rheumatology  Age-appropriate Screening Schedule: The list below includes current immunization status and future screening recommendations based on patient's age. Orders for these recommended tests are listed in the plan section. The patient has been provided with a written plan. Immunization History  Administered Date(s) Administered  . COVID-19 Pfizer Monovalent Vaccine 11/20/2019, 12/15/2019  . COVID-19 Pfizer Monovalent Vaccine (original formulation) 08/06/2020  . Flu Vaccine HD-IIV3 High Dose, IM PF(73yo+)(Fluzone) 07/03/2017  . Influenza IIV4, cell derived (Egg-Free) PF (6 mo+) (Flucelvax QUAD)  06/13/2016, 07/12/2021  . Influenza TIV (IM) 06/22/2015  . Influenza, IM unspecified 07/03/2017, 09/26/2018, 06/17/2019, 07/15/2020  . RZV(>=50YR -OR-19+YRS IF  IMMCOMP) VACCINE (SHINGRIX) 06/21/2022  . Varicella zoster (Zostavax) 06/13/2016    Health Maintenance Topics with due status: Overdue     Topic Date Due   Hepatitis C Screen Never done   AAA Screen Never done   Adult Tetanus (Td And Tdap) Never done   Pneumococcal Vaccine: 50+ Never done   Diabetes Screening 05/18/2018   Shingrix 08/16/2022   COVID-19 Vaccine 05/27/2023   Influenza Vaccine 05/27/2023   Health Maintenance Topics with due status: Not Due     Topic Last Completion Date   Colorectal Cancer Screening 01/11/2023   Creatinine Level 10/24/2023   Potassium Level 10/24/2023   Lipid Panel 10/24/2023   Serum Calcium 10/24/2023   Depression Screening 10/31/2023   Medicare Initial or AWV 10/31/2023   RSV Immunization Pregnant or 60+ Not Due  Health Maintenance Topics with due status: Aged Out     Topic Date Due   Hib Vaccines Aged Out   Hepatitis A Vaccines Aged Out   Meningococcal B Vaccine Aged Out   Meningococcal ACWY Vaccine Aged Out   HPV Vaccines Aged Out     Depression Screen-PHQ2/9 completed today  PHQ-2 Over the past 2 weeks, how often have you been bothered by any of the following problems? Little interest or pleasure in doing things: Not at all Feeling down, depressed, or hopeless: Not at all Patient Health Questionnaire-2 Score: 0 PHQ-2 Over the last 2 weeks, how often have you been bothered by any of the following problems? Little interest or pleasure in doing things: Not at all Feeling down, depressed, or hopeless: Not at all Patient Health Questionnaire-2 Score: 0  PHQ-9 (if PHQ >=3)    PHQ-2 Interpretation Values between 0-3 are considered not significant for depression  PHQ-9 Interpretation and Treatment Recommendations:  0-4= None  5-9= Mild / Treatment: Support, educate to  call if worse; return in one month  10-14= Moderate / Treatment: Support, watchful waiting; Antidepressant or Psychotherapy  15-19= Moderately severe / Treatment: Antidepressant OR Psychotherapy  >= 20 = Major depression, severe / Antidepressant AND Psychotherapy  Patient Health Risk Assessment questionnaire ( HRA <redacted file path>) (if patient completed in MyChart or added in flowsheet)    * No data to display          Functional Ability/Safety Screen: 1. Was the patient's timed Get Up and Go Test unsteady or longer than 30 sec? No  How to perform Timed Up and Go test (TUG): [email or web address]  2. Does the patient have difficulty with: Shaune index)  Bathing: No   Dressing: No Toileting: No   Transferring: No  Continence: No  Feeding: No   3. Does the home have: Rugs in the hallway: No Grab bars in the bathroom: No Stairs in home: No Handrails on the stairs: not applicable Poor lighting: No  Hearing Evaluation: Does the patient have trouble hearing the television when others do not? No Does the patient strain to hear/understand conversations? No  Advance Care Planning: Patient has executed an Advance Directive: yes  If no, patient was given the opportunity to execute an Advance Directive today? n/a  Are the patient's advanced directives in [place]? no This patient has the ability to prepare an Advance Directive: Yes Provider is willing to follow the patient's wishes: Yes  Cognitive Assessment: Cognitive screen used: No problems Results: The patient does not have any evidence of any cognitive problems and denies any change in mood/affect, appearance, speech, memory or motor skills.  Identification of Risk Factors: Risk factors include: weight  and inactivity  Review of Systems  Objective:   Vitals:   10/31/23 1157  BP: 132/86  Pulse: 59  Weight: 94.3 kg (208 lb)  Height: 175.3 cm (5' 9)   Body mass index is 30.72  kg/m. Home vitals:     BP 132/86   Pulse 59   Ht 175.3 cm (5' 9)   Wt 94.3 kg (208 lb)   BMI 30.72 kg/m   General Appearance:    Alert, cooperative, no distress, appears stated age  Head:    Normocephalic, without obvious abnormality, atraumatic  Eyes:    PERRL, conjunctiva/corneas clear, EOM's intact, fundi    benign, both eyes       Ears:    Normal TM's and external ear canals, both ears  Nose:   Nares  normal, septum midline, mucosa normal, no drainage   or sinus tenderness  Throat:   Lips, mucosa, and tongue normal; teeth and gums normal  Neck:   Supple, symmetrical, trachea midline, no adenopathy;       thyroid:  No enlargement/tenderness/nodules; no carotid   bruit or JVD  Back:     Symmetric, no curvature, ROM normal, no CVA tenderness  Lungs:     Clear to auscultation bilaterally, respirations unlabored  Chest wall:    No tenderness or deformity  Heart:    Regular rate and rhythm, S1 and S2 normal, no murmur, rub   or gallop  Abdomen:     Soft, non-tender, bowel sounds active all four quadrants,    no masses, no organomegaly, protuberant  Genitalia:  Deferred  Rectal:  Deferred  Extremities:   Extremities normal, atraumatic, no cyanosis or edema  Pulses:   2+ and symmetric all extremities  Skin:   Skin color, texture, turgor normal, no rashes or lesions  Lymph nodes:   Cervical, supraclavicular, and axillary nodes normal  Neurologic:   CNII-XII intact. Normal strength, sensation and reflexes      throughout      Assessment/Plan:   Patient Self-Management and Personalized Health Advice The patient has been provided with information about: exercise and weight management  During the course of the visit the patient was educated and counseled about appropriate screening and preventive services including:  Influenza vaccine Pneumococcal conjugate vaccine 20 (PCV20)  The patient's BMI is above the acceptable range; discussed or provided materials on  diet/exercise  Diagnoses and all orders for this visit:  Medicare annual wellness visit, subsequent  Routine adult health maintenance Encouraged healthy diet, regular exercise, regular health maintenance Primary hypertension -     CBC w/auto Differential (3 Part); Future -     Urinalysis w/Microscopic; Future Good control, continue current medication, continue to monitor Hyperlipidemia, unspecified hyperlipidemia type -     Lipid Panel w/calc LDL; Future -     Comprehensive Metabolic Panel (CMP); Future Good control, continue atorvastatin Pseudogout Stable on methotrexate and Plaquenil, make sure he gets an eye exam, continue to follow with rheumatology Prostate cancer (CMS/HHS-HCC) PSA less than 0.01, continue to monitor Screening for prostate cancer -     PSA, Total (Screen); Future  Low back pain without sciatica, unspecified back pain laterality, unspecified chronicity Gave him a handout on low back exercises, work on losing abdominal fat Other orders -     Follow up in Primary Care -     Follow up in Primary Care; Future           Follow-up     Normal Orders This Visit   Follow up in Primary Care [[id number] Custom]    Questions:   Does this order need to be coordinated with another visit at scheduling? The patient will need to schedule this at the front desk before they leave.: No   Who is this follow-up with?: PCP   What type of follow up is needed?: Annual Wellness   Allow telemedicine?: In Person Only   What's the reason for follow up?:     Future Labs/Procedures Expected by Expires   Follow up in Primary Care [[id number] Custom]  10/30/2024 12/28/2024   Questions:     Does this order need to be coordinated with another visit or should it be hidden from the patient portal? If yes to either, the patient will need to stop by the front desk or call  to schedule.: No   Who is this follow-up with?: PCP   What type of follow up is needed?: Physical   What's the reason for  follow up?:             Future Appointments     Date/Time Provider Department Center Visit Type   11/01/2023 8:30 AM Paich, Kaitlin, PA [facility] C RETURN VISIT   12/13/2023 8:30 AM Tobie Lady Plumb, MD [facility] C RETURN VISIT       An after visit summary was provided for the patient either in written format or through MyChart  *Some images could not be shown.

## 2023-10-31 DIAGNOSIS — E785 Hyperlipidemia, unspecified: Secondary | ICD-10-CM | POA: Diagnosis not present

## 2023-10-31 DIAGNOSIS — M1120 Other chondrocalcinosis, unspecified site: Secondary | ICD-10-CM | POA: Diagnosis not present

## 2023-10-31 DIAGNOSIS — M545 Low back pain, unspecified: Secondary | ICD-10-CM | POA: Diagnosis not present

## 2023-10-31 DIAGNOSIS — Z125 Encounter for screening for malignant neoplasm of prostate: Secondary | ICD-10-CM | POA: Diagnosis not present

## 2023-10-31 DIAGNOSIS — C61 Malignant neoplasm of prostate: Secondary | ICD-10-CM | POA: Diagnosis not present

## 2023-10-31 DIAGNOSIS — Z1331 Encounter for screening for depression: Secondary | ICD-10-CM | POA: Diagnosis not present

## 2023-10-31 DIAGNOSIS — Z0000 Encounter for general adult medical examination without abnormal findings: Secondary | ICD-10-CM | POA: Diagnosis not present

## 2023-10-31 DIAGNOSIS — I10 Essential (primary) hypertension: Secondary | ICD-10-CM | POA: Diagnosis not present

## 2023-11-01 DIAGNOSIS — R42 Dizziness and giddiness: Secondary | ICD-10-CM | POA: Diagnosis not present

## 2023-11-01 DIAGNOSIS — G4733 Obstructive sleep apnea (adult) (pediatric): Secondary | ICD-10-CM | POA: Diagnosis not present

## 2023-11-01 DIAGNOSIS — R519 Headache, unspecified: Secondary | ICD-10-CM | POA: Diagnosis not present

## 2023-11-06 DIAGNOSIS — G4733 Obstructive sleep apnea (adult) (pediatric): Secondary | ICD-10-CM | POA: Diagnosis not present

## 2023-11-08 DIAGNOSIS — J9801 Acute bronchospasm: Secondary | ICD-10-CM | POA: Diagnosis not present

## 2023-11-08 DIAGNOSIS — J019 Acute sinusitis, unspecified: Secondary | ICD-10-CM | POA: Diagnosis not present

## 2023-11-08 DIAGNOSIS — B9689 Other specified bacterial agents as the cause of diseases classified elsewhere: Secondary | ICD-10-CM | POA: Diagnosis not present

## 2023-11-08 DIAGNOSIS — J209 Acute bronchitis, unspecified: Secondary | ICD-10-CM | POA: Diagnosis not present

## 2023-11-14 ENCOUNTER — Ambulatory Visit: Payer: PPO | Admitting: Dermatology

## 2023-11-27 ENCOUNTER — Other Ambulatory Visit: Payer: Self-pay

## 2023-11-27 DIAGNOSIS — C61 Malignant neoplasm of prostate: Secondary | ICD-10-CM

## 2023-11-30 ENCOUNTER — Other Ambulatory Visit: Payer: PPO

## 2023-11-30 DIAGNOSIS — C61 Malignant neoplasm of prostate: Secondary | ICD-10-CM | POA: Diagnosis not present

## 2023-12-01 LAB — PSA: Prostate Specific Ag, Serum: <0.1 ng/mL (ref 0.0–4.0)

## 2023-12-03 NOTE — Progress Notes (Deleted)
 12/04/2023 8:35 AM   Riggins York Ram 1951/09/13 [id number]  Referring provider: Jerl Mina, MD [address] [place],  [place] [address]  Urological history: 1. Prostate cancer -PSA (11/2023) <0.1 -robotic prostatectomy with Dr. Apolinar Junes in 06/2017.  Surgical pathology consistent with Gleason 3+4.  There was evidence of extraprostatic extension.  Negative margins.  Negative lymph nodes.  Negative seminal vesicles. pT3a N0.  2. High risk hematuria -former smoker -CTU 09/2019 3 mm left renal calculus. No evidence of ureteral calculi or hydronephrosis.  No radiographic evidence of urinary tract neoplasm.  5.6 cm well-circumscribed fluid collection in the Space of [place], with linear soft tissue density extending to the umbilicus.  Differential diagnosis includes urachal cyst or postop fluid collection.  Colonic diverticulosis. No radiographic evidence of diverticulitis.  Mild hepatic steatosis -Cysto unremarkable with Dr. Apolinar Junes in 09/2019  3. SUI -contributing factors of age, pelvic surgery, sleep apnea, obesity and diuretic  No chief complaint on file.   HPI: Jerry Welch is a 73 y.o. male who presents today for follow up.    Previous records reviewed.     Has sleep apnea and is compliant with CPAP.     UA (09/2023) light yellow clear, specific gravity 1.012, pH 7.5, WBC less than 1, RBCs less than 1, 0-5 bacteria and 0 squames.  PVR: ***   PMH: Past Medical History:  Diagnosis Date   Back pain    Basal cell carcinoma 05/17/2020   Right top of shoulder. Nodular pattern. EDC   Basal cell carcinoma 05/17/2020   Left inferior scapula. Nodular pattern. [facility] 07/27/20   Basal cell carcinoma 02/17/2021   L neck post auricular - ED&C    Dysplastic nevus 05/17/2020   Left mid to upper back 6cm lat to spine. Mild atypia, limited margins free.    Dysplastic nevus 08/10/2020   left pubic area, mild atypia, limited margins free   GERD  (gastroesophageal reflux disease)    History of hiatal hernia    HLD (hyperlipidemia) 04/30/2015   Hypertension    Melanoma (HCC) 05/17/2020   Mid to upper back, left paraspinal. MMIS, lateral margin involved. exc 07/20/20, margins free.   Osteoarthrosis involving multiple sites    Prostate cancer (HCC) 2019   prostatectomy   Ruptured lumbar disc    L4 & L5   Sleep apnea     Surgical History: Past Surgical History:  Procedure Laterality Date   APPENDECTOMY     BACK SURGERY  09/2013   L4-L5    BASAL CELL CARCINOMA EXCISION  2012   right neck    PELVIC LYMPH NODE DISSECTION Bilateral 07/09/2017   Procedure: PELVIC LYMPH NODE DISSECTION;  Surgeon: Vanna Scotland, MD;  Location: [facility] ORS;  Service: Urology;  Laterality: Bilateral;   PROSTATE BIOPSY     REPLACEMENT TOTAL KNEE  2002   left knee    REPLACEMENT TOTAL KNEE     right x 2   ROBOT ASSISTED LAPAROSCOPIC RADICAL PROSTATECTOMY N/A 07/09/2017   Procedure: ROBOTIC ASSISTED LAPAROSCOPIC RADICAL PROSTATECTOMY;  Surgeon: Vanna Scotland, MD;  Location: [facility] ORS;  Service: Urology;  Laterality: N/A;    Home Medications:  Allergies as of 12/04/2023       Reactions   Amlodipine Other (See Comments)   LE EDEMA   Lisinopril    Cough-onset        Medication List        Accurate as of December 03, 2023  8:35 AM. If you  have any questions, ask your nurse or doctor.          atorvastatin 40 MG tablet Commonly known as: LIPITOR Take 40 mg by mouth daily.   cyanocobalamin 1000 MCG/ML injection Commonly known as: VITAMIN B12 Inject 1,000 mcg into the muscle every 14 (fourteen) days.   diclofenac Sodium 1 % Gel Commonly known as: VOLTAREN Apply 2 g topically 4 (four) times daily.   doxazosin 2 MG tablet Commonly known as: Cardura Take 1 tablet (2 mg total) by mouth 2 (two) times daily.   fluticasone 50 MCG/ACT nasal spray Commonly known as: FLONASE Place 1 spray into both nostrils 2 (two) times daily.    hydroxychloroquine 200 MG tablet Commonly known as: PLAQUENIL Take 1 tablet by mouth 2 (two) times daily.   latanoprost 0.005 % ophthalmic solution Commonly known as: XALATAN Place 1 drop into both eyes at bedtime.   loratadine 10 MG tablet Commonly known as: CLARITIN Take 10 mg by mouth daily.   meclizine 25 MG tablet Commonly known as: ANTIVERT Take 1 tablet (25 mg total) by mouth 2 (two) times daily as needed for dizziness.   methotrexate 2.5 MG tablet Commonly known as: RHEUMATREX Take 15 mg by mouth once a week.   naproxen sodium 220 MG tablet Commonly known as: ALEVE Take 440 mg by mouth daily as needed (pain).   potassium chloride 10 MEQ tablet Commonly known as: KLOR-CON Take 10 mEq by mouth 3 (three) times daily.   valsartan-hydrochlorothiazide 320-25 MG tablet Commonly known as: DIOVAN-HCT Take 1 tablet by mouth daily.   verapamil 240 MG CR tablet Commonly known as: CALAN-SR Take 240 mg by mouth 2 (two) times daily.        Allergies:  Allergies  Allergen Reactions   Amlodipine Other (See Comments)    LE EDEMA   Lisinopril     Cough-onset     Family History: Family History  Problem Relation Age of Onset   Heart failure Mother    Heart attack Maternal Grandfather    Bladder Cancer Neg Hx    Prostate cancer Neg Hx    Kidney cancer Neg Hx     Social History:  reports that he quit smoking about 42 years ago. His smoking use included cigarettes. He has quit using smokeless tobacco. He reports current alcohol use of about 1.0 standard drink of alcohol per week. He reports that he does not use drugs.  ROS: For pertinent review of systems please refer to history of present illness  Physical Exam: There were no vitals taken for this visit.  Constitutional:  Well nourished. Alert and oriented, No acute distress. HEENT: [place] AT, moist mucus membranes.  Trachea midline, no masses. Cardiovascular: No clubbing, cyanosis, or edema. Respiratory: Normal  respiratory effort, no increased work of breathing. GI: Abdomen is soft, non tender, non distended, no abdominal masses. Liver and spleen not palpable.  No hernias appreciated.  Stool sample for occult testing is not indicated.   GU: No CVA tenderness.  No bladder fullness or masses.  Patient with circumcised/uncircumcised phallus. ***Foreskin easily retracted***  Urethral meatus is patent.  No penile discharge. No penile lesions or rashes. Scrotum without lesions, cysts, rashes and/or edema.  Testicles are located scrotally bilaterally. No masses are appreciated in the testicles. Left and right epididymis are normal. Rectal: Patient with  normal sphincter tone. Anus and perineum without scarring or rashes. No rectal masses are appreciated. Prostate is approximately *** grams, *** nodules are appreciated. Seminal vesicles are  normal. Skin: No rashes, bruises or suspicious lesions. Lymph: No cervical or inguinal adenopathy. Neurologic: Grossly intact, no focal deficits, moving all 4 extremities. Psychiatric: Normal mood and affect.   Laboratory Data: Contains abnormal data CBC w/auto Differential (3 Part) Order: [id number] Component Ref Range & Units 1 mo ago  WBC (White Blood Cell Count) 4.1 - 10.2 10^3/uL 4.6  RBC (Red Blood Cell Count) 4.69 - 6.13 10^6/uL 5.27  Hemoglobin 14.1 - 18.1 gm/dL 29.5  Hematocrit 28.4 - 52.0 % 45.6  MCV (Mean Corpuscular Volume) 80.0 - 100.0 fl 86.5  MCH (Mean Corpuscular Hemoglobin) 27.0 - 31.2 pg 29  MCHC (Mean Corpuscular Hemoglobin Concentration) 32.0 - 36.0 gm/dL 13.2  Platelet Count 440 - 450 10^3/uL 188  RDW-CV (Red Cell Distribution Width) 11.6 - 14.8 % 13  MPV (Mean Platelet Volume) 9.4 - 12.4 fl 10.8  Neutrophils 1.50 - 7.80 10^3/uL 3.3  Lymphocytes 1.00 - 3.60 10^3/uL 0.9 Low   Mixed Count 0.10 - 0.90 10^3/uL 0.4  Neutrophil % 32.0 - 70.0 % 71.4 High   Lymphocyte % 10.0 - 50.0 % 19.5  Mixed % 3.0 - 14.4 % 9.1  Resulting Agency  [facility]   Specimen Collected: 10/24/23 08:10   Performed by: [facility] Last Resulted: 10/24/23 09:04  Received From: [facility]  Result Received: 10/28/23 19:57   Lipid Panel w/calc LDL Order: [id number] Component Ref Range & Units 1 mo ago  Cholesterol, Total 100 - 200 mg/dL 440  Triglyceride 35 - 199 mg/dL 347  HDL (High Density Lipoprotein) Cholesterol 29.0 - 71.0 mg/dL 39  LDL Calculated 0 - 130 mg/dL 54  VLDL Cholesterol mg/dL 21  Cholesterol/HDL Ratio 2.9  Resulting Agency [facility]   Specimen Collected: 10/24/23 08:10   Performed by: [facility] Last Resulted: 10/24/23 15:12  Received From: [facility]  Result Received: 10/28/23 19:57   Comprehensive Metabolic Panel (CMP) Order: [id number] Component Ref Range & Units 1 mo ago  Glucose 70 - 110 mg/dL 564  Sodium 332 - 951 mmol/L 141  Potassium 3.6 - 5.1 mmol/L 3.7  Chloride 97 - 109 mmol/L 102  Carbon Dioxide (CO2) 22.0 - 32.0 mmol/L 32.7 High   Urea Nitrogen (BUN) 7 - 25 mg/dL 14  Creatinine 0.7 - 1.3 mg/dL 1.1  Glomerular Filtration Rate (eGFR) >60 mL/min/1.73sq m 71  Comment: CKD-EPI (2021) does not include patient's race in the calculation of eGFR.  Monitoring changes of plasma creatinine and eGFR over time is useful for monitoring kidney function.  Interpretive Ranges for eGFR (CKD-EPI 2021):  eGFR:       >60 mL/min/1.73 sq. m - Normal eGFR:       30-59 mL/min/1.73 sq. m - Moderately Decreased eGFR:       15-29 mL/min/1.73 sq. m  - Severely Decreased eGFR:       < 15 mL/min/1.73 sq. m  - Kidney Failure   Note: These eGFR calculations do not apply in acute situations when eGFR is changing rapidly or patients on dialysis.  Calcium 8.7 - 10.3 mg/dL 9.5  AST 8 - 39 U/L 28  ALT 6 - 57 U/L 29  Alk Phos (alkaline Phosphatase) 34 - 104 U/L 83  Albumin 3.5 - 4.8 g/dL 4.4  Bilirubin, Total 0.3 -  1.2 mg/dL 0.9  Protein, Total 6.1 - 7.9 g/dL 6.6  A/G Ratio 1.0 - 5.0 gm/dL 2  Resulting Agency [facility]   Specimen Collected: 10/24/23 08:10   Performed by: [facility] Last Resulted: 10/24/23 15:07  Received From: [facility]  Result Received: 10/28/23 19:57   Urinalysis Urinalysis w/Microscopic Order: [id number] Component Ref Range & Units 1 mo ago  Color Colorless, Straw, Light Yellow, Yellow, Dark Yellow Light Yellow  Clarity Clear Clear  Specific Gravity 1.005 - 1.030 1.012  pH, Urine 5.0 - 8.0 7.5  Protein, Urinalysis Negative mg/dL Negative  Glucose, Urinalysis Negative mg/dL Negative  Ketones, Urinalysis Negative mg/dL Negative  Blood, Urinalysis Negative Negative  Nitrite, Urinalysis Negative Negative  Leukocyte Esterase, Urinalysis Negative Negative  Bilirubin, Urinalysis Negative Negative  Urobilinogen, Urinalysis 0.2 - 1.0 mg/dL 0.2  WBC, UA <=5 /hpf <1  Red Blood Cells, Urinalysis <=3 /hpf <1  Bacteria, Urinalysis 0 - 5 /hpf 0-5  Squamous Epithelial Cells, Urinalysis /hpf 0  Resulting Agency [facility]   Specimen Collected: 10/24/23 08:10   Performed by: [facility] Last Resulted: 10/24/23 11:50  [facility]  Result Received: 10/28/23 19:57  I have reviewed the labs.   Pertinent Imaging: ***   Assessment & Plan:    1. Prostate cancer (HCC) -PSA undetectable  2. Stress incontinence, male -minimal bother  3. High risk hematuria -work up in 2021 -NED -no reports of gross hematuria -UA negative for micro heme    4. Night sweats/fatigue -We will go ahead and check a serum testosterone level -I explained that now after patient has had undetectable PSAs for 3 to 5 years, we could consider testosterone replacement if we find it appropriate  No follow-ups on file.  Cloretta Ned  [facility] [address], [address] [place], [place] [address] [phone]

## 2023-12-04 ENCOUNTER — Ambulatory Visit: Payer: Self-pay | Admitting: Urology

## 2023-12-13 DIAGNOSIS — M9903 Segmental and somatic dysfunction of lumbar region: Secondary | ICD-10-CM | POA: Diagnosis not present

## 2023-12-13 DIAGNOSIS — M5416 Radiculopathy, lumbar region: Secondary | ICD-10-CM | POA: Diagnosis not present

## 2023-12-13 DIAGNOSIS — M9905 Segmental and somatic dysfunction of pelvic region: Secondary | ICD-10-CM | POA: Diagnosis not present

## 2023-12-13 DIAGNOSIS — Z7960 Long term (current) use of unspecified immunomodulators and immunosuppressants: Secondary | ICD-10-CM | POA: Diagnosis not present

## 2023-12-13 DIAGNOSIS — G8929 Other chronic pain: Secondary | ICD-10-CM | POA: Diagnosis not present

## 2023-12-13 DIAGNOSIS — M1120 Other chondrocalcinosis, unspecified site: Secondary | ICD-10-CM | POA: Diagnosis not present

## 2023-12-13 DIAGNOSIS — M5417 Radiculopathy, lumbosacral region: Secondary | ICD-10-CM | POA: Diagnosis not present

## 2023-12-13 DIAGNOSIS — M79642 Pain in left hand: Secondary | ICD-10-CM | POA: Diagnosis not present

## 2023-12-14 ENCOUNTER — Other Ambulatory Visit: Payer: Self-pay | Admitting: Rheumatology

## 2023-12-14 DIAGNOSIS — M1120 Other chondrocalcinosis, unspecified site: Secondary | ICD-10-CM

## 2023-12-14 DIAGNOSIS — G8929 Other chronic pain: Secondary | ICD-10-CM

## 2023-12-17 ENCOUNTER — Ambulatory Visit
Admission: RE | Admit: 2023-12-17 | Discharge: 2023-12-17 | Disposition: A | Discharge status: Home / Self Care | Source: Ambulatory Visit | Attending: Rheumatology | Admitting: Rheumatology

## 2023-12-17 DIAGNOSIS — G8929 Other chronic pain: Secondary | ICD-10-CM

## 2023-12-17 DIAGNOSIS — M9905 Segmental and somatic dysfunction of pelvic region: Secondary | ICD-10-CM | POA: Diagnosis not present

## 2023-12-17 DIAGNOSIS — M19042 Primary osteoarthritis, left hand: Secondary | ICD-10-CM | POA: Diagnosis not present

## 2023-12-17 DIAGNOSIS — M9903 Segmental and somatic dysfunction of lumbar region: Secondary | ICD-10-CM | POA: Diagnosis not present

## 2023-12-17 DIAGNOSIS — M5416 Radiculopathy, lumbar region: Secondary | ICD-10-CM | POA: Diagnosis not present

## 2023-12-17 DIAGNOSIS — M1120 Other chondrocalcinosis, unspecified site: Secondary | ICD-10-CM

## 2023-12-17 DIAGNOSIS — M5417 Radiculopathy, lumbosacral region: Secondary | ICD-10-CM | POA: Diagnosis not present

## 2023-12-18 ENCOUNTER — Ambulatory Visit: Payer: PPO | Admitting: Dermatology

## 2023-12-18 ENCOUNTER — Encounter: Payer: Self-pay | Admitting: Dermatology

## 2023-12-18 DIAGNOSIS — Z86006 Personal history of melanoma in-situ: Secondary | ICD-10-CM | POA: Diagnosis not present

## 2023-12-18 DIAGNOSIS — L570 Actinic keratosis: Secondary | ICD-10-CM

## 2023-12-18 DIAGNOSIS — D1801 Hemangioma of skin and subcutaneous tissue: Secondary | ICD-10-CM

## 2023-12-18 DIAGNOSIS — L820 Inflamed seborrheic keratosis: Secondary | ICD-10-CM

## 2023-12-18 DIAGNOSIS — Z85828 Personal history of other malignant neoplasm of skin: Secondary | ICD-10-CM | POA: Diagnosis not present

## 2023-12-18 DIAGNOSIS — Z1283 Encounter for screening for malignant neoplasm of skin: Secondary | ICD-10-CM | POA: Diagnosis not present

## 2023-12-18 DIAGNOSIS — L821 Other seborrheic keratosis: Secondary | ICD-10-CM | POA: Diagnosis not present

## 2023-12-18 DIAGNOSIS — Z7189 Other specified counseling: Secondary | ICD-10-CM

## 2023-12-18 DIAGNOSIS — L578 Other skin changes due to chronic exposure to nonionizing radiation: Secondary | ICD-10-CM

## 2023-12-18 DIAGNOSIS — W908XXA Exposure to other nonionizing radiation, initial encounter: Secondary | ICD-10-CM

## 2023-12-18 DIAGNOSIS — L814 Other melanin hyperpigmentation: Secondary | ICD-10-CM

## 2023-12-18 DIAGNOSIS — D229 Melanocytic nevi, unspecified: Secondary | ICD-10-CM

## 2023-12-18 DIAGNOSIS — Z79899 Other long term (current) drug therapy: Secondary | ICD-10-CM

## 2023-12-18 DIAGNOSIS — Z86018 Personal history of other benign neoplasm: Secondary | ICD-10-CM | POA: Diagnosis not present

## 2023-12-18 DIAGNOSIS — B353 Tinea pedis: Secondary | ICD-10-CM | POA: Diagnosis not present

## 2023-12-18 DIAGNOSIS — Z85820 Personal history of malignant melanoma of skin: Secondary | ICD-10-CM

## 2023-12-18 MED ORDER — KETOCONAZOLE 2 % EX CREA: TOPICAL_CREAM | CUTANEOUS | 4 refills | Status: DC

## 2023-12-18 NOTE — Progress Notes (Signed)
 Follow-Up Visit   Subjective  Jerry Welch is a 73 y.o. male who presents for the following: Skin Cancer Screening and Full Body Skin Exam hx of bcc, hx of melanoma, hx of dysplastic nevi, hx of aks, hx of isk  Reports some spots at his face he would like checked today.   The patient presents for Total-Body Skin Exam (TBSE) for skin cancer screening and mole check. The patient has spots, moles and lesions to be evaluated, some may be new or changing and the patient may have concern these could be cancer.  The following portions of the chart were reviewed this encounter and updated as appropriate: medications, allergies, medical history  Review of Systems:  No other skin or systemic complaints except as noted in HPI or Assessment and Plan.  Objective  Well appearing patient in no apparent distress; mood and affect are within normal limits.  A full examination was performed including scalp, head, eyes, ears, nose, lips, neck, chest, axillae, abdomen, back, buttocks, bilateral upper extremities, bilateral lower extremities, hands, feet, fingers, toes, fingernails, and toenails. All findings within normal limits unless otherwise noted below.   Relevant physical exam findings are noted in the Assessment and Plan.  back x 20 (20) Erythematous stuck-on, waxy papule or plaque b/l arms , face, hands, neck x 17 (17) Erythematous thin papules/macules with gritty scale.   Assessment & Plan   SKIN CANCER SCREENING PERFORMED TODAY.  ACTINIC DAMAGE - Chronic condition, secondary to cumulative UV/sun exposure - diffuse scaly erythematous macules with underlying dyspigmentation - Recommend daily broad spectrum sunscreen SPF 30+ to sun-exposed areas, reapply every 2 hours as needed.  - Staying in the shade or wearing long sleeves, sun glasses (UVA+UVB protection) and wide brim hats (4-inch brim around the entire circumference of the hat) are also recommended for sun protection.  - Call for  new or changing lesions.  LENTIGINES, SEBORRHEIC KERATOSES, HEMANGIOMAS - Benign normal skin lesions - Benign-appearing - Call for any changes  MELANOCYTIC NEVI - Tan-brown and/or pink-flesh-colored symmetric macules and papules - Benign appearing on exam today - Observation - Call clinic for new or changing moles - Recommend daily use of broad spectrum spf 30+ sunscreen to sun-exposed areas.   History of Basal Cell Carcinoma of the Skin 05/17/2020 right top of shoulder - nodular pattern [facility]  05/17/2020 Left inferior scapula - nodular pattern [facility] 07/27/2020 02/17/2021  left neck post auricular  ED&C  - No evidence of recurrence today - Recommend regular full body skin exams - Recommend daily broad spectrum sunscreen SPF 30+ to sun-exposed areas, reapply every 2 hours as needed.  - Call if any new or changing lesions are noted between office visits   History of Melanoma in Situ Mid to upper back, left paraspinal 04/2020 lateral margin involved . Excision 07/20/2020 margin free - No evidence of recurrence today - No lymphadenopathy - Recommend regular full body skin exams - Recommend daily broad spectrum sunscreen SPF 30+ to sun-exposed areas, reapply every 2 hours as needed.  - Call if any new or changing lesions are noted between office visits   History of Dysplastic Nevi 05/17/2020 Left mid to upper back 6 cm lateral to spine mild atypia limited margins free 08/10/2020 Left pubic area - mild atypia - limited margins free  - No evidence of recurrence today - Recommend regular full body skin exams - Recommend daily broad spectrum sunscreen SPF 30+ to sun-exposed areas, reapply every 2 hours as needed.  - Call  if any new or changing lesions are noted between office visits  TINEA PEDIS Exam: Scaling and maceration web spaces and over distal and lateral soles. Chronic and persistent condition with duration or expected duration over one year. Condition is symptomatic / bothersome to  patient. Not to goal. Treatment Plan: Start ketoconazole 2 % cream - apply topically to both feet including toes nightly for a few months.   TINEA PEDIS OF BOTH FEET   Related Medications ketoconazole (NIZORAL) 2 % cream Apply topically to both feet (entire foot and toes) daily at bedtime INFLAMED SEBORRHEIC KERATOSIS (20) back x 20 (20) Symptomatic, irritating, patient would like treated. Destruction of lesion - back x 20 (20) Complexity: simple   Destruction method: cryotherapy   Informed consent: discussed and consent obtained   Timeout:  patient name, date of birth, surgical site, and procedure verified Lesion destroyed using liquid nitrogen: Yes   Region frozen until ice ball extended beyond lesion: Yes   Outcome: patient tolerated procedure well with no complications   Post-procedure details: wound care instructions given   ACTINIC KERATOSIS (17) b/l arms , face, hands, neck x 17 (17) Actinic keratoses are precancerous spots that appear secondary to cumulative UV radiation exposure/sun exposure over time. They are chronic with expected duration over 1 year. A portion of actinic keratoses will progress to squamous cell carcinoma of the skin. It is not possible to reliably predict which spots will progress to skin cancer and so treatment is recommended to prevent development of skin cancer.  Recommend daily broad spectrum sunscreen SPF 30+ to sun-exposed areas, reapply every 2 hours as needed.  Recommend staying in the shade or wearing long sleeves, sun glasses (UVA+UVB protection) and wide brim hats (4-inch brim around the entire circumference of the hat). Call for new or changing lesions. Destruction of lesion - b/l arms , face, hands, neck x 17 (17) Complexity: simple   Destruction method: cryotherapy   Informed consent: discussed and consent obtained   Timeout:  patient name, date of birth, surgical site, and procedure verified Lesion destroyed using liquid nitrogen: Yes    Region frozen until ice ball extended beyond lesion: Yes   Outcome: patient tolerated procedure well with no complications   Post-procedure details: wound care instructions given   Return in about 6 months (around 06/19/2024) for TBSE.  IAsher Muir, CMA, am acting as scribe for Armida Sans, MD.   Documentation: I have reviewed the above documentation for accuracy and completeness, and I agree with the above.  Armida Sans, MD

## 2023-12-18 NOTE — Patient Instructions (Addendum)
Cryotherapy Aftercare  Wash gently with soap and water everyday.   Apply Vaseline and Band-Aid daily until healed.   Actinic keratoses are precancerous spots that appear secondary to cumulative UV radiation exposure/sun exposure over time. They are chronic with expected duration over 1 year. A portion of actinic keratoses will progress to squamous cell carcinoma of the skin. It is not possible to reliably predict which spots will progress to skin cancer and so treatment is recommended to prevent development of skin cancer.  Recommend daily broad spectrum sunscreen SPF 30+ to sun-exposed areas, reapply every 2 hours as needed.  Recommend staying in the shade or wearing long sleeves, sun glasses (UVA+UVB protection) and wide brim hats (4-inch brim around the entire circumference of the hat). Call for new or changing lesions.   Seborrheic Keratosis  What causes seborrheic keratoses? Seborrheic keratoses are harmless, common skin growths that first appear during adult life.  As time goes by, more growths appear.  Some people may develop a large number of them.  Seborrheic keratoses appear on both covered and uncovered body parts.  They are not caused by sunlight.  The tendency to develop seborrheic keratoses can be inherited.  They vary in color from skin-colored to gray, brown, or even black.  They can be either smooth or have a rough, warty surface.   Seborrheic keratoses are superficial and look as if they were stuck on the skin.  Under the microscope this type of keratosis looks like layers upon layers of skin.  That is why at times the top layer may seem to fall off, but the rest of the growth remains and re-grows.    Treatment Seborrheic keratoses do not need to be treated, but can easily be removed in the office.  Seborrheic keratoses often cause symptoms when they rub on clothing or jewelry.  Lesions can be in the way of shaving.  If they become inflamed, they can cause itching, soreness, or  burning.  Removal of a seborrheic keratosis can be accomplished by freezing, burning, or surgery. If any spot bleeds, scabs, or grows rapidly, please return to have it checked, as these can be an indication of a skin cancer.    Melanoma ABCDEs  Melanoma is the most dangerous type of skin cancer, and is the leading cause of death from skin disease.  You are more likely to develop melanoma if you: Have light-colored skin, light-colored eyes, or red or blond hair Spend a lot of time in the sun Tan regularly, either outdoors or in a tanning bed Have had blistering sunburns, especially during childhood Have a close family member who has had a melanoma Have atypical moles or large birthmarks  Early detection of melanoma is key since treatment is typically straightforward and cure rates are extremely high if we catch it early.   The first sign of melanoma is often a change in a mole or a new dark spot.  The ABCDE system is a way of remembering the signs of melanoma.  A for asymmetry:  The two halves do not match. B for border:  The edges of the growth are irregular. C for color:  A mixture of colors are present instead of an even brown color. D for diameter:  Melanomas are usually (but not always) greater than 6mm - the size of a pencil eraser. E for evolution:  The spot keeps changing in size, shape, and color.  Please check your skin once per month between visits. You can use a  small mirror in front and a large mirror behind you to keep an eye on the back side or your body.   If you see any new or changing lesions before your next follow-up, please call to schedule a visit.  Please continue daily skin protection including broad spectrum sunscreen SPF 30+ to sun-exposed areas, reapplying every 2 hours as needed when you're outdoors.   Staying in the shade or wearing long sleeves, sun glasses (UVA+UVB protection) and wide brim hats (4-inch brim around the entire circumference of the hat) are  also recommended for sun protection.    Due to recent changes in healthcare laws, you may see results of your pathology and/or laboratory studies on MyChart before the doctors have had a chance to review them. We understand that in some cases there may be results that are confusing or concerning to you. Please understand that not all results are received at the same time and often the doctors may need to interpret multiple results in order to provide you with the best plan of care or course of treatment. Therefore, we ask that you please give [place] 2 business days to thoroughly review all your results before contacting the office for clarification. Should we see a critical lab result, you will be contacted sooner.   If You Need Anything After Your Visit  If you have any questions or concerns for your doctor, please call our main line at [phone] and press option 4 to reach your doctor's medical assistant. If no one answers, please leave a voicemail as directed and we will return your call as soon as possible. Messages left after 4 pm will be answered the following business day.   You may also send [place] a message via MyChart. We typically respond to MyChart messages within 1-2 business days.  For prescription refills, please ask your pharmacy to contact our office. Our fax number is [phone].  If you have an urgent issue when the clinic is closed that cannot wait until the next business day, you can page your doctor at the number below.    Please note that while we do our best to be available for urgent issues outside of office hours, we are not available 24/7.   If you have an urgent issue and are unable to reach [place], you may choose to seek medical care at your doctor's office, retail clinic, urgent care center, or emergency room.  If you have a medical emergency, please immediately call 911 or go to the emergency department.  Pager Numbers  - Dr. Gwen Pounds: [phone]  - Dr. Roseanne Reno:  [phone]  - Dr. Katrinka Blazing: [phone]   In the event of inclement weather, please call our main line at [phone] for an update on the status of any delays or closures.  Dermatology Medication Tips: Please keep the boxes that topical medications come in in order to help keep track of the instructions about where and how to use these. Pharmacies typically print the medication instructions only on the boxes and not directly on the medication tubes.   If your medication is too expensive, please contact our office at [phone] option 4 or send [place] a message through MyChart.   We are unable to tell what your co-pay for medications will be in advance as this is different depending on your insurance coverage. However, we may be able to find a substitute medication at lower cost or fill out paperwork to get insurance to cover a needed medication.   If a  prior authorization is required to get your medication covered by your insurance company, please allow [place] 1-2 business days to complete this process.  Drug prices often vary depending on where the prescription is filled and some pharmacies may offer cheaper prices.  The website www.goodrx.com contains coupons for medications through different pharmacies. The prices here do not account for what the cost may be with help from insurance (it may be cheaper with your insurance), but the website can give you the price if you did not use any insurance.  - You can print the associated coupon and take it with your prescription to the pharmacy.  - You may also stop by our office during regular business hours and pick up a GoodRx coupon card.  - If you need your prescription sent electronically to a different pharmacy, notify our office through [facility] or by phone at [phone] option 4.     Si Usted Necesita Algo Despus de Su Visita  Tambin puede enviarnos un mensaje a travs de Clinical cytogeneticist. Por lo general respondemos a los mensajes de  MyChart en el transcurso de 1 a 2 das hbiles.  Para renovar recetas, por favor pida a su farmacia que se ponga en contacto con nuestra oficina. Annie Sable de fax es [place] [phone].  Si tiene un asunto urgente cuando la clnica est cerrada y que no puede esperar hasta el siguiente da hbil, puede llamar/localizar a su doctor(a) al nmero que aparece a continuacin.   Por favor, tenga en cuenta que aunque hacemos todo lo posible para estar disponibles para asuntos urgentes fuera del horario de [place], no estamos disponibles las 24 horas del da, los 7 [address]  [address] de la [place].   Si tiene un problema urgente y no puede comunicarse con nosotros, puede optar por buscar atencin mdica  en el consultorio de su doctor(a), en una clnica privada, en un centro de atencin urgente o en una sala de emergencias.  Si tiene Engineer, drilling, por favor llame inmediatamente al 911 o vaya a la sala de emergencias.  Nmeros de bper  - Dr. Gwen Pounds: [phone]  - Dra. Roseanne Reno: [phone]  - Dr. Katrinka Blazing: [phone]   En caso de inclemencias del tiempo, por favor llame a Lacy Duverney principal al [phone] para una actualizacin sobre el [place] de cualquier retraso o cierre.  Consejos para la medicacin en dermatologa: Por favor, guarde las cajas en las que vienen los medicamentos de uso tpico para ayudarle a seguir las instrucciones sobre dnde y cmo usarlos. Las farmacias generalmente imprimen las instrucciones del medicamento slo en las cajas y no directamente en los tubos del [place].   Si su medicamento es muy caro, por favor, pngase en contacto con Rolm Gala llamando al [phone] y presione la opcin 4 o envenos un mensaje a travs de Clinical cytogeneticist.   No podemos decirle cul ser su copago por los medicamentos por adelantado ya que esto es diferente dependiendo de la cobertura de su seguro. Sin embargo, es posible que podamos encontrar un medicamento sustituto a Advice worker un formulario para que el seguro cubra el medicamento que se considera necesario.   Si se requiere una autorizacin previa para que su compaa de seguros [place] su medicamento, por favor permtanos de 1 a 2 das hbiles para completar [address]  Los precios de los medicamentos varan con frecuencia dependiendo del Environmental consultant de dnde se surte la receta y alguna farmacias pueden ofrecer precios ms baratos.  El sitio web www.goodrx.com tiene  cupones para medicamentos de Health and safety inspector. Los precios aqu no tienen en cuenta lo que podra costar con la ayuda del seguro (puede ser ms barato con su seguro), pero el sitio web puede darle el precio si no utiliz Tourist information centre manager.  - Puede imprimir el cupn correspondiente y llevarlo con su receta a la farmacia.  - Tambin puede pasar por nuestra oficina durante el horario de atencin regular y Education officer, museum una tarjeta de cupones de GoodRx.  - Si necesita que su receta se enve electrnicamente a una farmacia diferente, informe a nuestra oficina a travs de MyChart de [place] o por telfono llamando al [phone] y presione la opcin 4.

## 2023-12-19 DIAGNOSIS — M9903 Segmental and somatic dysfunction of lumbar region: Secondary | ICD-10-CM | POA: Diagnosis not present

## 2023-12-19 DIAGNOSIS — M9905 Segmental and somatic dysfunction of pelvic region: Secondary | ICD-10-CM | POA: Diagnosis not present

## 2023-12-19 DIAGNOSIS — M5417 Radiculopathy, lumbosacral region: Secondary | ICD-10-CM | POA: Diagnosis not present

## 2023-12-19 DIAGNOSIS — M5416 Radiculopathy, lumbar region: Secondary | ICD-10-CM | POA: Diagnosis not present

## 2023-12-21 DIAGNOSIS — M5416 Radiculopathy, lumbar region: Secondary | ICD-10-CM | POA: Diagnosis not present

## 2023-12-21 DIAGNOSIS — M9905 Segmental and somatic dysfunction of pelvic region: Secondary | ICD-10-CM | POA: Diagnosis not present

## 2023-12-21 DIAGNOSIS — M9903 Segmental and somatic dysfunction of lumbar region: Secondary | ICD-10-CM | POA: Diagnosis not present

## 2023-12-21 DIAGNOSIS — M5417 Radiculopathy, lumbosacral region: Secondary | ICD-10-CM | POA: Diagnosis not present

## 2023-12-24 DIAGNOSIS — M9905 Segmental and somatic dysfunction of pelvic region: Secondary | ICD-10-CM | POA: Diagnosis not present

## 2023-12-24 DIAGNOSIS — M5417 Radiculopathy, lumbosacral region: Secondary | ICD-10-CM | POA: Diagnosis not present

## 2023-12-24 DIAGNOSIS — M5416 Radiculopathy, lumbar region: Secondary | ICD-10-CM | POA: Diagnosis not present

## 2023-12-24 DIAGNOSIS — M9903 Segmental and somatic dysfunction of lumbar region: Secondary | ICD-10-CM | POA: Diagnosis not present

## 2023-12-28 DIAGNOSIS — M9905 Segmental and somatic dysfunction of pelvic region: Secondary | ICD-10-CM | POA: Diagnosis not present

## 2023-12-28 DIAGNOSIS — M5416 Radiculopathy, lumbar region: Secondary | ICD-10-CM | POA: Diagnosis not present

## 2023-12-28 DIAGNOSIS — M9903 Segmental and somatic dysfunction of lumbar region: Secondary | ICD-10-CM | POA: Diagnosis not present

## 2023-12-28 DIAGNOSIS — M5417 Radiculopathy, lumbosacral region: Secondary | ICD-10-CM | POA: Diagnosis not present

## 2024-01-18 DIAGNOSIS — M5416 Radiculopathy, lumbar region: Secondary | ICD-10-CM | POA: Diagnosis not present

## 2024-01-18 DIAGNOSIS — M9905 Segmental and somatic dysfunction of pelvic region: Secondary | ICD-10-CM | POA: Diagnosis not present

## 2024-01-18 DIAGNOSIS — M9903 Segmental and somatic dysfunction of lumbar region: Secondary | ICD-10-CM | POA: Diagnosis not present

## 2024-01-18 DIAGNOSIS — M5417 Radiculopathy, lumbosacral region: Secondary | ICD-10-CM | POA: Diagnosis not present

## 2024-02-03 DIAGNOSIS — G4733 Obstructive sleep apnea (adult) (pediatric): Secondary | ICD-10-CM | POA: Diagnosis not present

## 2024-03-12 DIAGNOSIS — M25542 Pain in joints of left hand: Secondary | ICD-10-CM | POA: Diagnosis not present

## 2024-03-12 DIAGNOSIS — M150 Primary generalized (osteo)arthritis: Secondary | ICD-10-CM | POA: Diagnosis not present

## 2024-03-12 DIAGNOSIS — M1120 Other chondrocalcinosis, unspecified site: Secondary | ICD-10-CM | POA: Diagnosis not present

## 2024-03-12 DIAGNOSIS — Z7960 Long term (current) use of unspecified immunomodulators and immunosuppressants: Secondary | ICD-10-CM | POA: Diagnosis not present

## 2024-04-23 ENCOUNTER — Other Ambulatory Visit

## 2024-04-23 ENCOUNTER — Emergency Department

## 2024-04-23 ENCOUNTER — Other Ambulatory Visit: Payer: Self-pay

## 2024-04-23 ENCOUNTER — Emergency Department
Admission: EM | Admit: 2024-04-23 | Discharge: 2024-04-23 | Disposition: A | Discharge status: Home / Self Care | Source: Ambulatory Visit | Attending: Emergency Medicine | Admitting: Emergency Medicine

## 2024-04-23 DIAGNOSIS — W28XXXA Contact with powered lawn mower, initial encounter: Secondary | ICD-10-CM | POA: Insufficient documentation

## 2024-04-23 DIAGNOSIS — J9811 Atelectasis: Secondary | ICD-10-CM | POA: Diagnosis not present

## 2024-04-23 DIAGNOSIS — I10 Essential (primary) hypertension: Secondary | ICD-10-CM | POA: Diagnosis not present

## 2024-04-23 DIAGNOSIS — S299XXA Unspecified injury of thorax, initial encounter: Secondary | ICD-10-CM | POA: Diagnosis present

## 2024-04-23 DIAGNOSIS — R079 Chest pain, unspecified: Secondary | ICD-10-CM | POA: Diagnosis not present

## 2024-04-23 DIAGNOSIS — Z96652 Presence of left artificial knee joint: Secondary | ICD-10-CM | POA: Diagnosis not present

## 2024-04-23 DIAGNOSIS — S2232XA Fracture of one rib, left side, initial encounter for closed fracture: Secondary | ICD-10-CM | POA: Insufficient documentation

## 2024-04-23 DIAGNOSIS — R0789 Other chest pain: Secondary | ICD-10-CM | POA: Diagnosis not present

## 2024-04-23 DIAGNOSIS — M25562 Pain in left knee: Secondary | ICD-10-CM | POA: Diagnosis not present

## 2024-04-23 DIAGNOSIS — I700 Atherosclerosis of aorta: Secondary | ICD-10-CM | POA: Diagnosis not present

## 2024-04-23 MED ORDER — NAPROXEN 500 MG PO TABS
500.0000 mg | ORAL_TABLET | Freq: Once | ORAL | Status: AC
  Administered 2024-04-23: 500 mg via ORAL
  Filled 2024-04-23: qty 1

## 2024-04-23 MED ORDER — OXYCODONE HCL 5 MG PO TABS
5.0000 mg | ORAL_TABLET | Freq: Once | ORAL | Status: AC
  Administered 2024-04-23: 5 mg via ORAL
  Filled 2024-04-23: qty 1

## 2024-04-23 MED ORDER — LIDOCAINE 5 % EX PTCH
1.0000 | MEDICATED_PATCH | Freq: Two times a day (BID) | CUTANEOUS | 1 refills | Status: AC
  Filled 2024-04-23: qty 10, 5d supply, fill #0

## 2024-04-23 MED ORDER — ACETAMINOPHEN 500 MG PO TABS
1000.0000 mg | ORAL_TABLET | Freq: Once | ORAL | Status: AC
  Administered 2024-04-23: 1000 mg via ORAL
  Filled 2024-04-23: qty 2

## 2024-04-23 MED ORDER — LIDOCAINE 5 % EX PTCH
2.0000 | MEDICATED_PATCH | CUTANEOUS | Status: DC
  Administered 2024-04-23: 2 via TRANSDERMAL
  Filled 2024-04-23: qty 2

## 2024-04-23 MED ORDER — OXYCODONE HCL 5 MG PO TABS
5.0000 mg | ORAL_TABLET | Freq: Three times a day (TID) | ORAL | 0 refills | Status: AC | PRN
  Filled 2024-04-23: qty 15, 5d supply, fill #0

## 2024-04-23 NOTE — ED Provider Notes (Signed)
 [facility] Provider Note    Event Date/Time   First MD Initiated Contact with Patient 04/23/24 2033     (approximate)   History   Chest Injury   HPI  Jerry Welch is a 73 y.o. male who presents to the ED for evaluation of Chest Injury   Reviewed PCP visit from February.  Patient HTN, HLD.  Patient presents with chest pain after his riding lawnmower accidentally flipped and landing on his left side.  Occurred just this afternoon when he was using an old lawnmower to mow a steep embankment behind the house.  Rolling over the mower, no head injury or syncope, reports scrapes to his left lower leg but primarily has pain to his anterior and left-sided chest.  No preceding illnesses or chest pain   Physical Exam   Triage Vital Signs: ED Triage Vitals  Encounter Vitals Group     BP 04/23/24 1749 (!) 146/101     Girls Systolic BP Percentile --      Girls Diastolic BP Percentile --      Boys Systolic BP Percentile --      Boys Diastolic BP Percentile --      Pulse Rate 04/23/24 1749 80     Resp 04/23/24 1749 18     Temp 04/23/24 1749 98 F (36.7 C)     Temp Source 04/23/24 1749 Oral     SpO2 04/23/24 1749 99 %     Weight --      Height --      Head Circumference --      Peak Flow --      Pain Score 04/23/24 1748 7     Pain Loc --      Pain Education --      Exclude from Growth Chart --     Most recent vital signs: Vitals:   04/23/24 2036 04/23/24 2100  BP: (!) 156/88 (!) 144/89  Pulse: 75 70  Resp: (!) 22 15  Temp:    SpO2: 94% 94%    General: Awake, no distress.  CV:  Good peripheral perfusion.  Resp:  Normal effort.  Abd:  No distention.  Soft and benign abdomen without tenderness MSK:  No deformity noted.  Couple small scrapes and abrasions to the lateral aspect of the left lower leg.  Full active and passive range of motion to bilateral lower extremities.  No significant deformity No signs of trauma to the bilateral upper  extremities No significant tenderness to palpation throughout the anterior and lateral left-sided chest wall, no LUQ abdominal tenderness. With minimal assistance, able to sit forward so I can examine his back, no spinal or back tenderness.  No neck or occipital tenderness Neuro:  No focal deficits appreciated. Other:     ED Results / Procedures / Treatments   Labs (all labs ordered are listed, but only abnormal results are displayed) Labs Reviewed - No data to display  EKG Sinus rhythm with a rate of 71 bpm.  Normal axis and intervals without clear signs of acute ischemia.  RADIOLOGY CXR interpreted by me without evidence of acute cardiopulmonary pathology. Plain film of the left knee interpreted by me without clear signs of acute pathology.  Official radiology report(s): CT Chest Wo Contrast Result Date: 04/23/2024 CLINICAL DATA:  Rollover lawnmower accident with left-sided chest pain, initial encounter EXAM: CT CHEST WITHOUT CONTRAST TECHNIQUE: Multidetector CT imaging of the chest was performed following the standard protocol without IV contrast. RADIATION  DOSE REDUCTION: This exam was performed according to the departmental dose-optimization program which includes automated exposure control, adjustment of the mA and/or kV according to patient size and/or use of iterative reconstruction technique. COMPARISON:  Chest x-ray from earlier in the same day. FINDINGS: Cardiovascular: Somewhat limited due to lack of IV contrast. Atherosclerotic calcifications of the aorta are noted. Heart is not significantly enlarged in size. Coronary calcifications are noted. The pulmonary artery is not enlarged. Mediastinum/Nodes: Thoracic inlet is within normal limits. No hilar or mediastinal adenopathy is noted. The esophagus as visualized is within normal limits. Lungs/Pleura: The lungs are well aerated bilaterally. No focal infiltrate or sizable effusion is seen. No parenchymal nodule is noted. Mild  atelectatic changes are noted in the bases bilaterally. Upper Abdomen: No acute abnormality. Musculoskeletal: Minimally displaced left fifth rib fracture is noted. No pneumothorax is noted. IMPRESSION: Mildly displaced left fifth rib fracture without complicating factors. Mild bibasilar atelectasis is noted. Electronically Signed   By: Oneil Devonshire M.D.   On: 04/23/2024 21:15   DG Knee Complete 4 Views Left Result Date: 04/23/2024 CLINICAL DATA:  Knee pain, lawnmower accident. EXAM: LEFT KNEE - COMPLETE 4+ VIEW COMPARISON:  09/23/2008 FINDINGS: Prior left knee replacement. No hardware complicating feature. No joint effusion. No acute bony abnormality. Specifically, no fracture, subluxation, or dislocation. IMPRESSION: Left knee replacement.  No visible complicating feature. No acute bony abnormality. Electronically Signed   By: Franky Crease M.D.   On: 04/23/2024 18:17   DG Chest 2 View Result Date: 04/23/2024 CLINICAL DATA:  Chest pain, lawnmower accident EXAM: CHEST - 2 VIEW COMPARISON:  07/01/2021 FINDINGS: The heart size and mediastinal contours are within normal limits. Both lungs are clear. The visualized skeletal structures are unremarkable. No pneumothorax. IMPRESSION: No active cardiopulmonary disease. Electronically Signed   By: Franky Crease M.D.   On: 04/23/2024 18:16    PROCEDURES and INTERVENTIONS:  .1-3 Lead EKG Interpretation  Performed by: Claudene Rover, MD Authorized by: Claudene Rover, MD     Interpretation: normal     ECG rate:  70   ECG rate assessment: normal     Rhythm: sinus rhythm     Ectopy: none     Conduction: normal     Medications  lidocaine  (LIDODERM ) 5 % 2 patch (2 patches Transdermal Patch Applied 04/23/24 2101)  acetaminophen  (TYLENOL ) tablet 1,000 mg (1,000 mg Oral Given 04/23/24 2101)  oxyCODONE  (Oxy IR/ROXICODONE ) immediate release tablet 5 mg (5 mg Oral Given 04/23/24 2101)  naproxen  (NAPROSYN ) tablet 500 mg (500 mg Oral Given 04/23/24 2101)     IMPRESSION  / MDM / ASSESSMENT AND PLAN / ED COURSE  I reviewed the triage vital signs and the nursing notes.  Differential diagnosis includes, but is not limited to, rib fracture, pneumothorax, chest wall contusion, solid organ abdominal injury, hardware displacement from his knee replacement  {Patient presents with symptoms of an acute illness or injury that is potentially life-threatening.  Patient presents after accidental rollover lawnmower injury who with a singular rib fracture, superficial abrasions and ultimately suitable for outpatient management.  Localized tenderness to his left lateral ribs, some superficial abrasions but otherwise looks okay.  Plain film imaging is reassuring but high suspicion for rib fracture so we go to CT and this does confirm a singular left-sided rib fracture.  His pain is controlled, he saturating well on room air and suitable for outpatient management though I did consider admission for this patient.  Clinical Course as of 04/23/24 2137  Wed  Apr 23, 2024  2133 Reassessed and reexamined.  Discussed rib fracture, management, pneumonia prevention, how to use incentive spirometer, wound care.  Answered questions.  They are appreciative and patient is suitable for outpatient manage [DS]    Clinical Course User Index [DS] Claudene Rover, MD     FINAL CLINICAL IMPRESSION(S) / ED DIAGNOSES   Final diagnoses:  Closed fracture of one rib of left side, initial encounter     Rx / DC Orders   ED Discharge Orders          Ordered    lidocaine  (LIDODERM ) 5 %  Every 12 hours        04/23/24 2136    oxyCODONE  (ROXICODONE ) 5 MG immediate release tablet  Every 8 hours PRN        04/23/24 2136             Note:  This document was prepared using Dragon voice recognition software and may include unintentional dictation errors.   Claudene Rover, MD 04/23/24 2138

## 2024-04-23 NOTE — ED Triage Notes (Signed)
 Patient sent over from [facility]; was riding lawnmower when it flipped over on top of his chest. Patient complaining of mid sternal chest pain.

## 2024-04-23 NOTE — Discharge Instructions (Signed)
 Use Tylenol  for pain and fevers.  Up to 1000 mg per dose, up to 4 times per day.  Do not take more than 4000 mg of Tylenol /acetaminophen  within 24 hours..  Use naproxen /Aleve  for anti-inflammatory pain relief. Use up to 500mg  every 12 hours. Do not take more frequently than this. Do not use other NSAIDs (ibuprofen , Advil ) while taking this medication. It is safe to take Tylenol  with this.   Please use lidocaine  patches at your site of pain.  Apply 1 patch at a time, leave on for 12 hours, then remove for 12 hours.  12 hours on, 12 hours off.  Do not apply more than 1 patch at a time.  Oxycodone  as needed for more severe/breakthrough pain or to help with sleep.

## 2024-04-23 NOTE — ED Notes (Signed)
 Patient taken for CT at this time

## 2024-04-23 NOTE — ED Notes (Signed)
 Patient given discharge instructions including prescriptions x3 and importance of follow up appt as needed with stated understanding. Patient and wife instructed on use of IS for pulmonary toilet. Patient wheeled out to car on dispo with safe ride on dispo.

## 2024-04-24 ENCOUNTER — Other Ambulatory Visit: Payer: Self-pay

## 2024-05-07 ENCOUNTER — Other Ambulatory Visit: Payer: Self-pay | Admitting: Family Medicine

## 2024-05-07 ENCOUNTER — Ambulatory Visit
Admission: RE | Admit: 2024-05-07 | Discharge: 2024-05-07 | Disposition: A | Discharge status: Home / Self Care | Source: Ambulatory Visit | Attending: Family Medicine | Admitting: Family Medicine

## 2024-05-07 ENCOUNTER — Other Ambulatory Visit: Payer: Self-pay

## 2024-05-07 DIAGNOSIS — L02416 Cutaneous abscess of left lower limb: Secondary | ICD-10-CM | POA: Diagnosis not present

## 2024-05-07 DIAGNOSIS — M7989 Other specified soft tissue disorders: Secondary | ICD-10-CM | POA: Diagnosis not present

## 2024-05-07 DIAGNOSIS — L03116 Cellulitis of left lower limb: Secondary | ICD-10-CM | POA: Diagnosis not present

## 2024-05-07 MED ORDER — DOXYCYCLINE HYCLATE 100 MG PO CAPS
100.0000 mg | ORAL_CAPSULE | Freq: Two times a day (BID) | ORAL | 0 refills | Status: AC
  Filled 2024-05-07: qty 20, 10d supply, fill #0

## 2024-05-07 MED ORDER — PREDNISONE 20 MG PO TABS
ORAL_TABLET | ORAL | 0 refills | Status: AC
  Filled 2024-05-07 (×2): qty 15, 10d supply, fill #0

## 2024-05-07 NOTE — Progress Notes (Signed)
 05/08/2024 10:03 AM   Jerry Welch 01/31/1951 [id number]  Referring provider: Valora Agent, MD [address] [place],  [place] [address]  Urological history: 1. Prostate cancer -PSA (11/2023) <0.1 -robotic prostatectomy with Dr. Penne in 06/2017.  Surgical pathology consistent with Gleason 3+4.  There was evidence of extraprostatic extension.  Negative margins.  Negative lymph nodes.  Negative seminal vesicles. pT3a N0.  2. High risk hematuria -former smoker -CTU 09/2019 3 mm left renal calculus. No evidence of ureteral calculi or hydronephrosis.  No radiographic evidence of urinary tract neoplasm.  5.6 cm well-circumscribed fluid collection in the Space of [place], with linear soft tissue density extending to the umbilicus.  Differential diagnosis includes urachal cyst or postop fluid collection.  Colonic diverticulosis. No radiographic evidence of diverticulitis.  Mild hepatic steatosis -Cysto unremarkable with Dr. Penne in 09/2019  3. SUI -contributing factors of age, pelvic surgery, sleep apnea, obesity and diuretic  Chief Complaint  Patient presents with   Prostate Cancer   HPI: Jerry Welch is a 73 y.o. male who presents today for urinary leakage  Previous records reviewed.     He has some mild urinary leakage that he does not feel bothered by and is managing with pads.   He states he made this appointment to appease family members.  Patient denies any modifying or aggravating factors.  Patient denies any recent UTI's, gross hematuria, dysuria or suprapubic/flank pain.  Patient denies any fevers, chills, nausea or vomiting.     PMH: Past Medical History:  Diagnosis Date   Back pain    Basal cell carcinoma 05/17/2020   Right top of shoulder. Nodular pattern. EDC   Basal cell carcinoma 05/17/2020   Left inferior scapula. Nodular pattern. [facility] 07/27/20   Basal cell carcinoma 02/17/2021   L neck post auricular - ED&C    Dysplastic  nevus 05/17/2020   Left mid to upper back 6cm lat to spine. Mild atypia, limited margins free.    Dysplastic nevus 08/10/2020   left pubic area, mild atypia, limited margins free   GERD (gastroesophageal reflux disease)    History of hiatal hernia    HLD (hyperlipidemia) 04/30/2015   Hypertension    Melanoma (HCC) 05/17/2020   Mid to upper back, left paraspinal. MMIS, lateral margin involved. exc 07/20/20, margins free.   Osteoarthrosis involving multiple sites    Prostate cancer (HCC) 2019   prostatectomy   Ruptured lumbar disc    L4 & L5   Sleep apnea     Surgical History: Past Surgical History:  Procedure Laterality Date   APPENDECTOMY     BACK SURGERY  09/25/2013   L4-L5    BASAL CELL CARCINOMA EXCISION  09/25/2010   right neck    PELVIC LYMPH NODE DISSECTION Bilateral 07/09/2017   Procedure: PELVIC LYMPH NODE DISSECTION;  Surgeon: Penne Knee, MD;  Location: [facility] ORS;  Service: Urology;  Laterality: Bilateral;   PROSTATE BIOPSY     REPLACEMENT TOTAL KNEE  09/25/2000   left knee    REPLACEMENT TOTAL KNEE     right x 2   ROBOT ASSISTED LAPAROSCOPIC RADICAL PROSTATECTOMY N/A 07/09/2017   Procedure: ROBOTIC ASSISTED LAPAROSCOPIC RADICAL PROSTATECTOMY;  Surgeon: Penne Knee, MD;  Location: [facility] ORS;  Service: Urology;  Laterality: N/A;    Home Medications:  Allergies as of 05/08/2024       Reactions   Amlodipine Other (See Comments)   LE EDEMA   Lisinopril    Cough-onset  Medication List        Accurate as of May 08, 2024 10:03 AM. If you have any questions, ask your nurse or doctor.          atorvastatin 40 MG tablet Commonly known as: LIPITOR Take 40 mg by mouth daily.   cyanocobalamin 1000 MCG/ML injection Commonly known as: VITAMIN B12 Inject 1,000 mcg into the muscle every 14 (fourteen) days.   diclofenac  Sodium 1 % Gel Commonly known as: VOLTAREN  Apply 2 g topically 4 (four) times daily.   doxazosin  2 MG tablet Commonly  known as: Cardura  Take 1 tablet (2 mg total) by mouth 2 (two) times daily.   doxycycline  100 MG capsule Commonly known as: VIBRAMYCIN  Take 1 capsule (100 mg total) by mouth 2 (two) times daily for 10 days   fluticasone 50 MCG/ACT nasal spray Commonly known as: FLONASE Place 1 spray into both nostrils 2 (two) times daily.   hydroxychloroquine 200 MG tablet Commonly known as: PLAQUENIL Take 1 tablet by mouth 2 (two) times daily.   ketoconazole  2 % cream Commonly known as: NIZORAL  Apply topically to both feet (entire foot and toes) daily at bedtime   latanoprost 0.005 % ophthalmic solution Commonly known as: XALATAN Place 1 drop into both eyes at bedtime.   lidocaine  5 % Commonly known as: Lidoderm  Place 1 patch onto the skin every 12 (twelve) hours. Remove & Discard patch within 12 hours or as directed by MD   loratadine 10 MG tablet Commonly known as: CLARITIN Take 10 mg by mouth daily.   meclizine  25 MG tablet Commonly known as: ANTIVERT  Take 1 tablet (25 mg total) by mouth 2 (two) times daily as needed for dizziness.   methotrexate 2.5 MG tablet Commonly known as: RHEUMATREX Take 15 mg by mouth once a week.   naproxen  sodium 220 MG tablet Commonly known as: ALEVE  Take 440 mg by mouth daily as needed (pain).   oxyCODONE  5 MG immediate release tablet Commonly known as: Roxicodone  Take 1 tablet (5 mg total) by mouth every 8 (eight) hours as needed.   potassium chloride  10 MEQ tablet Commonly known as: KLOR-CON  Take 10 mEq by mouth 3 (three) times daily.   predniSONE  20 MG tablet Commonly known as: DELTASONE  Take 1 tablet (20 mg total) by mouth 2 (two) times daily for 5 days Then take 1 tablet (20 mg total) by mouth once daily for 5 days Start taking on: May 07, 2024   valsartan -hydrochlorothiazide  320-25 MG tablet Commonly known as: DIOVAN -HCT Take 1 tablet by mouth daily.   verapamil  240 MG CR tablet Commonly known as: CALAN -SR Take 240 mg by mouth 2  (two) times daily.        Allergies:  Allergies  Allergen Reactions   Amlodipine Other (See Comments)    LE EDEMA   Lisinopril     Cough-onset     Family History: Family History  Problem Relation Age of Onset   Heart failure Mother    Heart attack Maternal Grandfather    Bladder Cancer Neg Hx    Prostate cancer Neg Hx    Kidney cancer Neg Hx     Social History:  reports that he quit smoking about 42 years ago. His smoking use included cigarettes. He has quit using smokeless tobacco. He reports current alcohol use of about 1.0 standard drink of alcohol per week. He reports that he does not use drugs.  ROS: For pertinent review of systems please refer to history of present illness  Physical Exam: BP (!) 167/90 (BP Location: Left Arm, Patient Position: Sitting, Cuff Size: Large)   Pulse 74   Ht 5' 9 (1.753 m)   Wt 190 lb 11.2 oz (86.5 kg)   BMI 28.16 kg/m   Constitutional:  Well nourished. Alert and oriented, No acute distress. HEENT: [place] AT, moist mucus membranes.  Trachea midline Cardiovascular: No clubbing, cyanosis, or edema. Respiratory: Normal respiratory effort, no increased work of breathing. Neurologic: Grossly intact, no focal deficits, moving all 4 extremities. Psychiatric: Normal mood and affect.   Laboratory Data: See HPI and EPIC I have reviewed the labs.   Pertinent Imaging: N/A  Assessment & Plan:    1. Prostate cancer (HCC) -PSA undetectable - He would like to continue with his PCP for PSA monitoring and I find that reasonable as he aware that his PSA must remain undetectable and that any increase would need further evaluation with us  and he states he will reach out to us  if he notices an increase in his PSA  2. Stress incontinence, male - Minimal bother - Managing with a light incontinence pad  3. High risk hematuria -work up in 2021 -NED -no reports of gross hematuria  - He would also like his PCP to continue to monitor his urine for  any return of microscopic hematuria and he will notify us  if he has any gross hematuria  Return if symptoms worsen or fail to improve.  CLOTILDA CORNWALL, PA-C  [facility] [address] [place], [place] [address] [phone]

## 2024-05-08 ENCOUNTER — Ambulatory Visit: Admitting: Urology

## 2024-05-08 ENCOUNTER — Encounter: Payer: Self-pay | Admitting: Urology

## 2024-05-08 VITALS — BP 167/90 | HR 74 | Ht 69.0 in | Wt 190.7 lb

## 2024-05-08 DIAGNOSIS — N393 Stress incontinence (female) (male): Secondary | ICD-10-CM

## 2024-05-08 DIAGNOSIS — C61 Malignant neoplasm of prostate: Secondary | ICD-10-CM

## 2024-05-08 DIAGNOSIS — R319 Hematuria, unspecified: Secondary | ICD-10-CM | POA: Diagnosis not present

## 2024-06-24 ENCOUNTER — Ambulatory Visit: Admitting: Dermatology

## 2024-06-24 ENCOUNTER — Other Ambulatory Visit: Payer: Self-pay

## 2024-06-24 DIAGNOSIS — Z1283 Encounter for screening for malignant neoplasm of skin: Secondary | ICD-10-CM

## 2024-06-24 DIAGNOSIS — L820 Inflamed seborrheic keratosis: Secondary | ICD-10-CM | POA: Diagnosis not present

## 2024-06-24 DIAGNOSIS — Z7189 Other specified counseling: Secondary | ICD-10-CM

## 2024-06-24 DIAGNOSIS — L821 Other seborrheic keratosis: Secondary | ICD-10-CM

## 2024-06-24 DIAGNOSIS — Z79899 Other long term (current) drug therapy: Secondary | ICD-10-CM

## 2024-06-24 DIAGNOSIS — L578 Other skin changes due to chronic exposure to nonionizing radiation: Secondary | ICD-10-CM

## 2024-06-24 DIAGNOSIS — L814 Other melanin hyperpigmentation: Secondary | ICD-10-CM

## 2024-06-24 DIAGNOSIS — D1801 Hemangioma of skin and subcutaneous tissue: Secondary | ICD-10-CM | POA: Diagnosis not present

## 2024-06-24 DIAGNOSIS — L570 Actinic keratosis: Secondary | ICD-10-CM | POA: Diagnosis not present

## 2024-06-24 DIAGNOSIS — C44319 Basal cell carcinoma of skin of other parts of face: Secondary | ICD-10-CM

## 2024-06-24 DIAGNOSIS — Z85828 Personal history of other malignant neoplasm of skin: Secondary | ICD-10-CM

## 2024-06-24 DIAGNOSIS — W908XXA Exposure to other nonionizing radiation, initial encounter: Secondary | ICD-10-CM | POA: Diagnosis not present

## 2024-06-24 DIAGNOSIS — D229 Melanocytic nevi, unspecified: Secondary | ICD-10-CM

## 2024-06-24 DIAGNOSIS — C4491 Basal cell carcinoma of skin, unspecified: Secondary | ICD-10-CM

## 2024-06-24 DIAGNOSIS — B353 Tinea pedis: Secondary | ICD-10-CM | POA: Diagnosis not present

## 2024-06-24 DIAGNOSIS — D489 Neoplasm of uncertain behavior, unspecified: Secondary | ICD-10-CM | POA: Diagnosis not present

## 2024-06-24 DIAGNOSIS — Z86018 Personal history of other benign neoplasm: Secondary | ICD-10-CM

## 2024-06-24 DIAGNOSIS — Z85820 Personal history of malignant melanoma of skin: Secondary | ICD-10-CM

## 2024-06-24 HISTORY — DX: Basal cell carcinoma of skin, unspecified: C44.91

## 2024-06-24 MED ORDER — KETOCONAZOLE 2 % EX CREA
TOPICAL_CREAM | CUTANEOUS | 11 refills | Status: AC
  Filled 2024-06-24: qty 30, 30d supply, fill #0
  Filled 2024-06-24: qty 30, 15d supply, fill #0

## 2024-06-24 NOTE — Progress Notes (Unsigned)
 Follow-Up Visit   Subjective  Jerry Welch is a 73 y.o. male who presents for the following: Skin Cancer Screening and Full Body Skin Exam Hx of melanoma in situ, hx of bcc, hx of dysplastic nevi, hx of aks and isks, hx of tinea pedis  The patient presents for Total-Body Skin Exam (TBSE) for skin cancer screening and mole check. The patient has spots, moles and lesions to be evaluated, some may be new or changing and the patient may have concern these could be cancer.  The following portions of the chart were reviewed this encounter and updated as appropriate: medications, allergies, medical history  Review of Systems:  No other skin or systemic complaints except as noted in HPI or Assessment and Plan.  Objective  Well appearing patient in no apparent distress; mood and affect are within normal limits.  A full examination was performed including scalp, head, eyes, ears, nose, lips, neck, chest, axillae, abdomen, back, buttocks, bilateral upper extremities, bilateral lower extremities, hands, feet, fingers, toes, fingernails, and toenails. All findings within normal limits unless otherwise noted below.   Relevant physical exam findings are noted in the Assessment and Plan.  arms and hands x 21 (21) Erythematous thin papules/macules with gritty scale.  Left Shoulder - Anterior x 1, upper back spinal x 1 (2) Erythematous stuck-on, waxy papule or plaque Left Zygomatic Area 1.1 cm crusted papule    Assessment & Plan   TINEA PEDIS Exam: Scaling and maceration web spaces and over distal and lateral soles. Chronic and persistent condition with duration or expected duration over one year. Condition is symptomatic / bothersome to patient. Not to goal. Treatment Plan: Start ketoconazole  2 % cream - apply topically to both feet including toes nightly for a few months.    History of Basal Cell Carcinoma of the Skin 05/17/2020 right top of shoulder - nodular pattern [facility]  05/17/2020  Left inferior scapula - nodular pattern [facility] 07/27/2020 02/17/2021  left neck post auricular  ED&C  - No evidence of recurrence today - Recommend regular full body skin exams - Recommend daily broad spectrum sunscreen SPF 30+ to sun-exposed areas, reapply every 2 hours as needed.  - Call if any new or changing lesions are noted between office visits  History of Melanoma in Situ Mid to upper back, left paraspinal 04/2020 lateral margin involved . Excision 07/20/2020 margin free - No evidence of recurrence today - No lymphadenopathy - Recommend regular full body skin exams - Recommend daily broad spectrum sunscreen SPF 30+ to sun-exposed areas, reapply every 2 hours as needed.  - Call if any new or changing lesions are noted between office visits   History of Dysplastic Nevi 05/17/2020 Left mid to upper back 6 cm lateral to spine mild atypia limited margins free 08/10/2020 Left pubic area - mild atypia - limited margins free  - No evidence of recurrence today - Recommend regular full body skin exams - Recommend daily broad spectrum sunscreen SPF 30+ to sun-exposed areas, reapply every 2 hours as needed.  - Call if any new or changing lesions are noted between office visits  SKIN CANCER SCREENING PERFORMED TODAY.  ACTINIC DAMAGE - Chronic condition, secondary to cumulative UV/sun exposure - diffuse scaly erythematous macules with underlying dyspigmentation - Recommend daily broad spectrum sunscreen SPF 30+ to sun-exposed areas, reapply every 2 hours as needed.  - Staying in the shade or wearing long sleeves, sun glasses (UVA+UVB protection) and wide brim hats (4-inch brim around the entire circumference  of the hat) are also recommended for sun protection.  - Call for new or changing lesions.  LENTIGINES, SEBORRHEIC KERATOSES, HEMANGIOMAS - Benign normal skin lesions - Benign-appearing - Call for any changes  MELANOCYTIC NEVI - Tan-brown and/or pink-flesh-colored symmetric macules and  papules - Benign appearing on exam today - Observation - Call clinic for new or changing moles - Recommend daily use of broad spectrum spf 30+ sunscreen to sun-exposed areas.    ACTINIC KERATOSIS (21) arms and hands x 21 (21) Actinic keratoses are precancerous spots that appear secondary to cumulative UV radiation exposure/sun exposure over time. They are chronic with expected duration over 1 year. A portion of actinic keratoses will progress to squamous cell carcinoma of the skin. It is not possible to reliably predict which spots will progress to skin cancer and so treatment is recommended to prevent development of skin cancer.  Recommend daily broad spectrum sunscreen SPF 30+ to sun-exposed areas, reapply every 2 hours as needed.  Recommend staying in the shade or wearing long sleeves, sun glasses (UVA+UVB protection) and wide brim hats (4-inch brim around the entire circumference of the hat). Call for new or changing lesions. Destruction of lesion - arms and hands x 21 (21) Complexity: simple   Destruction method: cryotherapy   Informed consent: discussed and consent obtained   Timeout:  patient name, date of birth, surgical site, and procedure verified Lesion destroyed using liquid nitrogen: Yes   Region frozen until ice ball extended beyond lesion: Yes   Outcome: patient tolerated procedure well with no complications   Post-procedure details: wound care instructions given    INFLAMED SEBORRHEIC KERATOSIS (2) Left Shoulder - Anterior x 1, upper back spinal x 1 (2) Symptomatic, irritating, patient would like treated. Destruction of lesion - Left Shoulder - Anterior x 1, upper back spinal x 1 (2) Complexity: simple   Destruction method: cryotherapy   Informed consent: discussed and consent obtained   Timeout:  patient name, date of birth, surgical site, and procedure verified Lesion destroyed using liquid nitrogen: Yes   Region frozen until ice ball extended beyond lesion: Yes    Outcome: patient tolerated procedure well with no complications   Post-procedure details: wound care instructions given    NEOPLASM OF UNCERTAIN BEHAVIOR Left Zygomatic Area Epidermal / dermal shaving  Lesion diameter (cm):  1.1 Informed consent: discussed and consent obtained   Timeout: patient name, date of birth, surgical site, and procedure verified   Procedure prep:  Patient was prepped and draped in usual sterile fashion Prep type:  Isopropyl alcohol Anesthesia: the lesion was anesthetized in a standard fashion   Anesthetic:  1% lidocaine  w/ epinephrine 1-100,000 buffered w/ 8.4% NaHCO3 Instrument used: flexible razor blade   Hemostasis achieved with: pressure, aluminum chloride and electrodesiccation   Outcome: patient tolerated procedure well   Post-procedure details: sterile dressing applied and wound care instructions given   Dressing type: bandage and petrolatum    Destruction of lesion Complexity: extensive   Destruction method: electrodesiccation and curettage   Informed consent: discussed and consent obtained   Timeout:  patient name, date of birth, surgical site, and procedure verified Procedure prep:  Patient was prepped and draped in usual sterile fashion Prep type:  Isopropyl alcohol Anesthesia: the lesion was anesthetized in a standard fashion   Anesthetic:  1% lidocaine  w/ epinephrine 1-100,000 buffered w/ 8.4% NaHCO3 Curettage performed in three different directions: Yes   Electrodesiccation performed over the curetted area: Yes   Final wound size (cm):  1.1 Hemostasis achieved with:  pressure, aluminum chloride and electrodesiccation Outcome: patient tolerated procedure well with no complications   Post-procedure details: sterile dressing applied and wound care instructions given   Dressing type: bandage and petrolatum    Specimen 1 - Surgical pathology Differential Diagnosis: r/o bcc  ED&C today Check Margins: No R/o bcc  ED&C  TINEA PEDIS OF BOTH  FEET   Related Medications ketoconazole  (NIZORAL ) 2 % cream Apply topically to both feet (entire foot and toes) daily at bedtime for foot fungus Return in about 6 months (around 12/22/2024) for TBSE.  IEleanor Blush, CMA, am acting as scribe for Alm Rhyme, MD.   Documentation: I have reviewed the above documentation for accuracy and completeness, and I agree with the above.  Alm Rhyme, MD

## 2024-06-24 NOTE — Patient Instructions (Addendum)
 Biopsy Wound Care Instructions  Leave the original bandage on for 24 hours if possible.  If the bandage becomes soaked or soiled before that time, it is OK to remove it and examine the wound.  A small amount of post-operative bleeding is normal.  If excessive bleeding occurs, remove the bandage, place gauze over the site and apply continuous pressure (no peeking) over the area for 30 minutes. If this does not work, please call our clinic as soon as possible or page your doctor if it is after hours.   Once a day, cleanse the wound with soap and water. It is fine to shower. If a thick crust develops you may use a Q-tip dipped into dilute hydrogen peroxide (mix 1:1 with water) to dissolve it.  Hydrogen peroxide can slow the healing process, so use it only as needed.    After washing, apply petroleum jelly (Vaseline) or an antibiotic ointment if your doctor prescribed one for you, followed by a bandage.    For best healing, the wound should be covered with a layer of ointment at all times. If you are not able to keep the area covered with a bandage to hold the ointment in place, this may mean re-applying the ointment several times a day.  Continue this wound care until the wound has healed and is no longer open.   Itching and mild discomfort is normal during the healing process. However, if you develop pain or severe itching, please call our office.   If you have any discomfort, you can take Tylenol  (acetaminophen ) or ibuprofen  as directed on the bottle. (Please do not take these if you have an allergy to them or cannot take them for another reason).  Some redness, tenderness and white or yellow material in the wound is normal healing.  If the area becomes very sore and red, or develops a thick yellow-green material (pus), it may be infected; please notify us .    If you have stitches, return to clinic as directed to have the stitches removed. You will continue wound care for 2-3 days after the stitches  are removed.   Wound healing continues for up to one year following surgery. It is not unusual to experience pain in the scar from time to time during the interval.  If the pain becomes severe or the scar thickens, you should notify the office.    A slight amount of redness in a scar is expected for the first six months.  After six months, the redness will fade and the scar will soften and fade.  The color difference becomes less noticeable with time.  If there are any problems, return for a post-op surgery check at your earliest convenience.  To improve the appearance of the scar, you can use silicone scar gel, cream, or sheets (such as Mederma or Serica) every night for up to one year. These are available over the counter (without a prescription).  Please call our office at [phone] for any questions or concerns.    Actinic keratoses are precancerous spots that appear secondary to cumulative UV radiation exposure/sun exposure over time. They are chronic with expected duration over 1 year. A portion of actinic keratoses will progress to squamous cell carcinoma of the skin. It is not possible to reliably predict which spots will progress to skin cancer and so treatment is recommended to prevent development of skin cancer.  Recommend daily broad spectrum sunscreen SPF 30+ to sun-exposed areas, reapply every 2 hours as needed.  Recommend staying in the shade or wearing long sleeves, sun glasses (UVA+UVB protection) and wide brim hats (4-inch brim around the entire circumference of the hat). Call for new or changing lesions.    Cryotherapy Aftercare  Wash gently with soap and water everyday.   Apply Vaseline and Band-Aid daily until healed.     Seborrheic Keratosis  What causes seborrheic keratoses? Seborrheic keratoses are harmless, common skin growths that first appear during adult life.  As time goes by, more growths appear.  Some people may develop a large number of them.   Seborrheic keratoses appear on both covered and uncovered body parts.  They are not caused by sunlight.  The tendency to develop seborrheic keratoses can be inherited.  They vary in color from skin-colored to gray, brown, or even black.  They can be either smooth or have a rough, warty surface.   Seborrheic keratoses are superficial and look as if they were stuck on the skin.  Under the microscope this type of keratosis looks like layers upon layers of skin.  That is why at times the top layer may seem to fall off, but the rest of the growth remains and re-grows.    Treatment Seborrheic keratoses do not need to be treated, but can easily be removed in the office.  Seborrheic keratoses often cause symptoms when they rub on clothing or jewelry.  Lesions can be in the way of shaving.  If they become inflamed, they can cause itching, soreness, or burning.  Removal of a seborrheic keratosis can be accomplished by freezing, burning, or surgery. If any spot bleeds, scabs, or grows rapidly, please return to have it checked, as these can be an indication of a skin cancer.    Melanoma ABCDEs  Melanoma is the most dangerous type of skin cancer, and is the leading cause of death from skin disease.  You are more likely to develop melanoma if you: Have light-colored skin, light-colored eyes, or red or blond hair Spend a lot of time in the sun Tan regularly, either outdoors or in a tanning bed Have had blistering sunburns, especially during childhood Have a close family member who has had a melanoma Have atypical moles or large birthmarks  Early detection of melanoma is key since treatment is typically straightforward and cure rates are extremely high if we catch it early.   The first sign of melanoma is often a change in a mole or a new dark spot.  The ABCDE system is a way of remembering the signs of melanoma.  A for asymmetry:  The two halves do not match. B for border:  The edges of the growth are  irregular. C for color:  A mixture of colors are present instead of an even brown color. D for diameter:  Melanomas are usually (but not always) greater than 6mm - the size of a pencil eraser. E for evolution:  The spot keeps changing in size, shape, and color.  Please check your skin once per month between visits. You can use a small mirror in front and a large mirror behind you to keep an eye on the back side or your body.   If you see any new or changing lesions before your next follow-up, please call to schedule a visit.  Please continue daily skin protection including broad spectrum sunscreen SPF 30+ to sun-exposed areas, reapplying every 2 hours as needed when you're outdoors.   Staying in the shade or wearing long sleeves, sun glasses (UVA+UVB  protection) and wide brim hats (4-inch brim around the entire circumference of the hat) are also recommended for sun protection.    Due to recent changes in healthcare laws, you may see results of your pathology and/or laboratory studies on MyChart before the doctors have had a chance to review them. We understand that in some cases there may be results that are confusing or concerning to you. Please understand that not all results are received at the same time and often the doctors may need to interpret multiple results in order to provide you with the best plan of care or course of treatment. Therefore, we ask that you please give us  2 business days to thoroughly review all your results before contacting the office for clarification. Should we see a critical lab result, you will be contacted sooner.   If You Need Anything After Your Visit  If you have any questions or concerns for your doctor, please call our main line at [phone] and press option 4 to reach your doctor's medical assistant. If no one answers, please leave a voicemail as directed and we will return your call as soon as possible. Messages left after 4 pm will be answered the  following business day.   You may also send us  a message via MyChart. We typically respond to MyChart messages within 1-2 business days.  For prescription refills, please ask your pharmacy to contact our office. Our fax number is [phone].  If you have an urgent issue when the clinic is closed that cannot wait until the next business day, you can page your doctor at the number below.    Please note that while we do our best to be available for urgent issues outside of office hours, we are not available 24/7.   If you have an urgent issue and are unable to reach us , you may choose to seek medical care at your doctor's office, retail clinic, urgent care center, or emergency room.  If you have a medical emergency, please immediately call 911 or go to the emergency department.  Pager Numbers  - Dr. Hester: [phone]  - Dr. Jackquline: [phone]  - Dr. Claudene: [phone]   - Dr. Raymund: [phone]  In the event of inclement weather, please call our main line at [phone] for an update on the status of any delays or closures.  Dermatology Medication Tips: Please keep the boxes that topical medications come in in order to help keep track of the instructions about where and how to use these. Pharmacies typically print the medication instructions only on the boxes and not directly on the medication tubes.   If your medication is too expensive, please contact our office at [phone] option 4 or send us  a message through MyChart.   We are unable to tell what your co-pay for medications will be in advance as this is different depending on your insurance coverage. However, we may be able to find a substitute medication at lower cost or fill out paperwork to get insurance to cover a needed medication.   If a prior authorization is required to get your medication covered by your insurance company, please allow us  1-2 business days to complete this process.  Drug prices often vary  depending on where the prescription is filled and some pharmacies may offer cheaper prices.  The website www.goodrx.com contains coupons for medications through different pharmacies. The prices here do not account for what the cost may be with help from insurance (it may be cheaper  with your insurance), but the website can give you the price if you did not use any insurance.  - You can print the associated coupon and take it with your prescription to the pharmacy.  - You may also stop by our office during regular business hours and pick up a GoodRx coupon card.  - If you need your prescription sent electronically to a different pharmacy, notify our office through [facility] or by phone at [phone] option 4.     Si Usted Necesita Algo Despus de Su Visita  Tambin puede enviarnos un mensaje a travs de Clinical cytogeneticist. Por lo general respondemos a los mensajes de MyChart en el transcurso de 1 a 2 das hbiles.  Para renovar recetas, por favor pida a su farmacia que se ponga en contacto con nuestra oficina. Randi lakes de fax es [place] [phone].  Si tiene un asunto urgente cuando la clnica est cerrada y que no puede esperar hasta el siguiente da hbil, puede llamar/localizar a su doctor(a) al nmero que aparece a continuacin.   Por favor, tenga en cuenta que aunque hacemos todo lo posible para estar disponibles para asuntos urgentes fuera del horario de [place], no estamos disponibles las 24 horas del da, los 7 [address]  [address] de la [place].   Si tiene un problema urgente y no puede comunicarse con nosotros, puede optar por buscar atencin mdica  en el consultorio de su doctor(a), en una clnica privada, en un centro de atencin urgente o en una sala de emergencias.  Si tiene Engineer, drilling, por favor llame inmediatamente al 911 o vaya a la sala de emergencias.  Nmeros de bper  - Dr. Hester: [phone]  - Dra. Jackquline: [phone]  - Dr. Claudene: [phone]  - Dra.  Kitts: [phone]  En caso de inclemencias del [place], por favor llame a nuestra lnea principal al [phone] para una actualizacin sobre el estado de cualquier retraso o cierre.  Consejos para la medicacin en dermatologa: Por favor, guarde las cajas en las que vienen los medicamentos de uso tpico para ayudarle a seguir las instrucciones sobre dnde y cmo usarlos. Las farmacias generalmente imprimen las instrucciones del medicamento slo en las cajas y no directamente en los tubos del [place].   Si su medicamento es muy caro, por favor, pngase en contacto con landry rieger llamando al [phone] y presione la opcin 4 o envenos un mensaje a travs de Clinical cytogeneticist.   No podemos decirle cul ser su copago por los medicamentos por adelantado ya que esto es diferente dependiendo de la cobertura de su seguro. Sin embargo, es posible que podamos encontrar un medicamento sustituto a Audiological scientist un formulario para que el seguro cubra el medicamento que se considera necesario.   Si se requiere una autorizacin previa para que su compaa de seguros malta su medicamento, por favor permtanos de 1 a 2 das hbiles para completar este proceso.  Los precios de los medicamentos varan con frecuencia dependiendo del Environmental consultant de dnde se surte la receta y alguna farmacias pueden ofrecer precios ms baratos.  El sitio web www.goodrx.com tiene cupones para medicamentos de Health and safety inspector. Los precios aqu no tienen en cuenta lo que podra costar con la ayuda del seguro (puede ser ms barato con su seguro), pero el sitio web puede darle el precio si no utiliz Tourist information centre manager.  - Puede imprimir el cupn correspondiente y llevarlo con su receta a la farmacia.  - Tambin puede pasar por nuestra oficina durante el horario de atencin  regular y recoger una tarjeta de cupones de GoodRx.  - Si necesita que su receta se enve electrnicamente a una farmacia diferente, informe a nuestra oficina a  travs de MyChart de [place] o por telfono llamando al [phone] y presione la opcin 4.

## 2024-06-25 ENCOUNTER — Encounter: Payer: Self-pay | Admitting: Dermatology

## 2024-06-25 ENCOUNTER — Ambulatory Visit: Payer: Self-pay | Admitting: Dermatology

## 2024-06-25 LAB — SURGICAL PATHOLOGY

## 2024-06-25 NOTE — Telephone Encounter (Addendum)
 Called and discussed bx results with patient. He verbalized understanding and denied further questions. Will recheck area at next follow up----- Message from Alm Rhyme sent at 06/25/2024  5:40 PM EDT ----- FINAL DIAGNOSIS        1. Skin, left zygomatic area :       BASAL CELL CARCINOMA WITH FOCAL SCLEROSIS, ULCERATED, SEE DESCRIPTION    Cancer = BCC Already treated Recheck next visit Return if any evidence of re-growth ----- Message ----- From: Interface, Lab In Three Zero One Sent: 06/25/2024   5:34 PM EDT To: Alm JAYSON Rhyme, MD

## 2024-07-04 ENCOUNTER — Other Ambulatory Visit: Payer: Self-pay

## 2024-07-16 DIAGNOSIS — Z7960 Long term (current) use of unspecified immunomodulators and immunosuppressants: Secondary | ICD-10-CM | POA: Diagnosis not present

## 2024-07-16 DIAGNOSIS — M150 Primary generalized (osteo)arthritis: Secondary | ICD-10-CM | POA: Diagnosis not present

## 2024-07-16 DIAGNOSIS — M1120 Other chondrocalcinosis, unspecified site: Secondary | ICD-10-CM | POA: Diagnosis not present

## 2024-08-24 NOTE — Progress Notes (Unsigned)
 Cardiology Office Note    Date:  08/25/2024   ID:  Jerry Welch, DOB [birth year], MRN [id number]  PCP:  Valora Lynwood FALCON, MD  Cardiologist:  None  Electrophysiologist:  None   Chief Complaint: Follow up  History of Present Illness:   Jerry Welch is a 73 y.o. male with history of hypertension, hyperlipidemia, obesity, prior tobacco use, migraines, osteoarthritis, and prostate cancer (resection 2018) who presents for follow up on hypertension and hyperlipidemia.     Patient established with Dr. Gollan in 2012 for evaluation of hypertension and shortness of breath. Has chronic lower extremity swelling, left worse than right, which is presumed secondary to CCB use but prior attempts to wean of verapamil  were unsuccessful. Preferred not to wear compression stockings.    Patient was most recently seen in our office by Dr. Gollan 04/02/2023 and overall doing well from a cardiac perspective. Reported chronic leg swelling which was not bothersome. No further testing or medication changes were indicated at that time.   Patient presents today overall doing well from a cardiac perspective.  He reports staying busy during the days with activities like golfing, cooking, and taking care of his grandchildren.  No structured activity.  He denies exertional chest pain and dyspnea.  Blood pressure noted to be elevated in office today.  Patient reports that he did not have time to take his medications before his appointment this morning.  He has a blood pressure cuff at home but only checks it occasionally.  He reports home readings in the 140s systolic.  He has difficulty with medication compliance.  He reports that some of his medications he is supposed to take twice daily he just takes both pills in the morning.  He continues to have mild intermittent lower extremity swelling.  He denies chest pain, shortness of breath, lightheadedness, orthopnea, and PND.  Labs independently reviewed: 06/2024-  Hgb 15.7, HCT 46.2, platelets 226, Cr 1.1 09/2023- TC 114, TG 106, HDL 39, LDL 54, sodium 141, potassium 3.7  Objective   Past Medical History:  Diagnosis Date   Back pain    Basal cell carcinoma 05/17/2020   Right top of shoulder. Nodular pattern. EDC   Basal cell carcinoma 05/17/2020   Left inferior scapula. Nodular pattern. [facility] 07/27/20   Basal cell carcinoma 02/17/2021   L neck post auricular - ED&C    BCC (basal cell carcinoma of skin) 06/24/2024   left zygomatic area - ED&C done. - will recheck at follow up   Dysplastic nevus 05/17/2020   Left mid to upper back 6cm lat to spine. Mild atypia, limited margins free.    Dysplastic nevus 08/10/2020   left pubic area, mild atypia, limited margins free   GERD (gastroesophageal reflux disease)    History of hiatal hernia    HLD (hyperlipidemia) 04/30/2015   Hypertension    Melanoma (HCC) 05/17/2020   Mid to upper back, left paraspinal. MMIS, lateral margin involved. exc 07/20/20, margins free.   Osteoarthrosis involving multiple sites    Prostate cancer (HCC) 2019   prostatectomy   Ruptured lumbar disc    L4 & L5   Sleep apnea     Current Medications: Current Meds  Medication Sig   aspirin EC 81 MG tablet Take 81 mg by mouth daily.   atorvastatin (LIPITOR) 40 MG tablet Take 40 mg by mouth daily.   cyanocobalamin (,VITAMIN B-12,) 1000 MCG/ML injection Inject 1,000 mcg into the muscle every 14 (fourteen) days.  diclofenac  Sodium (VOLTAREN ) 1 % GEL Apply 2 g topically 4 (four) times daily.   doxazosin  (CARDURA ) 2 MG tablet Take 1 tablet (2 mg total) by mouth 2 (two) times daily.   fluticasone (FLONASE) 50 MCG/ACT nasal spray Place 1 spray into both nostrils 2 (two) times daily.   folic acid (FOLVITE) 1 MG tablet Take 1 mg by mouth daily.   hydroxychloroquine (PLAQUENIL) 200 MG tablet Take 1 tablet by mouth 2 (two) times daily.   loratadine (CLARITIN) 10 MG tablet Take 10 mg by mouth daily.   losartan -hydrochlorothiazide   (HYZAAR) 100-25 MG tablet Take 1 tablet by mouth daily. for high blood pressure   naproxen  sodium (ANAPROX ) 220 MG tablet Take 440 mg by mouth daily as needed (pain).   omeprazole (PRILOSEC) 40 MG capsule Take 40 mg by mouth daily.   potassium chloride  (KLOR-CON ) 10 MEQ tablet Take 10 mEq by mouth 3 (three) times daily.   valsartan -hydrochlorothiazide  (DIOVAN -HCT) 320-25 MG tablet Take 1 tablet by mouth daily.   verapamil  (CALAN -SR) 240 MG CR tablet Take 240 mg by mouth 2 (two) times daily.    Allergies:   Amlodipine and Lisinopril   Social History   Socioeconomic History   Marital status: Married    Spouse name: Not on file   Number of children: Not on file   Years of education: Not on file   Highest education level: Not on file  Occupational History   Not on file  Tobacco Use   Smoking status: Former    Current packs/day: 0.00    Types: Cigarettes    Quit date: 06/06/1981    Years since quitting: 43.2   Smokeless tobacco: Former  Building Services Engineer status: Never Used  Substance and Sexual Activity   Alcohol use: Yes    Alcohol/week: 1.0 standard drink of alcohol    Types: 1 Standard drinks or equivalent per week   Drug use: No   Sexual activity: Yes    Birth control/protection: None  Other Topics Concern   Not on file  Social History Narrative   Not on file   Social Drivers of Health   Financial Resource Strain: Patient Declined (10/31/2023)   Received from [facility]   Overall Financial Resource Strain (CARDIA)    Difficulty of Paying Living Expenses: Patient declined  Food Insecurity: Patient Declined (10/31/2023)   Received from [facility]   Hunger Vital Sign    Within the past 12 months, you worried that your food would run out before you got the money to buy more.: Patient declined    Within the past 12 months, the food you bought just didn't last and you didn't have money to get more.: Patient declined  Transportation  Needs: Patient Declined (10/31/2023)   Received from [facility] - Transportation    In the past 12 months, has lack of transportation kept you from medical appointments or from getting medications?: Patient declined    Lack of Transportation (Non-Medical): Patient declined  Physical Activity: Not on file  Stress: Not on file  Social Connections: Not on file     Family History:  The patient's family history includes Heart attack in his maternal grandfather; Heart failure in his mother. There is no history of Bladder Cancer, Prostate cancer, or Kidney cancer.  ROS:   12-point review of systems is negative unless otherwise noted in the HPI.  EKGs/Other Studies Reviewed:     EKG:  EKG personally reviewed by me today EKG Interpretation Date/Time:  Monday August 25 2024 10:12:24 EST Ventricular Rate:  65 PR Interval:  166 QRS Duration:  98 QT Interval:  438 QTC Calculation: 455 R Axis:   -20  Text Interpretation: Sinus rhythm with Premature atrial complexes Confirmed by Lorene Sinclair ([id number]) on 08/25/2024 10:16:23 AM  PHYSICAL EXAM:    VS:  BP (!) 152/96   Pulse 65   Ht 5' 9 (1.753 m)   Wt 198 lb (89.8 kg)   SpO2 94%   BMI 29.24 kg/m   BMI: Body mass index is 29.24 kg/m.  GEN: Well nourished, well developed in no acute distress NECK: No JVD; No carotid bruits CARDIAC: RRR, no murmurs, rubs, gallops RESPIRATORY:  Clear to auscultation without rales, wheezing or rhonchi  ABDOMEN: Soft, non-tender, non-distended EXTREMITIES: Trace LE edema bilaterally; No deformity  Wt Readings from Last 3 Encounters:  08/25/24 198 lb (89.8 kg)  05/08/24 190 lb 11.2 oz (86.5 kg)  10/08/23 209 lb (94.8 kg)                  ASSESSMENT & PLAN:   Hypertension - BP elevated in office today.  Patient did not take his medications this morning.  Reports home readings in the 140s systolic.  His medication list has both valsartan -HCTZ and losartan -HCTZ.  He is  unsure which of these he is taking.  He did not bring his medications with him and plans to drop off his medication list tomorrow.  Recommend keeping a blood pressure log which we will supply him with today.  Encouraged strict medication compliance.  He follows up with his PCP next month and medications can be titrated if indicated at that time.  Could consider addition of hydralazine  3 times daily, although compliance may be an issue.  Hyperlipidemia - Most recent lipid panel 09/2023 with LDL 54, at goal. He is continued on atorvastatin 40 mg daily.   Lower extremity swelling - Chronic and stable, likely secondary to calcium channel blocker use.   Disposition: Keep BP log. F/u with PCP for further management of BP. F/u with Dr. Gollan or an APP in 1 year.   Medication Adjustments/Labs and Tests Ordered: Current medicines are reviewed at length with the patient today.  Concerns regarding medicines are outlined above. Medication changes, Labs and Tests ordered today are summarized above and listed in the Patient Instructions accessible in Encounters.   Bonney Sinclair Lorene, PA-C 08/25/2024 10:35 AM     [facility] [place] [address] [place], [place] [address] [phone]

## 2024-08-25 ENCOUNTER — Encounter: Payer: Self-pay | Admitting: Physician Assistant

## 2024-08-25 ENCOUNTER — Ambulatory Visit: Attending: Physician Assistant | Admitting: Physician Assistant

## 2024-08-25 VITALS — BP 142/96 | HR 65 | Ht 69.0 in | Wt 198.0 lb

## 2024-08-25 DIAGNOSIS — I10 Essential (primary) hypertension: Secondary | ICD-10-CM | POA: Diagnosis not present

## 2024-08-25 DIAGNOSIS — E782 Mixed hyperlipidemia: Secondary | ICD-10-CM | POA: Diagnosis not present

## 2024-08-25 DIAGNOSIS — M7989 Other specified soft tissue disorders: Secondary | ICD-10-CM | POA: Diagnosis not present

## 2024-08-25 NOTE — Patient Instructions (Signed)
 Medication Instructions:  Your physician recommends that you continue on your current medications as directed. Please refer to the Current Medication list given to you today.  *If you need a refill on your cardiac medications before your next appointment, please call your pharmacy*  Lab Work: No labs ordered today  If you have labs (blood work) drawn today and your tests are completely normal, you will receive your results only by: MyChart Message (if you have MyChart) OR A paper copy in the mail If you have any lab test that is abnormal or we need to change your treatment, we will call you to review the results.  Testing/Procedures: No test ordered today   Follow-Up: At [facility], you and your health needs are our priority.  As part of our continuing mission to provide you with exceptional heart care, our providers are all part of one team.  This team includes your primary Cardiologist (physician) and Advanced Practice Providers or APPs (Physician Assistants and Nurse Practitioners) who all work together to provide you with the care you need, when you need it.  Your next appointment:   1 year(s)  Provider:   You may see  one of the following Advanced Practice Providers on your designated Care Team:   Lonni Meager, NP Lesley Maffucci, PA-C Bernardino Bring, PA-C Cadence Napoleon, PA-C Tylene Lunch, NP Barnie Hila, NP    Please keep a list of your Blood pressure on your blood pressure log

## 2024-08-26 ENCOUNTER — Other Ambulatory Visit: Payer: Self-pay

## 2024-08-26 MED ORDER — FOLIC ACID 1 MG PO TABS
1.0000 mg | ORAL_TABLET | Freq: Every day | ORAL | 3 refills | Status: AC
  Filled 2024-08-26: qty 90, 90d supply, fill #0

## 2024-12-24 ENCOUNTER — Ambulatory Visit: Admitting: Dermatology
